# Patient Record
Sex: Female | Born: 1935 | Race: Black or African American | Hispanic: No | Marital: Single | Smoking: Former smoker
Health system: Southern US, Community
[De-identification: ages and names within clinical notes are randomized; demographics above are authoritative.]

## PROBLEM LIST (undated history)

## (undated) DIAGNOSIS — E119 Type 2 diabetes mellitus without complications: Secondary | ICD-10-CM

## (undated) DIAGNOSIS — F0390 Unspecified dementia without behavioral disturbance: Secondary | ICD-10-CM

## (undated) DIAGNOSIS — I10 Essential (primary) hypertension: Secondary | ICD-10-CM

---

## 2013-04-05 DIAGNOSIS — I422 Other hypertrophic cardiomyopathy: Secondary | ICD-10-CM | POA: Insufficient documentation

## 2017-01-17 DIAGNOSIS — E785 Hyperlipidemia, unspecified: Secondary | ICD-10-CM | POA: Diagnosis present

## 2018-02-20 DIAGNOSIS — E11319 Type 2 diabetes mellitus with unspecified diabetic retinopathy without macular edema: Secondary | ICD-10-CM | POA: Diagnosis present

## 2018-02-20 DIAGNOSIS — I10 Essential (primary) hypertension: Secondary | ICD-10-CM | POA: Insufficient documentation

## 2018-05-29 DIAGNOSIS — E0842 Diabetes mellitus due to underlying condition with diabetic polyneuropathy: Secondary | ICD-10-CM | POA: Insufficient documentation

## 2019-01-15 DIAGNOSIS — D631 Anemia in chronic kidney disease: Secondary | ICD-10-CM | POA: Diagnosis present

## 2019-06-14 DIAGNOSIS — M4800 Spinal stenosis, site unspecified: Secondary | ICD-10-CM | POA: Diagnosis present

## 2020-10-10 ENCOUNTER — Inpatient Hospital Stay (HOSPITAL_COMMUNITY)
Admission: EM | Admit: 2020-10-10 | Discharge: 2020-10-15 | DRG: 638 | Disposition: A | Discharge status: Skilled Nursing Facility | Payer: Medicare Other | Attending: Internal Medicine | Admitting: Internal Medicine

## 2020-10-10 ENCOUNTER — Other Ambulatory Visit: Payer: Self-pay

## 2020-10-10 DIAGNOSIS — F0390 Unspecified dementia without behavioral disturbance: Secondary | ICD-10-CM | POA: Diagnosis present

## 2020-10-10 DIAGNOSIS — I129 Hypertensive chronic kidney disease with stage 1 through stage 4 chronic kidney disease, or unspecified chronic kidney disease: Secondary | ICD-10-CM | POA: Diagnosis present

## 2020-10-10 DIAGNOSIS — Z7982 Long term (current) use of aspirin: Secondary | ICD-10-CM

## 2020-10-10 DIAGNOSIS — N390 Urinary tract infection, site not specified: Secondary | ICD-10-CM | POA: Diagnosis present

## 2020-10-10 DIAGNOSIS — Z66 Do not resuscitate: Secondary | ICD-10-CM | POA: Diagnosis present

## 2020-10-10 DIAGNOSIS — R739 Hyperglycemia, unspecified: Secondary | ICD-10-CM

## 2020-10-10 DIAGNOSIS — F03918 Unspecified dementia, unspecified severity, with other behavioral disturbance: Secondary | ICD-10-CM | POA: Diagnosis present

## 2020-10-10 DIAGNOSIS — E785 Hyperlipidemia, unspecified: Secondary | ICD-10-CM | POA: Diagnosis present

## 2020-10-10 DIAGNOSIS — Z823 Family history of stroke: Secondary | ICD-10-CM

## 2020-10-10 DIAGNOSIS — R011 Cardiac murmur, unspecified: Secondary | ICD-10-CM | POA: Diagnosis present

## 2020-10-10 DIAGNOSIS — G8929 Other chronic pain: Secondary | ICD-10-CM | POA: Diagnosis present

## 2020-10-10 DIAGNOSIS — E1142 Type 2 diabetes mellitus with diabetic polyneuropathy: Secondary | ICD-10-CM | POA: Diagnosis present

## 2020-10-10 DIAGNOSIS — E1122 Type 2 diabetes mellitus with diabetic chronic kidney disease: Secondary | ICD-10-CM | POA: Diagnosis present

## 2020-10-10 DIAGNOSIS — N179 Acute kidney failure, unspecified: Secondary | ICD-10-CM | POA: Diagnosis present

## 2020-10-10 DIAGNOSIS — R8281 Pyuria: Secondary | ICD-10-CM

## 2020-10-10 DIAGNOSIS — E1165 Type 2 diabetes mellitus with hyperglycemia: Principal | ICD-10-CM | POA: Diagnosis present

## 2020-10-10 DIAGNOSIS — Z79899 Other long term (current) drug therapy: Secondary | ICD-10-CM

## 2020-10-10 DIAGNOSIS — N1832 Chronic kidney disease, stage 3b: Secondary | ICD-10-CM | POA: Diagnosis present

## 2020-10-10 DIAGNOSIS — I421 Obstructive hypertrophic cardiomyopathy: Secondary | ICD-10-CM | POA: Diagnosis present

## 2020-10-10 DIAGNOSIS — R112 Nausea with vomiting, unspecified: Secondary | ICD-10-CM

## 2020-10-10 DIAGNOSIS — Z20822 Contact with and (suspected) exposure to covid-19: Secondary | ICD-10-CM | POA: Diagnosis present

## 2020-10-10 DIAGNOSIS — E861 Hypovolemia: Secondary | ICD-10-CM | POA: Diagnosis present

## 2020-10-10 DIAGNOSIS — R001 Bradycardia, unspecified: Secondary | ICD-10-CM | POA: Diagnosis present

## 2020-10-10 DIAGNOSIS — R319 Hematuria, unspecified: Secondary | ICD-10-CM | POA: Diagnosis present

## 2020-10-10 DIAGNOSIS — T383X6A Underdosing of insulin and oral hypoglycemic [antidiabetic] drugs, initial encounter: Secondary | ICD-10-CM | POA: Diagnosis present

## 2020-10-10 DIAGNOSIS — N183 Chronic kidney disease, stage 3 unspecified: Secondary | ICD-10-CM | POA: Diagnosis present

## 2020-10-10 DIAGNOSIS — M5416 Radiculopathy, lumbar region: Secondary | ICD-10-CM | POA: Diagnosis present

## 2020-10-10 LAB — COMPREHENSIVE METABOLIC PANEL
ALT: 19 U/L (ref 0–44)
AST: 18 U/L (ref 15–41)
Albumin: 3.6 g/dL (ref 3.5–5.0)
Alkaline Phosphatase: 87 U/L (ref 38–126)
Anion gap: 13 (ref 5–15)
BUN: 21 mg/dL (ref 8–23)
CO2: 22 mmol/L (ref 22–32)
Calcium: 9.7 mg/dL (ref 8.9–10.3)
Chloride: 95 mmol/L — ABNORMAL LOW (ref 98–111)
Creatinine, Ser: 1.63 mg/dL — ABNORMAL HIGH (ref 0.44–1.00)
GFR, Estimated: 31 mL/min — ABNORMAL LOW (ref >60)
Glucose, Bld: 641 mg/dL (ref 70–99)
Potassium: 4.5 mmol/L (ref 3.5–5.1)
Sodium: 130 mmol/L — ABNORMAL LOW (ref 135–145)
Total Bilirubin: 0.6 mg/dL (ref 0.3–1.2)
Total Protein: 7.4 g/dL (ref 6.5–8.1)

## 2020-10-10 LAB — CBC WITH DIFFERENTIAL/PLATELET
Abs Immature Granulocytes: 0.06 10*3/uL (ref 0.00–0.07)
Basophils Absolute: 0 10*3/uL (ref 0.0–0.1)
Basophils Relative: 0 %
Eosinophils Absolute: 0.1 10*3/uL (ref 0.0–0.5)
Eosinophils Relative: 2 %
HCT: 42.6 % (ref 36.0–46.0)
Hemoglobin: 13.6 g/dL (ref 12.0–15.0)
Immature Granulocytes: 1 %
Lymphocytes Relative: 22 %
Lymphs Abs: 1.5 10*3/uL (ref 0.7–4.0)
MCH: 28.3 pg (ref 26.0–34.0)
MCHC: 31.9 g/dL (ref 30.0–36.0)
MCV: 88.6 fL (ref 80.0–100.0)
Monocytes Absolute: 0.7 10*3/uL (ref 0.1–1.0)
Monocytes Relative: 11 %
Neutro Abs: 4.6 10*3/uL (ref 1.7–7.7)
Neutrophils Relative %: 64 %
Platelets: 270 10*3/uL (ref 150–400)
RBC: 4.81 MIL/uL (ref 3.87–5.11)
RDW: 13.5 % (ref 11.5–15.5)
WBC: 7 10*3/uL (ref 4.0–10.5)
nRBC: 0 % (ref 0.0–0.2)

## 2020-10-10 LAB — URINALYSIS, ROUTINE W REFLEX MICROSCOPIC
Bilirubin Urine: NEGATIVE
Glucose, UA: >=500 mg/dL — AB
Hgb urine dipstick: NEGATIVE
Ketones, ur: NEGATIVE mg/dL
Nitrite: NEGATIVE
Protein, ur: NEGATIVE mg/dL
RBC / HPF: >50 RBC/hpf — ABNORMAL HIGH (ref 0–5)
Specific Gravity, Urine: 1.025 (ref 1.005–1.030)
WBC, UA: >50 WBC/hpf — ABNORMAL HIGH (ref 0–5)
pH: 5 (ref 5.0–8.0)

## 2020-10-10 LAB — GLUCOSE, CAPILLARY
Glucose-Capillary: 210 mg/dL — ABNORMAL HIGH (ref 70–99)
Glucose-Capillary: 211 mg/dL — ABNORMAL HIGH (ref 70–99)

## 2020-10-10 LAB — HEMOGLOBIN A1C
Hgb A1c MFr Bld: 10.8 % — ABNORMAL HIGH (ref 4.8–5.6)
Mean Plasma Glucose: 263.26 mg/dL

## 2020-10-10 LAB — CBG MONITORING, ED
Glucose-Capillary: 234 mg/dL — ABNORMAL HIGH (ref 70–99)
Glucose-Capillary: 119 mg/dL — ABNORMAL HIGH (ref 70–99)
Glucose-Capillary: 107 mg/dL — ABNORMAL HIGH (ref 70–99)
Glucose-Capillary: 231 mg/dL — ABNORMAL HIGH (ref 70–99)

## 2020-10-10 LAB — RESP PANEL BY RT-PCR (FLU A&B, COVID) ARPGX2
Influenza A by PCR: NEGATIVE
Influenza B by PCR: NEGATIVE
SARS Coronavirus 2 by RT PCR: NEGATIVE

## 2020-10-10 MED ORDER — SODIUM CHLORIDE 0.9 % IV BOLUS
500.0000 mL | Freq: Once | INTRAVENOUS | Status: AC
  Administered 2020-10-10: 500 mL via INTRAVENOUS

## 2020-10-10 MED ORDER — DULOXETINE HCL 60 MG PO CPEP
60.0000 mg | ORAL_CAPSULE | Freq: Every day | ORAL | Status: DC
  Administered 2020-10-10 – 2020-10-15 (×6): 60 mg via ORAL
  Filled 2020-10-10 (×6): qty 1

## 2020-10-10 MED ORDER — CEPHALEXIN 250 MG PO CAPS
500.0000 mg | ORAL_CAPSULE | Freq: Once | ORAL | Status: AC
  Administered 2020-10-10: 500 mg via ORAL
  Filled 2020-10-10: qty 2

## 2020-10-10 MED ORDER — LACTATED RINGERS IV SOLN: INTRAVENOUS | Status: DC

## 2020-10-10 MED ORDER — SODIUM CHLORIDE 0.9 % IV SOLN
1.0000 g | Freq: Once | INTRAVENOUS | Status: AC
  Administered 2020-10-10: 1 g via INTRAVENOUS
  Filled 2020-10-10: qty 10

## 2020-10-10 MED ORDER — ACETAMINOPHEN 650 MG RE SUPP: 650.0000 mg | Freq: Four times a day (QID) | RECTAL | Status: DC | PRN

## 2020-10-10 MED ORDER — ENOXAPARIN SODIUM 30 MG/0.3ML ~~LOC~~ SOLN
30.0000 mg | SUBCUTANEOUS | Status: DC
  Administered 2020-10-10 – 2020-10-15 (×6): 30 mg via SUBCUTANEOUS
  Filled 2020-10-10 (×6): qty 0.3

## 2020-10-10 MED ORDER — HYDRALAZINE HCL 25 MG PO TABS
25.0000 mg | ORAL_TABLET | Freq: Three times a day (TID) | ORAL | Status: DC
  Administered 2020-10-10 – 2020-10-15 (×17): 25 mg via ORAL
  Filled 2020-10-10 (×17): qty 1

## 2020-10-10 MED ORDER — INSULIN ASPART 100 UNIT/ML ~~LOC~~ SOLN
15.0000 [IU] | Freq: Once | SUBCUTANEOUS | Status: AC
  Administered 2020-10-10: 15 [IU] via SUBCUTANEOUS

## 2020-10-10 MED ORDER — ONDANSETRON HCL 4 MG PO TABS: 4.0000 mg | ORAL_TABLET | Freq: Four times a day (QID) | ORAL | Status: DC | PRN

## 2020-10-10 MED ORDER — ONDANSETRON HCL 4 MG/2ML IJ SOLN: 4.0000 mg | Freq: Four times a day (QID) | INTRAMUSCULAR | Status: DC | PRN

## 2020-10-10 MED ORDER — FENTANYL CITRATE (PF) 100 MCG/2ML IJ SOLN: 50.0000 ug | Freq: Once | INTRAMUSCULAR | Status: DC

## 2020-10-10 MED ORDER — LOSARTAN POTASSIUM 50 MG PO TABS: 100.0000 mg | ORAL_TABLET | Freq: Every day | ORAL | Status: DC

## 2020-10-10 MED ORDER — ACETAMINOPHEN 325 MG PO TABS
650.0000 mg | ORAL_TABLET | Freq: Four times a day (QID) | ORAL | Status: DC | PRN
  Administered 2020-10-10 – 2020-10-15 (×10): 650 mg via ORAL
  Filled 2020-10-10 (×11): qty 2

## 2020-10-10 MED ORDER — INSULIN ASPART 100 UNIT/ML ~~LOC~~ SOLN
0.0000 [IU] | Freq: Three times a day (TID) | SUBCUTANEOUS | Status: DC
  Administered 2020-10-10: 5 [IU] via SUBCUTANEOUS
  Administered 2020-10-11: 3 [IU] via SUBCUTANEOUS
  Administered 2020-10-11: 8 [IU] via SUBCUTANEOUS
  Administered 2020-10-11: 2 [IU] via SUBCUTANEOUS
  Administered 2020-10-12: 8 [IU] via SUBCUTANEOUS
  Administered 2020-10-12: 3 [IU] via SUBCUTANEOUS
  Administered 2020-10-12 – 2020-10-13 (×2): 11 [IU] via SUBCUTANEOUS
  Administered 2020-10-13: 3 [IU] via SUBCUTANEOUS
  Administered 2020-10-14 (×2): 11 [IU] via SUBCUTANEOUS
  Administered 2020-10-14 – 2020-10-15 (×2): 3 [IU] via SUBCUTANEOUS
  Administered 2020-10-15: 11 [IU] via SUBCUTANEOUS
  Administered 2020-10-15: 5 [IU] via SUBCUTANEOUS

## 2020-10-10 MED ORDER — ATORVASTATIN CALCIUM 40 MG PO TABS
40.0000 mg | ORAL_TABLET | Freq: Every evening | ORAL | Status: DC
  Administered 2020-10-10 – 2020-10-15 (×6): 40 mg via ORAL
  Filled 2020-10-10 (×6): qty 1

## 2020-10-10 MED ORDER — ONDANSETRON HCL 4 MG/2ML IJ SOLN
4.0000 mg | Freq: Once | INTRAMUSCULAR | Status: AC
  Administered 2020-10-10: 4 mg via INTRAVENOUS
  Filled 2020-10-10: qty 2

## 2020-10-10 MED ORDER — CARVEDILOL 12.5 MG PO TABS: 6.2500 mg | ORAL_TABLET | Freq: Two times a day (BID) | ORAL | Status: DC

## 2020-10-10 MED ORDER — CIPROFLOXACIN HCL 500 MG PO TABS
500.0000 mg | ORAL_TABLET | Freq: Every day | ORAL | Status: DC
  Administered 2020-10-11: 500 mg via ORAL
  Filled 2020-10-10: qty 1

## 2020-10-10 MED ORDER — SPIRONOLACTONE 25 MG PO TABS: 50.0000 mg | ORAL_TABLET | Freq: Every day | ORAL | Status: DC

## 2020-10-10 MED ORDER — METOCLOPRAMIDE HCL 5 MG/ML IJ SOLN: 10.0000 mg | INTRAMUSCULAR | Status: DC

## 2020-10-10 NOTE — H&P (Signed)
NAME:  BRYLEY CHRISMAN, MRN:  [id number], DOB:  10/18/1936, LOS: 0 ADMISSION DATE:  10/10/2020, Primary: Patient, No Pcp Per  CHIEF COMPLAINT: Urinary frequency and urgency  Medical Service: Internal Medicine Teaching Service         Attending Physician: Dr. Rebeca Alert Raynaldo Opitz, MD    First Contact: Dr. Darrick Meigs Pager: [phone]  Second Contact: Dr. Alfonse Spruce Pager: [phone]       After Hours (After 5p/  First Contact Pager: [phone]  weekends / holidays): Second Contact Pager: [phone]    History of present illness   Mckaylee Dimalanta is a 84 year old female with past medical history of type 2 diabetes, hypertension, hyperlipidemia, lumbar radiculopathy, seizure, CKD 3, peripheral neuropathy who presents to the ED for polyuria and polydipsia for the past week.  Her friend Shelbie Proctor, also her POA, states that patient can drink up to 10 bottles of Gatorade a day.  Sedation also use the bathroom so frequently that she has to wear a pad.  She denies dysuria or burning sensation with urination or suprapubic tenderness.  Also denies chest pain, shortness of breath.  Complains abdominal pain in the past but not right now.  Per Shelbie Proctor, patient has not not been taking her Trulicity at home for a long time.  Patient states that they has not sent the medication to her house after the hospitalization September.  Patient also complains of generalized weakness that has been going on for more than 1 month, worse in her legs, and denies focal neurological deficits.  She also complains of nausea that has been going on for 1 month, and has gotten progressively worse in the last 2 days.  She has vomited 4 times in the ED but is unsure if her emesis is bloody.  Some of the history was obtained from [place].  She states that patient is from [place] and was moved to [place] recently to live with her.  Patient lives in the independent living facility with 12 hours of care every day.  However, patient still requires a lot  of assistance for daily activities.  Patient refuses to ambulate and require assistance from [place].  Patient's memory has also been worse per [place].  Her last PCP visit was just a few days ago at [facility].  They did not have time to discuss with the doctor about her medications because it was late to their appointment.  And states that her new PCP has put in order order for physical therapy.  Per chart review, patient was admitted to [facility] on 9/22/121 for bradycardia and syncopal episode.  CT head and CT cervical spine were unremarkable.  Her Coreg was decreased from 25 to 6.25 mg daily, losartan decreased 100 mg of 50 mg daily, and hydralazine 25 mg which continue.  In the ED, UA shows large leukocytes, with hematuria, pyuria and rare bacteria.  Glucose admission was 641 and improved to 119 after 15 units of NovoLog.  Past Medical History  HTN, HLD, T2DM with peripheral neuropathy  Home Medications     Prior to Admission medications   Medication Sig Start Date End Date Taking? Authorizing Provider  aspirin 81 MG EC tablet Take 1 tablet by mouth daily. 04/20/16  Yes [provider]  atorvastatin (LIPITOR) 20 MG tablet Take 40 mg by mouth every evening. 07/28/16  Yes [provider]  calcium carbonate (OSCAL) 1500 (600 Ca) MG TABS tablet Take 1 tablet by mouth 2 (two) times daily.   Yes [provider]  carvedilol (COREG) 25 MG tablet Take 1 tablet by mouth in the morning and at bedtime.  08/07/20  Yes [provider]  Cholecalciferol 50 MCG (2000 UT) CAPS Take 1 capsule by mouth daily.   Yes [provider]  Docusate Sodium (DSS) 100 MG CAPS Take 1 capsule by mouth daily. 06/03/20  Yes [provider]  donepezil (ARICEPT) 5 MG tablet Take 5 mg by mouth at bedtime.   Yes [provider]  DULoxetine (CYMBALTA) 60 MG capsule Take 1 capsule by mouth daily. 09/15/20  Yes [provider]  furosemide (LASIX) 20 MG tablet  Take 20 mg by mouth.   Yes [provider]  glipiZIDE (GLUCOTROL XL) 2.5 MG 24 hr tablet Take 2.5 mg by mouth daily. 01/01/19  Yes [provider]  hydrALAZINE (APRESOLINE) 25 MG tablet Take 25 mg by mouth 3 (three) times daily. 01/10/19  Yes [provider]  losartan (COZAAR) 100 MG tablet Take 1 tablet by mouth daily. 02/13/18  Yes [provider]  spironolactone (ALDACTONE) 50 MG tablet Take 1 tablet by mouth daily. 09/11/17  Yes [provider]  Dulaglutide 1.5 MG/0.5ML SOPN Inject 1.5 mg into the skin once a week. Every Friday Patient not taking: Reported on 10/10/2020    [provider]  oxyCODONE-acetaminophen (PERCOCET/ROXICET) 5-325 MG tablet Take 1 tablet by mouth 2 (two) times daily as needed. 06/06/19   [provider]    Allergies    Allergies as of 10/10/2020 - Review Complete 10/10/2020  Allergen Reaction Noted  . Penicillins  10/10/2020    Social History     Denies alcohol since December No cigarette smoking No drug use  Family History   Father: Stroke Mother: Tuberculosis  ROS  Negative unless stated in HPI  Objective   Blood pressure 135/66, pulse 66, temperature 97.9 F (36.6 C), temperature source Oral, resp. rate (!) 21, height 5\' 2"  (1.575 m), weight 74.4 kg, SpO2 97 %.    Filed Weights   10/10/20 0225  Weight: 74.4 kg    Examination: GENERAL: in no acute distress, pleasant appearance HEENT: head atraumatic. No conjunctival injection. Nares patent.  CARDIAC: heart RRR. No peripheral edema.  PULMONARY: acyanotic. Lung sounds clear to auscultation. ABDOMEN: soft. Nontender to palpation.  Nondistended.  Negative for CVA tenderness NEURO: PERRLA, normal EOM, CN II-XII  Intact Right UE: 5/5 strength Left UE: 4/5 strength Right and left LE:4-5/5 strength SKIN: She has a round, shallow-based, crusted lesion on the left ankle.  No purulent discharge, no erythematous, not warm to touch.  No other  wounds noted on bilateral feet PSYCH: A/Ox3. Normal affect   Labs    CBC Latest Ref Rng & Units 10/10/2020  WBC 4.0 - 10.5 K/uL 7.0  Hemoglobin 12.0 - 15.0 g/dL 13.6  Hematocrit 36 - 46 % 42.6  Platelets 150 - 400 K/uL 270   BMP Latest Ref Rng & Units 10/10/2020  Glucose 70 - 99 mg/dL 641(HH)  BUN 8 - 23 mg/dL 21  Creatinine 0.44 - 1.00 mg/dL 1.63(H)  Sodium 135 - 145 mmol/L 130(L)  Potassium 3.5 - 5.1 mmol/L 4.5  Chloride 98 - 111 mmol/L 95(L)  CO2 22 - 32 mmol/L 22  Calcium 8.9 - 10.3 mg/dL 9.7     Summary  45 yof presenting with dysuria, nausea and vomiting who is being admitted to IMTS for overnight observation to monitor volume status and manage hyperglycemia.  Assessment & Plan:  Active Problems:   UTI (urinary tract  infection)  Uncomplicated urinary tract infection. Afebrile, no leukocytosis. Received one dose of rocephin in ED Nausea/vomitting--no evidence of DKA.  Suspect gastroparesis in the setting of diabetes Polyuria/polydipsia-this is likely due to hypoglycemia event secondary to not taking her medications. Plan -Received one dose of rocephin in the ED but will transition to oral antibiotics tomorrow. Will have to use cipro (renally dosed) due to contraindication to Macrobid or bactrim given her renal function and documented allergy to penicillins. -follow urine culture -zofran for nausea -Will give her some IVF given poor oral intake due to n/v   Type II Diabetes Mellitus with hyperglycemia complicated by peripheral neuropathy. Glucose on admission is 641. Last A1C 6.1. reportedly non-compliant with trulicity and glipizide at home however given her last A1C, I question if the hyperglycemia is not a result of UTI. Glucose dropped quickly from [phone] with 15U novolog Plan -Will start with a moderate sliding scale for now given her n/v  -hold trulicity until we get a better sense of where her glucoses are. Recommend discontinuing glipizide -duloxetine  for diabetic neuropathy -recommend close outpatient follow up with PCP. A1C goal <8.   Hypertension/ hyperlipidemia.  Hx of bradycardia Plan: Continue lipitor, coreg, hydralazine, spironolactone. Of note, coreg recently decreased to 6.25mg  BID from 25mg  due to bradycardia and orthostasis. She is also on aspirin at home but unclear of the indication. -Will hold Coreg due to bradycardia.  Heart rate in the room was in the 40s-50s   Stage IV CKD. Baseline GFR around 28. Avoid nephrotoxic agents. Repeat BMP in AM to follow renal function. -Holding losartan, spironolactone and Lasix in the setting of AKI   Best practice:  CODE STATUS: DNR Diet: CM DVT for prophylaxis: lovenox Social considerations/Family communication: family updated at bedside Dispo: Admit patient to Observation with expected length of stay less than 2 midnights.   Gaylan Gerold, DO Internal Medicine Residency My pager: [phone]

## 2020-10-10 NOTE — ED Triage Notes (Signed)
Pt said she has been having urination issues and burning. Pt said pain is in her back area. Pt said urination frequency. Pt said no blood in urine. Pt said 7/10 pain

## 2020-10-10 NOTE — ED Notes (Signed)
Pt provided 2 packs graham crackers, 1 container peanut butter, 4 oz orange juice.

## 2020-10-10 NOTE — ED Notes (Signed)
PT in working with patient

## 2020-10-10 NOTE — ED Notes (Signed)
Pt lunch tray still not on unit

## 2020-10-10 NOTE — Evaluation (Signed)
Physical Therapy Evaluation Patient Details Name: Holly Valdez MRN: [id number] DOB: [birth year] Today's Date: 10/10/2020   History of Present Illness  84yo female c/o polyuria/polydipsia, general weakness, nausea/vomiting. Admitted with UTI. PMH HTN, HLD, DM with peripheral neuropathy  Clinical Impression   Patient received in bed, very pleasantly confused; very difficult to convince her to participate in mobility today. Eventually able to get her to roll side to side to change out damp linens beneath her; did attempt to get to EOB but will need +2 assist due to gross weakness and physical resistance. Left positioned to comfort on ED stretcher with all needs met, RN aware of patient status. In definite need of SNF moving forward.     Follow Up Recommendations SNF;Supervision/Assistance - 24 hour    Equipment Recommendations  Rolling walker with 5" wheels;3in1 (PT);Wheelchair (measurements PT);Wheelchair cushion (measurements PT)    Recommendations for Other Services       Precautions / Restrictions Precautions Precautions: Fall;Other (comment) Precaution Comments: chronic back pain Restrictions Weight Bearing Restrictions: No      Mobility  Bed Mobility Overal bed mobility: Needs Assistance Bed Mobility: Rolling Rolling: Min assist         General bed mobility comments: MinA to completely roll onto her side with use of rail, attempted supine to sit but she declined and physically resisted    Transfers                 General transfer comment: unable- will need +2  Ambulation/Gait             General Gait Details: deferred  Stairs            Wheelchair Mobility    Modified Rankin (Stroke Patients Only)       Balance Overall balance assessment: History of Falls                                           Pertinent Vitals/Pain Pain Assessment: Faces Faces Pain Scale: Hurts little more Pain Location: back pain Pain  Descriptors / Indicators: Aching;Discomfort Pain Intervention(s): Limited activity within patient's tolerance;Monitored during session    Home Living Family/patient expects to be discharged to:: Private residence Living Arrangements: Alone Available Help at Discharge: Family;Other (Comment);Neighbor (has 2 brothers- one lives in [place], one lives in [place]; neighbors are wonderful and can help her as needed) Type of Home: House Home Access: Stairs to enter Entrance Stairs-Rails: Left Entrance Stairs-Number of Steps: L ascending rail Home Layout: One level Home Equipment: Grab bars - tub/shower;Walker - 4 wheels;Cane - quad Additional Comments: has had 4 falls in the past 3 months    Prior Function Level of Independence: Independent with assistive device(s)         Comments: tells me this information, however per caregiver in chart she lives in an independent living faciilty and needs 12 hours of care per day/refuses to ambulate with staff     Hand Dominance        Extremity/Trunk Assessment   Upper Extremity Assessment Upper Extremity Assessment: Generalized weakness    Lower Extremity Assessment Lower Extremity Assessment: Generalized weakness    Cervical / Trunk Assessment Cervical / Trunk Assessment: Kyphotic  Communication   Communication: HOH  Cognition Arousal/Alertness: Awake/alert Behavior During Therapy: Flat affect Overall Cognitive Status: No family/caregiver present to determine baseline cognitive functioning Area of Impairment: Orientation;Attention;Memory;Following commands;Awareness;Safety/judgement;Problem solving  Orientation Level: Disoriented to;Place;Time;Situation Current Attention Level: Sustained Memory: Decreased short-term memory Following Commands: Follows one step commands inconsistently Safety/Judgement: Decreased awareness of safety Awareness: Intellectual Problem Solving: Slow processing;Decreased initiation;Difficulty  sequencing;Requires verbal cues;Requires tactile cues General Comments: very pleasantly confused but a very inaccurate historian and very difficult to convince her to participate in mobility      General Comments General comments (skin integrity, edema, etc.): unable to get her to edge of bed for balance asssesment with +1 today    Exercises     Assessment/Plan    PT Assessment Patient needs continued PT services  PT Problem List Decreased strength;Decreased cognition;Decreased knowledge of use of DME;Decreased activity tolerance;Decreased safety awareness;Decreased balance;Decreased mobility;Decreased coordination       PT Treatment Interventions DME instruction;Balance training;Gait training;Cognitive remediation;Functional mobility training;Patient/family education;Therapeutic activities;Therapeutic exercise;Wheelchair mobility training    PT Goals (Current goals can be found in the Care Plan section)  Acute Rehab PT Goals PT Goal Formulation: Patient unable to participate in goal setting Time For Goal Achievement: 10/24/20 Potential to Achieve Goals: Fair    Frequency Min 2X/week   Barriers to discharge        Co-evaluation               AM-PAC PT "6 Clicks" Mobility  Outcome Measure Help needed turning from your back to your side while in a flat bed without using bedrails?: A Little Help needed moving from lying on your back to sitting on the side of a flat bed without using bedrails?: A Lot Help needed moving to and from a bed to a chair (including a wheelchair)?: A Lot Help needed standing up from a chair using your arms (e.g., wheelchair or bedside chair)?: A Lot Help needed to walk in hospital room?: Total Help needed climbing 3-5 steps with a railing? : Total 6 Click Score: 11    End of Session   Activity Tolerance: Patient tolerated treatment well Patient left: in bed;with call bell/phone within reach (ED stretcher) Nurse Communication: Mobility  status PT Visit Diagnosis: Unsteadiness on feet (R26.81);Difficulty in walking, not elsewhere classified (R26.2);Muscle weakness (generalized) (M62.81);History of falling (Z91.81)    Time: 3545-6256 PT Time Calculation (min) (ACUTE ONLY): 29 min   Charges:   PT Evaluation $PT Eval Moderate Complexity: 1 Mod PT Treatments $Therapeutic Activity: 8-22 mins        Windell Norfolk, DPT, PN1   Supplemental Physical Therapist [place]    Pager [phone] Acute Rehab Office [phone]

## 2020-10-10 NOTE — ED Provider Notes (Signed)
[facility] EMERGENCY DEPARTMENT Provider Note   CSN: [id number] Arrival date & time: 10/10/20  0209     History Chief Complaint  Patient presents with  . Urinary Tract Infection    Holly Valdez is a 84 y.o. female.   84 year old female with a history of NIDDM, CKD stage III, HLD, HTN, HOCM, lumbar radiculopathy presents to the emergency department for evaluation of urge incontinence.  States that she has been experiencing urinary frequency with urgency to void.  Is unable to make it to the bathroom in time before she pees on herself.  While triage note references burning with urination, she states the burning that she feels is in her hands and fingers.  Rates her discomfort at 7/10.  No associated fevers, nausea, vomiting, bowel changes.  The history is provided by the patient. No language interpreter was used.       No past medical history on file.  There are no problems to display for this patient.   ** The histories are not reviewed yet. Please review them in the "History" navigator section and refresh this [place].   OB History   No obstetric history on file.     No family history on file.  Social History   Tobacco Use  . Smoking status: Not on file  Substance Use Topics  . Alcohol use: Not on file  . Drug use: Not on file    Home Medications Prior to Admission medications   Medication Sig Start Date End Date Taking? Authorizing Provider  aspirin 81 MG EC tablet Take 1 tablet by mouth daily. 04/20/16  Yes [provider]  atorvastatin (LIPITOR) 20 MG tablet Take 40 mg by mouth every evening. 07/28/16  Yes [provider]  calcium carbonate (OSCAL) 1500 (600 Ca) MG TABS tablet Take 1 tablet by mouth 2 (two) times daily.   Yes [provider]  carvedilol (COREG) 25 MG tablet Take 1 tablet by mouth in the morning and at bedtime.  08/07/20  Yes [provider]  Cholecalciferol 50 MCG (2000 UT) CAPS Take 1  capsule by mouth daily.   Yes [provider]  Docusate Sodium (DSS) 100 MG CAPS Take 1 capsule by mouth daily. 06/03/20  Yes [provider]  donepezil (ARICEPT) 5 MG tablet Take 5 mg by mouth at bedtime.   Yes [provider]  DULoxetine (CYMBALTA) 60 MG capsule Take 1 capsule by mouth daily. 09/15/20  Yes [provider]  furosemide (LASIX) 20 MG tablet Take 20 mg by mouth.   Yes [provider]  glipiZIDE (GLUCOTROL XL) 2.5 MG 24 hr tablet Take 2.5 mg by mouth daily. 01/01/19  Yes [provider]  hydrALAZINE (APRESOLINE) 25 MG tablet Take 25 mg by mouth 3 (three) times daily. 01/10/19  Yes [provider]  losartan (COZAAR) 100 MG tablet Take 1 tablet by mouth daily. 02/13/18  Yes [provider]  spironolactone (ALDACTONE) 50 MG tablet Take 1 tablet by mouth daily. 09/11/17  Yes [provider]  Dulaglutide 1.5 MG/0.5ML SOPN Inject 1.5 mg into the skin once a week. Every Friday Patient not taking: Reported on 10/10/2020    [provider]  oxyCODONE-acetaminophen (PERCOCET/ROXICET) 5-325 MG tablet Take 1 tablet by mouth 2 (two) times daily as needed. 06/06/19   [provider]    Allergies    Penicillins  Review of Systems   Review of Systems  Ten systems reviewed and are negative for acute change,  except as noted in the HPI.    Physical Exam Updated Vital Signs BP 119/65   Pulse 69   Temp 98.2 F (36.8 C) (Oral)   Resp 13   Ht 5\' 2"  (1.575 m)   Wt 74.4 kg   SpO2 95%   BMI 30.00 kg/m   Physical Exam Vitals and nursing note reviewed.  Constitutional:      General: She is not in acute distress.    Appearance: She is well-developed. She is not diaphoretic.     Comments: Nontoxic appearing and in NAD  HENT:     Head: Normocephalic and atraumatic.     Mouth/Throat:     Comments: Mildly dry mm Eyes:     General: No scleral icterus.    Conjunctiva/sclera: Conjunctivae normal.    Cardiovascular:     Rate and Rhythm: Regular rhythm. Bradycardia present.  Pulmonary:     Effort: Pulmonary effort is normal. No respiratory distress.     Comments: Respirations even and unlabored Abdominal:     Palpations: Abdomen is soft.  Musculoskeletal:        General: Normal range of motion.     Cervical back: Normal range of motion.  Skin:    General: Skin is warm and dry.     Coloration: Skin is not pale.     Findings: No erythema or rash.  Neurological:     Mental Status: She is alert and oriented to person, place, and time.     Coordination: Coordination normal.     Comments: GCS 15.  Moving all extremities spontaneously.  Psychiatric:        Behavior: Behavior normal.     ED Results / Procedures / Treatments   Labs (all labs ordered are listed, but only abnormal results are displayed) Labs Reviewed  COMPREHENSIVE METABOLIC PANEL - Abnormal; Notable for the following components:      Result Value   Sodium 130 (*)    Chloride 95 (*)    Glucose, Bld 641 (*)    Creatinine, Ser 1.63 (*)    GFR, Estimated 31 (*)    All other components within normal limits  URINALYSIS, ROUTINE W REFLEX MICROSCOPIC - Abnormal; Notable for the following components:   APPearance HAZY (*)    Glucose, UA >=500 (*)    Leukocytes,Ua LARGE (*)    RBC / HPF >50 (*)    WBC, UA >50 (*)    Bacteria, UA RARE (*)    All other components within normal limits  CBG MONITORING, ED - Abnormal; Notable for the following components:   Glucose-Capillary 234 (*)    All other components within normal limits  CBG MONITORING, ED - Abnormal; Notable for the following components:   Glucose-Capillary 119 (*)    All other components within normal limits  URINE CULTURE  CBC WITH DIFFERENTIAL/PLATELET    EKG None  Radiology No results found.  Procedures Procedures (including critical care time)  Medications Ordered in ED Medications  cefTRIAXone (ROCEPHIN) 1 g in sodium chloride 0.9 % 100 mL  IVPB (1 g Intravenous New Bag/Given 10/10/20 0709)  metoCLOPramide (REGLAN) injection 10 mg (has no administration in time range)  fentaNYL (SUBLIMAZE) injection 50 mcg (has no administration in time range)  sodium chloride 0.9 % bolus 500 mL (0 mLs Intravenous Stopped 10/10/20 0520)  insulin aspart (novoLOG) injection 15 Units (15 Units Subcutaneous Given 10/10/20 0442)  cephALEXin (KEFLEX) capsule 500 mg (500 mg Oral Given 10/10/20 0441)  ondansetron (ZOFRAN) injection 4  mg (4 mg Intravenous Given 10/10/20 1031)    ED Course  I have reviewed the triage vital signs and the nursing notes.  Pertinent labs & imaging results that were available during my care of the patient were reviewed by me and considered in my medical decision making (see chart for details).  Clinical Course as of Oct 10 729  Sat Oct 10, 2020  0430 Patient with hyperglycemia. Seems to be in the setting of medication noncompliance. The patient is unsure of what diabetic medications she should be taking. Last prescriptions from PCP in August note Trulicity weekly and daily glipizide. Friend at bedside cannot recall the last time the patient took her Trulicity. Patient now reports being followed by a new PCP as of 1 month ago, but unable to access outside records for this. Patient unsure if there were any changes to her medications at this visit.   Does appear to have pyuria. Will tx for UTI given c/o urge incontinence, though polyuria and polydipsia can also be from hyperglycemia. No evidence of DKA. IVF and insulin ordered for management of CBG of 641.    [KH]  0617 CBG improving on recheck to 234.   [RX]  4585 Patient continues to experience vomiting despite antiemetics.  Consult placed for unassigned medical admission.  She will be assessed by the internal medicine teaching service in the ED for admission.   [KH]    Clinical Course User Index [KH] Holly Breach, PA-C   MDM Rules/Calculators/A&P                            84 year old female presenting for urinary urgency as well as urge incontinence.  Urgency and frequency may be associated with hyperglycemia as she reports some polydipsia.  No findings concerning for DKA.  Hyperglycemia has improved with IV fluids and insulin.  Does also have findings concerning for urinary tract infection with gross pyuria.  Has been started on IV antibiotics for treatment of UTI.  She did require prolonged hospitalization for UTI in the past.  While patient does not meet criteria for sepsis, she began to experience nausea and vomiting while being monitored in the ED.  This persists despite antiemetics.  Plan for admission for continued management of nausea and vomiting.  Internal medicine teaching service to admit.   Final Clinical Impression(s) / ED Diagnoses Final diagnoses:  Intractable vomiting with nausea, unspecified vomiting type  Hyperglycemia  Pyuria    Rx / DC Orders ED Discharge Orders    None       Holly Breach, PA-C 10/10/20 0732    Orpah Greek, MD 10/11/20 [phone]

## 2020-10-10 NOTE — Progress Notes (Signed)
Patient received to room 5N18.  Arrived via stretcher accompanied by nurse and nurse tech.  Patient awake and oriented x 3 at this time, though has some memory difficulties and it is felt her lack of hearing has a great deal to do with her occasional confusion.  Oriented to staff and to unit routine.  Patient very pleasant with staff.  Advised re:  Safety protocol and bed alarm system (including rationale).  Bed in low position with alarm on, brakes on, and call light within reach.

## 2020-10-10 NOTE — ED Notes (Signed)
Ice water provided to pt; 382mL. Okay per MD Alfonse Spruce

## 2020-10-11 DIAGNOSIS — Z823 Family history of stroke: Secondary | ICD-10-CM | POA: Diagnosis not present

## 2020-10-11 DIAGNOSIS — N1832 Chronic kidney disease, stage 3b: Secondary | ICD-10-CM | POA: Diagnosis present

## 2020-10-11 DIAGNOSIS — E785 Hyperlipidemia, unspecified: Secondary | ICD-10-CM | POA: Diagnosis present

## 2020-10-11 DIAGNOSIS — G8929 Other chronic pain: Secondary | ICD-10-CM | POA: Diagnosis present

## 2020-10-11 DIAGNOSIS — F0390 Unspecified dementia without behavioral disturbance: Secondary | ICD-10-CM | POA: Diagnosis present

## 2020-10-11 DIAGNOSIS — M5416 Radiculopathy, lumbar region: Secondary | ICD-10-CM | POA: Diagnosis present

## 2020-10-11 DIAGNOSIS — R011 Cardiac murmur, unspecified: Secondary | ICD-10-CM | POA: Diagnosis present

## 2020-10-11 DIAGNOSIS — N390 Urinary tract infection, site not specified: Secondary | ICD-10-CM | POA: Diagnosis present

## 2020-10-11 DIAGNOSIS — N179 Acute kidney failure, unspecified: Secondary | ICD-10-CM | POA: Diagnosis present

## 2020-10-11 DIAGNOSIS — I129 Hypertensive chronic kidney disease with stage 1 through stage 4 chronic kidney disease, or unspecified chronic kidney disease: Secondary | ICD-10-CM | POA: Diagnosis present

## 2020-10-11 DIAGNOSIS — E1142 Type 2 diabetes mellitus with diabetic polyneuropathy: Secondary | ICD-10-CM | POA: Diagnosis present

## 2020-10-11 DIAGNOSIS — Z66 Do not resuscitate: Secondary | ICD-10-CM | POA: Diagnosis present

## 2020-10-11 DIAGNOSIS — R112 Nausea with vomiting, unspecified: Secondary | ICD-10-CM | POA: Diagnosis not present

## 2020-10-11 DIAGNOSIS — I10 Essential (primary) hypertension: Secondary | ICD-10-CM | POA: Diagnosis not present

## 2020-10-11 DIAGNOSIS — R001 Bradycardia, unspecified: Secondary | ICD-10-CM | POA: Diagnosis present

## 2020-10-11 DIAGNOSIS — E1122 Type 2 diabetes mellitus with diabetic chronic kidney disease: Secondary | ICD-10-CM | POA: Diagnosis present

## 2020-10-11 DIAGNOSIS — N183 Chronic kidney disease, stage 3 unspecified: Secondary | ICD-10-CM | POA: Diagnosis present

## 2020-10-11 DIAGNOSIS — Z79899 Other long term (current) drug therapy: Secondary | ICD-10-CM | POA: Diagnosis not present

## 2020-10-11 DIAGNOSIS — E861 Hypovolemia: Secondary | ICD-10-CM | POA: Diagnosis present

## 2020-10-11 DIAGNOSIS — Z794 Long term (current) use of insulin: Secondary | ICD-10-CM | POA: Diagnosis not present

## 2020-10-11 DIAGNOSIS — I421 Obstructive hypertrophic cardiomyopathy: Secondary | ICD-10-CM | POA: Diagnosis present

## 2020-10-11 DIAGNOSIS — N1831 Chronic kidney disease, stage 3a: Secondary | ICD-10-CM | POA: Diagnosis not present

## 2020-10-11 DIAGNOSIS — Z20822 Contact with and (suspected) exposure to covid-19: Secondary | ICD-10-CM | POA: Diagnosis present

## 2020-10-11 DIAGNOSIS — R319 Hematuria, unspecified: Secondary | ICD-10-CM | POA: Diagnosis present

## 2020-10-11 DIAGNOSIS — E119 Type 2 diabetes mellitus without complications: Secondary | ICD-10-CM | POA: Diagnosis not present

## 2020-10-11 DIAGNOSIS — F03918 Unspecified dementia, unspecified severity, with other behavioral disturbance: Secondary | ICD-10-CM | POA: Diagnosis present

## 2020-10-11 DIAGNOSIS — E1165 Type 2 diabetes mellitus with hyperglycemia: Secondary | ICD-10-CM | POA: Diagnosis present

## 2020-10-11 DIAGNOSIS — Z7982 Long term (current) use of aspirin: Secondary | ICD-10-CM | POA: Diagnosis not present

## 2020-10-11 DIAGNOSIS — T383X6A Underdosing of insulin and oral hypoglycemic [antidiabetic] drugs, initial encounter: Secondary | ICD-10-CM | POA: Diagnosis present

## 2020-10-11 LAB — CBC
HCT: 39.7 % (ref 36.0–46.0)
Hemoglobin: 12.9 g/dL (ref 12.0–15.0)
MCH: 28.1 pg (ref 26.0–34.0)
MCHC: 32.5 g/dL (ref 30.0–36.0)
MCV: 86.5 fL (ref 80.0–100.0)
Platelets: 247 10*3/uL (ref 150–400)
RBC: 4.59 MIL/uL (ref 3.87–5.11)
RDW: 13.7 % (ref 11.5–15.5)
WBC: 8.7 10*3/uL (ref 4.0–10.5)
nRBC: 0 % (ref 0.0–0.2)

## 2020-10-11 LAB — BASIC METABOLIC PANEL
Anion gap: 11 (ref 5–15)
BUN: 18 mg/dL (ref 8–23)
CO2: 22 mmol/L (ref 22–32)
Calcium: 9.2 mg/dL (ref 8.9–10.3)
Chloride: 104 mmol/L (ref 98–111)
Creatinine, Ser: 1.31 mg/dL — ABNORMAL HIGH (ref 0.44–1.00)
GFR, Estimated: 41 mL/min — ABNORMAL LOW (ref >60)
Glucose, Bld: 166 mg/dL — ABNORMAL HIGH (ref 70–99)
Potassium: 4 mmol/L (ref 3.5–5.1)
Sodium: 137 mmol/L (ref 135–145)

## 2020-10-11 LAB — URINE CULTURE

## 2020-10-11 LAB — GLUCOSE, CAPILLARY
Glucose-Capillary: 166 mg/dL — ABNORMAL HIGH (ref 70–99)
Glucose-Capillary: 256 mg/dL — ABNORMAL HIGH (ref 70–99)
Glucose-Capillary: 127 mg/dL — ABNORMAL HIGH (ref 70–99)
Glucose-Capillary: 249 mg/dL — ABNORMAL HIGH (ref 70–99)

## 2020-10-11 MED ORDER — INSULIN GLARGINE 100 UNIT/ML ~~LOC~~ SOLN
5.0000 [IU] | Freq: Every day | SUBCUTANEOUS | Status: DC
  Administered 2020-10-11: 5 [IU] via SUBCUTANEOUS
  Filled 2020-10-11 (×2): qty 0.05

## 2020-10-11 MED ORDER — LOSARTAN POTASSIUM 50 MG PO TABS: 100.0000 mg | ORAL_TABLET | Freq: Every day | ORAL | Status: DC

## 2020-10-11 MED ORDER — LOSARTAN POTASSIUM 50 MG PO TABS
50.0000 mg | ORAL_TABLET | Freq: Every day | ORAL | Status: DC
  Administered 2020-10-11 – 2020-10-15 (×5): 50 mg via ORAL
  Filled 2020-10-11 (×5): qty 1

## 2020-10-11 MED ORDER — CEPHALEXIN 500 MG PO CAPS
500.0000 mg | ORAL_CAPSULE | Freq: Four times a day (QID) | ORAL | Status: AC
  Administered 2020-10-11 – 2020-10-15 (×14): 500 mg via ORAL
  Filled 2020-10-11 (×14): qty 1

## 2020-10-11 NOTE — Plan of Care (Signed)

## 2020-10-11 NOTE — Evaluation (Signed)
Occupational Therapy Evaluation Patient Details Name: Holly Valdez MRN: [id number] DOB: 1935/12/27 Today's Date: 10/11/2020    History of Present Illness 84yo female c/o polyuria/polydipsia, general weakness, nausea/vomiting. Admitted with UTI. PMH HTN, HLD, DM with peripheral neuropathy   Clinical Impression   Pt is a poor historian, but reports she was living alone with a lot of help, but is not able to describe what kind of help she was receiving or by whom. Pt states she was walking with a cane and has had many falls because of her back. Pt presents with impaired cognition, generalized weakness and impaired standing balance. Recommending SNF upon discharge. Will follow acutely.    Follow Up Recommendations  SNF;Supervision/Assistance - 24 hour    Equipment Recommendations  None recommended by OT    Recommendations for Other Services       Precautions / Restrictions Precautions Precautions: Fall Precaution Comments: chronic back pain Restrictions Weight Bearing Restrictions: No      Mobility Bed Mobility Overal bed mobility: Needs Assistance Bed Mobility: Supine to Sit     Supine to sit: Supervision          Transfers Overall transfer level: Needs assistance Equipment used: 1 person hand held assist Transfers: Sit to/from Stand Sit to Stand: Min assist         General transfer comment: reports she usually uses a cane, hand held assist provided    Balance Overall balance assessment: History of Falls                                         ADL either performed or assessed with clinical judgement   ADL Overall ADL's : Needs assistance/impaired Eating/Feeding: Independent   Grooming: Wash/dry hands;Wash/dry face;Sitting;Set up   Upper Body Bathing: Supervision/ safety;Sitting   Lower Body Bathing: Minimal assistance;Sit to/from stand   Upper Body Dressing : Set up;Sitting   Lower Body Dressing: Minimal assistance;Sit to/from  stand   Toilet Transfer: Minimal assistance;Ambulation   Toileting- Clothing Manipulation and Hygiene: Minimal assistance;Sit to/from stand       Functional mobility during ADLs: Minimal assistance (hand held)       Vision Patient Visual Report: No change from baseline       Perception     Praxis      Pertinent Vitals/Pain Pain Assessment: Faces Faces Pain Scale: Hurts little more Pain Location: back pain Pain Descriptors / Indicators: Aching;Discomfort Pain Intervention(s): Monitored during session;Repositioned     Hand Dominance Right   Extremity/Trunk Assessment Upper Extremity Assessment Upper Extremity Assessment: Overall WFL for tasks assessed   Lower Extremity Assessment Lower Extremity Assessment: Defer to PT evaluation   Cervical / Trunk Assessment Cervical / Trunk Assessment: Kyphotic;Other exceptions Cervical / Trunk Exceptions: chronic back pain   Communication Communication Communication: HOH   Cognition Arousal/Alertness: Awake/alert Behavior During Therapy: WFL for tasks assessed/performed Overall Cognitive Status: Impaired/Different from baseline Area of Impairment: Orientation;Attention;Memory;Awareness;Safety/judgement;Problem solving                 Orientation Level: Disoriented to;Place;Time;Situation Current Attention Level: Selective Memory: Decreased short-term memory   Safety/Judgement: Decreased awareness of deficits Awareness: Intellectual   General Comments: pleasantly confused, states she has been told she has Alzheimers, but thinks her memory is typical of an 84 year old   General Comments       Exercises     Shoulder Instructions  Home Living Family/patient expects to be discharged to:: Private residence Living Arrangements: Alone Available Help at Discharge: Neighbor;Friend(s);Available PRN/intermittently Type of Home: House Home Access: Stairs to enter [employer] of Steps: L ascending  rail Entrance Stairs-Rails: Left Home Layout: One level     Bathroom Shower/Tub: Teacher, early years/pre: Standard     Home Equipment: Grab bars - tub/shower;Walker - 4 wheels;Cane - quad   Additional Comments: has had 4 falls in the past 3 months      Prior Functioning/Environment Level of Independence: Needs assistance  Gait / Transfers Assistance Needed: walks with a cane ADL's / Homemaking Assistance Needed: per friend, pt has become increasingly more dependent on assistance for IADL   Comments: pt thought she was in [place], reports she just moved here 2 weeks ago.        OT Problem List: Decreased cognition;Impaired balance (sitting and/or standing);Decreased knowledge of use of DME or AE;Pain      OT Treatment/Interventions: Self-care/ADL training;DME and/or AE instruction;Cognitive remediation/compensation;Patient/family education;Balance training    OT Goals(Current goals can be found in the care plan section) Acute Rehab OT Goals Patient Stated Goal: to eat her breakfast OT Goal Formulation: With patient Time For Goal Achievement: 10/25/20 Potential to Achieve Goals: Good ADL Goals Pt Will Perform Grooming: (P) with supervision;standing Pt Will Perform Lower Body Bathing: (P) with supervision;sit to/from stand Pt Will Perform Lower Body Dressing: (P) with supervision Pt Will Perform Toileting - Clothing Manipulation and hygiene: (P) with supervision;sit to/from stand  OT Frequency: Min 2X/week   Barriers to D/C: Decreased caregiver support          Co-evaluation              AM-PAC OT "6 Clicks" Daily Activity     Outcome Measure Help from another person eating meals?: None Help from another person taking care of personal grooming?: A Little Help from another person toileting, which includes using toliet, bedpan, or urinal?: A Little Help from another person bathing (including washing, rinsing, drying)?: A Little Help from another  person to put on and taking off regular upper body clothing?: A Little Help from another person to put on and taking off regular lower body clothing?: A Little 6 Click Score: 19   End of Session Equipment Utilized During Treatment: Gait belt Nurse Communication: Mobility status  Activity Tolerance: Patient tolerated treatment well Patient left: in chair;with call bell/phone within reach;with chair alarm set;with nursing/sitter in room  OT Visit Diagnosis: Unsteadiness on feet (R26.81);Other abnormalities of gait and mobility (R26.89);Muscle weakness (generalized) (M62.81);Other symptoms and signs involving cognitive function                Time: 0017-4944 OT Time Calculation (min): 28 min Charges:  OT General Charges $OT Visit: 1 Visit OT Evaluation $OT Eval Moderate Complexity: 1 Mod OT Treatments $Self Care/Home Management : 8-22 mins  Nestor Lewandowsky, OTR/L Acute Rehabilitation Services Pager: [phone] Office: [phone] Malka So 10/11/2020, 9:42 AM

## 2020-10-11 NOTE — Progress Notes (Addendum)
Subjective:   Hospital day: 1  Overnight event: No acute event  Patient is sitting on reclining chair during examination.  She appears comfortable.  She states that she is feeling well and denies nausea/vomiting or dysuria.  States that her peripheral neuropathy is bothering her, especially on her hands.  Patient states that she would like to go home but wants to continue physical therapy.  Objective:  Vital signs in last 24 hours: Vitals:   10/10/20 2338 10/11/20 0436 10/11/20 0755 10/11/20 1453  BP: (!) 136/56 132/76 (!) 155/61 (!) 150/86  Pulse: (!) 59 (!) 55 (!) 58 75  Resp: 16 16 16 17   Temp: 98.6 F (37 C) 98.4 F (36.9 C) 97.9 F (36.6 C) (!) 97.3 F (36.3 C)  TempSrc: Oral Oral Oral Oral  SpO2: 100% 97% 100% 100%  Weight:      Height:       CBC Latest Ref Rng & Units 10/11/2020 10/10/2020  WBC 4.0 - 10.5 K/uL 8.7 7.0  Hemoglobin 12.0 - 15.0 g/dL 12.9 13.6  Hematocrit 36 - 46 % 39.7 42.6  Platelets 150 - 400 K/uL 247 270   CMP Latest Ref Rng & Units 10/11/2020 10/10/2020  Glucose 70 - 99 mg/dL 166(H) 641(HH)  BUN 8 - 23 mg/dL 18 21  Creatinine 0.44 - 1.00 mg/dL 1.31(H) 1.63(H)  Sodium 135 - 145 mmol/L 137 130(L)  Potassium 3.5 - 5.1 mmol/L 4.0 4.5  Chloride 98 - 111 mmol/L 104 95(L)  CO2 22 - 32 mmol/L 22 22  Calcium 8.9 - 10.3 mg/dL 9.2 9.7  Total Protein 6.5 - 8.1 g/dL - 7.4  Total Bilirubin 0.3 - 1.2 mg/dL - 0.6  Alkaline Phos 38 - 126 U/L - 87  AST 15 - 41 U/L - 18  ALT 0 - 44 U/L - 19    Physical Exam  Physical Exam Constitutional:      General: She is not in acute distress. HENT:     Head: Normocephalic.  Eyes:     General:        Right eye: No discharge.        Left eye: No discharge.  Cardiovascular:     Rate and Rhythm: Regular rhythm. Bradycardia present.  Pulmonary:     Effort: No respiratory distress.     Breath sounds: Normal breath sounds.  Abdominal:     General: Bowel sounds are normal.     Palpations: Abdomen is soft.      Tenderness: There is no abdominal tenderness.  Musculoskeletal:     Cervical back: Normal range of motion.     Right lower leg: No edema.     Left lower leg: No edema.  Neurological:     Mental Status: She is alert.  Psychiatric:        Mood and Affect: Mood normal.     Assessment/Plan: 58 yof presenting with polyuria, polydipsia, nausea and vomiting who is being admitted to IMTS for hypovolemia and hyperglycemia.  Active Problems:   UTI (urinary tract infection)   Hypovolemia  Uncomplicated urinary tract infection.  UA shows large leukocyte esterase, hematuria > 50 RBC, pyuria > 50 WBC, rare bacteria.  She has been afebrile, no leukocytosis.  Also denies dysuria.  Given pyuria with polyuria, will treat this uncomplicated UTI with Keflex.  Patient received 1 dose of ceftriaxone in the ED. Polyuria/polydipsia   likely due to hypoglycemia event secondary to not taking her medications. Plan -Transition to Keflex 5 mg every 6  hour -Urine culture shows multiple species, suggest recollection -Her polyuria/polydipsia improved with resolution of hyperglycemia     Type II Diabetes Mellitus with hyperglycemia complicated by peripheral neuropathy.  Hemoglobin 10.8 (was 6.3 in August).  Will add 5 units of Lantus given her elevated CBG. Plan - Lantus 5 units nightly - moderate sliding scale - hold trulicity  - Recommend discontinuing glipizide - duloxetine for diabetic neuropathy - recommend close outpatient follow up with PCP. A1C goal <8.     Nausea/vomitting--no evidence of DKA.  Suspect gastroparesis in the setting of diabetes -zofran for nausea -Outpatient evaluation    Hypertension Hx of bradycardia Patient was hospitalized in September for a syncopal episode and found to be bradycardic.  Her Coreg was decreased from 25 mg daily to 6.25 mg daily, losartan 100 mg to 50 mg daily, and continue hydralazine. It seems like patient is taking the old dose of 25 mg at home. HR  improving with holding Coreg. EKG this PM show normal sinus.  Plan:  -Continue holding Coreg -Restart Losartan 50 mg for HTN     Stage IV CKD.  Baseline Cr 1.2. Creatine improving at 1.3 today.  - D/c fluid - Encourage Po intake -Holding spironolactone and Lasix in the setting of AKI  Diet: HH/CM IVF: NA VTE: Lovenox CODE: DNR  Prior to Admission Living Arrangement: home Anticipated Discharge Location: SNF vs home Barriers to Discharge: medical management   Gaylan Gerold, DO 10/11/2020, 3:58 PM Pager: [phone] After 5pm on weekdays and 1pm on weekends: On Call pager [phone]

## 2020-10-12 LAB — CBC
HCT: 39.4 % (ref 36.0–46.0)
Hemoglobin: 12.7 g/dL (ref 12.0–15.0)
MCH: 27.5 pg (ref 26.0–34.0)
MCHC: 32.2 g/dL (ref 30.0–36.0)
MCV: 85.5 fL (ref 80.0–100.0)
Platelets: 244 10*3/uL (ref 150–400)
RBC: 4.61 MIL/uL (ref 3.87–5.11)
RDW: 13.7 % (ref 11.5–15.5)
WBC: 7.2 10*3/uL (ref 4.0–10.5)
nRBC: 0 % (ref 0.0–0.2)

## 2020-10-12 LAB — GLUCOSE, CAPILLARY
Glucose-Capillary: 188 mg/dL — ABNORMAL HIGH (ref 70–99)
Glucose-Capillary: 314 mg/dL — ABNORMAL HIGH (ref 70–99)
Glucose-Capillary: 262 mg/dL — ABNORMAL HIGH (ref 70–99)
Glucose-Capillary: 112 mg/dL — ABNORMAL HIGH (ref 70–99)

## 2020-10-12 LAB — BASIC METABOLIC PANEL
Anion gap: 9 (ref 5–15)
BUN: 21 mg/dL (ref 8–23)
CO2: 24 mmol/L (ref 22–32)
Calcium: 9.2 mg/dL (ref 8.9–10.3)
Chloride: 102 mmol/L (ref 98–111)
Creatinine, Ser: 1.36 mg/dL — ABNORMAL HIGH (ref 0.44–1.00)
GFR, Estimated: 39 mL/min — ABNORMAL LOW (ref >60)
Glucose, Bld: 161 mg/dL — ABNORMAL HIGH (ref 70–99)
Potassium: 3.9 mmol/L (ref 3.5–5.1)
Sodium: 135 mmol/L (ref 135–145)

## 2020-10-12 MED ORDER — INSULIN GLARGINE 100 UNIT/ML ~~LOC~~ SOLN
10.0000 [IU] | Freq: Every day | SUBCUTANEOUS | Status: DC
  Administered 2020-10-12 – 2020-10-14 (×3): 10 [IU] via SUBCUTANEOUS
  Filled 2020-10-12 (×4): qty 0.1

## 2020-10-12 NOTE — Plan of Care (Signed)

## 2020-10-12 NOTE — Progress Notes (Addendum)
   Subjective:   Hospital day: 2  Overnight event: No acute event  Feeling well this morning. Slept well. No complaints. Denies dysuria.  Patient agrees with going to a rehab facility to get better before going home.  Objective:  Vital signs in last 24 hours: Vitals:   10/11/20 1453 10/11/20 1957 10/12/20 0516 10/12/20 0822  BP: (!) 150/86 129/74 123/80 132/60  Pulse: 75 93 (!) 57 60  Resp: 17 15 16 17   Temp: (!) 97.3 F (36.3 C) 98.1 F (36.7 C) 98 F (36.7 C) 98 F (36.7 C)  TempSrc: Oral Oral Oral Oral  SpO2: 100% 99% 100% 100%  Weight:      Height:        Physical Exam Physical Exam Constitutional:      General: She is not in acute distress. HENT:     Head: Normocephalic.  Eyes:     General:        Right eye: No discharge.        Left eye: No discharge.  Cardiovascular:     Rate and Rhythm: Normal rate and regular rhythm.  Abdominal:     General: Bowel sounds are normal.  Musculoskeletal:     Right lower leg: Edema (trace) present.     Left lower leg: Edema (trace) present.  Skin:    General: Skin is warm.  Neurological:     Mental Status: She is alert.     Assessment/Plan: Holly Valdez is a 7 yof presenting with polyuria, polydipsia, nausea and vomiting who is being admitted to IMTS for hypovolemia and hyperglycemia.  She is medically stable for discharge, pending SNF placement.  Principal Problem:   Acute kidney injury ([place]) Active Problems:   UTI (urinary tract infection)   Hypovolemia   Uncontrolled type 2 diabetes mellitus with hyperglycemia (HCC)   CKD (chronic kidney disease) stage 3, GFR 30-59 ml/min (HCC)   Dementia (HCC)   Uncomplicated urinary tract infection.  Polyuria/polydipsia -resolved likely due to hypoglycemia event secondary to not taking her medications. Plan -continue keflex (day 3/3)   Type II Diabetes Mellitus with hyperglycemia complicated by peripheral neuropathy. Hemoglobin 10.8 (was 6.3 in August).   Patient received 10 units of NovoLog yesterday.  Fasting CBG this morning 188.  Will increase Lantus to 10 units. Plan - Increase Lantus 10 units nightly - moderate sliding scale - hold trulicity  - Recommend discontinuing glipizide - duloxetine for diabetic neuropathy - recommend close outpatient follow up with PCP. A1C goal <8.   Nausea/vomitting resolved Can consider outpatient study for gastroparesis if continue to have nausea vomiting   Hypertension with bradycardia Recommend discontinuing coreg given bradycardia Plan:  -Continue holding Coreg -continue Losartan 50 mg for HTN   Stage IV CKD.  Baseline Cr 1.2. Creatine improving at 1.3 today.  - D/c fluid - Encourage Po intake -Holding spironolactone and Lasix in the setting of AKI   Diet: HH/CM IVF: NA VTE: Lovenox CODE: DNR  Prior to Admission Living Arrangement: home Anticipated Discharge Location: SNF  Barriers to Discharge:  SNF placement  Gaylan Gerold, DO 10/12/2020, 10:29 AM Pager: [phone] After 5pm on weekdays and 1pm on weekends: On Call pager [phone]

## 2020-10-12 NOTE — Progress Notes (Signed)
Inpatient Diabetes Program Recommendations  AACE/ADA: New Consensus Statement on Inpatient Glycemic Control (2015)  Target Ranges:  Prepandial:   less than 140 mg/dL      Peak postprandial:   less than 180 mg/dL (1-2 hours)      Critically ill patients:  140 - 180 mg/dL   Lab Results  Component Value Date   GLUCAP 314 (H) 10/12/2020   HGBA1C 10.8 (H) 10/10/2020    Review of Glycemic Control Results for Holly Valdez, Holly Valdez (MRN [id number]) as of 10/12/2020 14:56  Ref. Range 10/11/2020 11:11 10/11/2020 16:25 10/11/2020 21:10 10/12/2020 06:14 10/12/2020 11:44  Glucose-Capillary Latest Ref Range: 70 - 99 mg/dL 256 (H) 127 (H) 249 (H) 188 (H) 314 (H)   Diabetes history: DM 2 Outpatient Diabetes medications: Trulicity 1.5 mg weekly (patient has not been taking- states she ran out) Current orders for Inpatient glycemic control:  Lantus 10 units daily, Novolog moderate tid with meals  Inpatient Diabetes Program Recommendations:    Spoke with patient regarding elevated A1C.  She states she's been drinking lots of "Fanta" lately but states she is not going to do that anymore.  She also has not been taking her Trulicity stating that she ran out.  Explained that her blood sugars have been elevated and she likely needs this.  Agree with the addition of basal insulin, although if appropriate, may do better to restart Trulicity due to it being once a week and having low risk for hypoglycemia.  Note patient to d/c to SNF initially for rehab.  Likely needs assistance at home with medications and needs f/u with Endocrinology.  We reviewed her A1C and discussed goal blood sugars at home as well.   Thanks  Adah Perl, RN, BC-ADM Inpatient Diabetes Coordinator Pager [phone] (8a-5p)

## 2020-10-13 DIAGNOSIS — N184 Chronic kidney disease, stage 4 (severe): Secondary | ICD-10-CM

## 2020-10-13 LAB — CBC
HCT: 42 % (ref 36.0–46.0)
Hemoglobin: 13.8 g/dL (ref 12.0–15.0)
MCH: 28.1 pg (ref 26.0–34.0)
MCHC: 32.9 g/dL (ref 30.0–36.0)
MCV: 85.5 fL (ref 80.0–100.0)
Platelets: 265 10*3/uL (ref 150–400)
RBC: 4.91 MIL/uL (ref 3.87–5.11)
RDW: 13.8 % (ref 11.5–15.5)
WBC: 7.4 10*3/uL (ref 4.0–10.5)
nRBC: 0 % (ref 0.0–0.2)

## 2020-10-13 LAB — GLUCOSE, CAPILLARY
Glucose-Capillary: 117 mg/dL — ABNORMAL HIGH (ref 70–99)
Glucose-Capillary: 169 mg/dL — ABNORMAL HIGH (ref 70–99)
Glucose-Capillary: 338 mg/dL — ABNORMAL HIGH (ref 70–99)
Glucose-Capillary: 192 mg/dL — ABNORMAL HIGH (ref 70–99)

## 2020-10-13 LAB — BASIC METABOLIC PANEL
Anion gap: 13 (ref 5–15)
BUN: 16 mg/dL (ref 8–23)
CO2: 22 mmol/L (ref 22–32)
Calcium: 9.5 mg/dL (ref 8.9–10.3)
Chloride: 101 mmol/L (ref 98–111)
Creatinine, Ser: 1.33 mg/dL — ABNORMAL HIGH (ref 0.44–1.00)
GFR, Estimated: 40 mL/min — ABNORMAL LOW (ref >60)
Glucose, Bld: 116 mg/dL — ABNORMAL HIGH (ref 70–99)
Potassium: 3.9 mmol/L (ref 3.5–5.1)
Sodium: 136 mmol/L (ref 135–145)

## 2020-10-13 MED ORDER — DULAGLUTIDE 1.5 MG/0.5ML ~~LOC~~ SOAJ: 1.5000 mg | SUBCUTANEOUS | Status: DC

## 2020-10-13 NOTE — NC FL2 (Signed)
[place] LEVEL OF CARE SCREENING TOOL     IDENTIFICATION  Patient Name: Holly Valdez Birthdate: [birth year] Sex: female Admission Date (Current Location): 10/10/2020  [facility] and [place] Number:  Herbalist and Address:  [facility], [place] [address], [place], [place] [address]      Provider Number: [id number]  Attending Physician Name and Address:  Oda Kilts, MD  Relative Name and Phone Number:  Ginger Organ - daughter - [phone]    Current Level of Care: Hospital Recommended Level of Care: [place] Prior Approval Number:    Date Approved/Denied:   PASRR Number: [id number]  Discharge Plan: SNF    Current Diagnoses: Patient Active Problem List   Diagnosis Date Noted   Hypovolemia 10/11/2020   Uncontrolled type 2 diabetes mellitus with hyperglycemia ([place]) 10/11/2020   Acute kidney injury ([place]) 10/11/2020   CKD (chronic kidney disease) stage 3, GFR 30-59 ml/min ([place]) 10/11/2020   Dementia ([place]) 10/11/2020   UTI (urinary tract infection) 10/10/2020    Orientation RESPIRATION BLADDER Height & Weight     Self  Normal External catheter Weight: 75 kg Height:  5\' 2"  (157.5 cm)  BEHAVIORAL SYMPTOMS/MOOD NEUROLOGICAL BOWEL NUTRITION STATUS      Continent Diet (See discharge summary)  AMBULATORY STATUS COMMUNICATION OF NEEDS Skin   Extensive Assist Verbally                         Personal Care Assistance Level of Assistance  Bathing, Feeding, Dressing Bathing Assistance: Limited assistance Feeding assistance: Independent Dressing Assistance: Limited assistance     Functional Limitations Info  Sight, Hearing, Speech Sight Info: Adequate Hearing Info: Impaired Speech Info: Adequate    SPECIAL CARE FACTORS FREQUENCY  PT (By licensed PT), OT (By licensed OT)     PT Frequency: 5 x per week OT Frequency: 5 x per week            Contractures Contractures Info: Not  present    Additional Factors Info  Allergies, Code Status, Psychotropic, Insulin Sliding Scale Code Status Info: DNR Allergies Info: Penicillin Psychotropic Info: Cymbalta Insulin Sliding Scale Info: See discharge summary       Current Medications (10/13/2020):  This is the current hospital active medication list Current Facility-Administered Medications  Medication Dose Route Frequency Provider Last Rate Last Admin   acetaminophen (TYLENOL) tablet 650 mg  650 mg Oral Q6H PRN Darrick Meigs, Rylee, MD   650 mg at 10/13/20 4628   Or   acetaminophen (TYLENOL) suppository 650 mg  650 mg Rectal Q6H PRN Christian, Rylee, MD       atorvastatin (LIPITOR) tablet 40 mg  40 mg Oral QPM Christian, Rylee, MD   40 mg at 10/12/20 1808   cephALEXin (KEFLEX) capsule 500 mg  500 mg Oral Q6H Gaylan Gerold, DO   500 mg at 10/13/20 1208   DULoxetine (CYMBALTA) DR capsule 60 mg  60 mg Oral Daily Christian, Rylee, MD   60 mg at 10/13/20 0942   enoxaparin (LOVENOX) injection 30 mg  30 mg Subcutaneous Q24H Christian, Rylee, MD   30 mg at 10/12/20 1807   hydrALAZINE (APRESOLINE) tablet 25 mg  25 mg Oral TID Mitzi Hansen, MD   25 mg at 10/13/20 0942   insulin aspart (novoLOG) injection 0-15 Units  0-15 Units Subcutaneous TID WC Christian, Rylee, MD   3 Units at 10/13/20 1208   insulin glargine (LANTUS) injection 10  Units  10 Units Subcutaneous QHS Gaylan Gerold, DO   10 Units at 10/12/20 2115   losartan (COZAAR) tablet 50 mg  50 mg Oral Daily Gaylan Gerold, DO   50 mg at 10/13/20 0942   ondansetron (ZOFRAN) tablet 4 mg  4 mg Oral Q6H PRN Mitzi Hansen, MD       Or   ondansetron (ZOFRAN) injection 4 mg  4 mg Intravenous Q6H PRN Mitzi Hansen, MD         Discharge Medications: Please see discharge summary for a list of discharge medications.  Relevant Imaging Results:  Relevant Lab Results:   Additional Information SS# [id number]  Curlene Labrum, RN

## 2020-10-13 NOTE — Progress Notes (Signed)
Subjective:   Hospital day: 3  Overnight event: No acute event  Patient is sitting in recliner chair during examination.  She states that her back pain and leg pain are bothering her.  States that she has history of bad sciatica.  She also complains of abdominal pain that started 2 days ago.  Still endorses dysuria.  Objective:  Vital signs in last 24 hours: Vitals:   10/12/20 0822 10/12/20 1509 10/12/20 2058 10/13/20 0443  BP: 132/60 139/79 130/64 (!) 142/75  Pulse: 60 85 76 95  Resp: 17 20 15 15   Temp: 98 F (36.7 C) 98.2 F (36.8 C) 98.4 F (36.9 C) 98.2 F (36.8 C)  TempSrc: Oral Oral Oral Oral  SpO2: 100% 99% 100% 100%  Weight:      Height:       CBC Latest Ref Rng & Units 10/13/2020 10/12/2020 10/11/2020  WBC 4.0 - 10.5 K/uL 7.4 7.2 8.7  Hemoglobin 12.0 - 15.0 g/dL 13.8 12.7 12.9  Hematocrit 36 - 46 % 42.0 39.4 39.7  Platelets 150 - 400 K/uL 265 244 247   CMP Latest Ref Rng & Units 10/13/2020 10/12/2020 10/11/2020  Glucose 70 - 99 mg/dL 116(H) 161(H) 166(H)  BUN 8 - 23 mg/dL 16 21 18   Creatinine 0.44 - 1.00 mg/dL 1.33(H) 1.36(H) 1.31(H)  Sodium 135 - 145 mmol/L 136 135 137  Potassium 3.5 - 5.1 mmol/L 3.9 3.9 4.0  Chloride 98 - 111 mmol/L 101 102 104  CO2 22 - 32 mmol/L 22 24 22   Calcium 8.9 - 10.3 mg/dL 9.5 9.2 9.2  Total Protein 6.5 - 8.1 g/dL - - -  Total Bilirubin 0.3 - 1.2 mg/dL - - -  Alkaline Phos 38 - 126 U/L - - -  AST 15 - 41 U/L - - -  ALT 0 - 44 U/L - - -     Physical Exam  Physical Exam Constitutional:      General: She is not in acute distress. HENT:     Head: Normocephalic.  Eyes:     General:        Right eye: No discharge.        Left eye: No discharge.  Cardiovascular:     Rate and Rhythm: Normal rate and regular rhythm.     Heart sounds: Murmur (3/6 systolic murmur heard best at the right upper sternal border) heard.   Pulmonary:     Effort: No respiratory distress.  Abdominal:     Comments: Suprapubic tenderness    Musculoskeletal:     Right lower leg: No edema.     Left lower leg: No edema.  Skin:    General: Skin is warm.  Neurological:     Mental Status: She is alert.     Assessment/Plan: Holly Valdez is a 60 yof presenting withpolyuria, polydipsia, nausea and vomiting who is being admitted to IMTS forhypovolemia andhyperglycemia.  Pending SNF placement.  Principal Problem:   Acute kidney injury ([place]) Active Problems:   UTI (urinary tract infection)   Hypovolemia   Uncontrolled type 2 diabetes mellitus with hyperglycemia (HCC)   CKD (chronic kidney disease) stage 3, GFR 30-59 ml/min (HCC)   Dementia (HCC)  Uncomplicated urinary tract infection. She complains of dysuria.  With suprapubic tenderness on exam, we will continue antibiotics for 2 more days. Polyuria/polydipsia-resolved likely due to hypoglycemia event secondary to not taking her medications. Plan -continue keflex (day 4/5)   Type II Diabetes Mellitus with hyperglycemia complicated by peripheral neuropathy.  Hemoglobin 10.8(was 6.3 in August).  Fasting CBG 117 this morning Plan -Continue Lantus 10 units nightly -moderate sliding scale - Will restart her GLP-1 while inpatient.  Appreciate pharmacy assistance -Recommend discontinuing glipizide - duloxetine for diabetic neuropathy - recommend close outpatient follow up with PCP. A1C goal <8.   Nausea/vomitting resolved Can consider outpatient study for gastroparesis if continue to have nausea vomiting   Hypertension  Bradycardia-resolved Plan:  -Continue holding Coreg -continue Losartan 50 mg for HTN   Stage IV CKD.  BaselineCr 1.2. Creatine stable at 1.3.  This may be her new baseline creatinine - Encourage Po intake - Holding spironolactone and Lasix in the setting of AKI   Diet:HH/CM IVF:NA FVO:HKGOVPC CODE:DNR  Prior to Admission Living Arrangement:home Anticipated Discharge Location:SNF  Barriers to Discharge: SNF  placement  Gaylan Gerold, DO 10/13/2020, 6:58 AM Pager: [phone] After 5pm on weekdays and 1pm on weekends: On Call pager [phone]

## 2020-10-13 NOTE — Progress Notes (Signed)
Occupational Therapy Treatment Patient Details Name: Holly Valdez MRN: [id number] DOB: 19-Jul-1936 Today's Date: 10/13/2020    History of present illness 84yo female c/o polyuria/polydipsia, general weakness, nausea/vomiting. Admitted with UTI. PMH HTN, HLD, DM with peripheral neuropathy   OT comments  Pt. Was pleasantly confused during the session. Pt. Has chronic back pain and was 7/10. Pt and caregiver were educated on use of adaptive equipment to assist the LE ADLs and for pain management. Pt. And caregiver were ed on use of reacher, sock donner, long handled sponge brush and long handled shoe horn. Pt. Is progressing with goals and acute OT to continue to follow.   Follow Up Recommendations  SNF;Supervision/Assistance - 24 hour    Equipment Recommendations  None recommended by OT    Recommendations for Other Services      Precautions / Restrictions Precautions Precautions: Fall Precaution Comments: chronic back pain Restrictions Weight Bearing Restrictions: No       Mobility Bed Mobility                  Transfers Overall transfer level: Needs assistance Equipment used: 1 person hand held assist Transfers: Sit to/from Stand Sit to Stand: Min assist              Balance Overall balance assessment: History of Falls                                         ADL either performed or assessed with clinical judgement   ADL Overall ADL's : Needs assistance/impaired Eating/Feeding: Independent   Grooming: Wash/dry hands;Wash/dry face;Sitting;Set up               Lower Body Dressing: Minimal assistance;Sit to/from stand;With adaptive equipment   Toilet Transfer: Minimal assistance;Ambulation   Toileting- Clothing Manipulation and Hygiene: Minimal assistance;Sit to/from stand       Functional mobility during ADLs: Minimal assistance General ADL Comments: Pt educated on use of adaptive equipment secondary to back pain.       Vision   Vision Assessment?: No apparent visual deficits   Perception     Praxis      Cognition Arousal/Alertness: Awake/alert Behavior During Therapy: WFL for tasks assessed/performed Overall Cognitive Status: Impaired/Different from baseline Area of Impairment: Attention;Memory;Safety/judgement                 Orientation Level: Time;Place Current Attention Level: Selective Memory: Decreased short-term memory Following Commands: Follows one step commands consistently Safety/Judgement: Decreased awareness of deficits   Problem Solving: Slow processing;Decreased initiation;Difficulty sequencing;Requires verbal cues;Requires tactile cues          Exercises     Shoulder Instructions       General Comments      Pertinent Vitals/ Pain       Pain Assessment: 0-10 Pain Score: 7  Pain Location: back pain Pain Descriptors / Indicators: Aching;Discomfort Pain Intervention(s): Monitored during session;Premedicated before session  Home Living                                          Prior Functioning/Environment              Frequency  Min 2X/week        Progress Toward Goals  OT Goals(current goals can now be found in  the care plan section)  Progress towards OT goals: Progressing toward goals  Acute Rehab OT Goals Patient Stated Goal: to go home OT Goal Formulation: With patient Time For Goal Achievement: 10/25/20 Potential to Achieve Goals: Good ADL Goals Pt Will Perform Grooming: with supervision;standing Pt Will Perform Lower Body Bathing: with supervision;sit to/from stand Pt Will Perform Lower Body Dressing: with supervision Pt Will Perform Toileting - Clothing Manipulation and hygiene: with supervision;sit to/from stand  Plan      Co-evaluation                 AM-PAC OT "6 Clicks" Daily Activity     Outcome Measure   Help from another person eating meals?: None Help from another person taking care of  personal grooming?: A Little Help from another person toileting, which includes using toliet, bedpan, or urinal?: A Little Help from another person bathing (including washing, rinsing, drying)?: A Little Help from another person to put on and taking off regular upper body clothing?: A Little Help from another person to put on and taking off regular lower body clothing?: A Little 6 Click Score: 19    End of Session    OT Visit Diagnosis: Unsteadiness on feet (R26.81);Other abnormalities of gait and mobility (R26.89);Muscle weakness (generalized) (M62.81);Other symptoms and signs involving cognitive function   Activity Tolerance Patient tolerated treatment well   Patient Left in chair;with call bell/phone within reach;with chair alarm set;with family/visitor present   Nurse Communication  (ok therapy)        Time: 6333-5456 OT Time Calculation (min): 29 min  Charges: OT General Charges $OT Visit: 1 Visit OT Treatments $Self Care/Home Management : 23-37 mins  Reece Packer OT/L   Araceli Coufal 10/13/2020, 12:45 PM

## 2020-10-13 NOTE — Plan of Care (Signed)

## 2020-10-13 NOTE — Progress Notes (Signed)
Physical Therapy Treatment Patient Details Name: Holly Valdez MRN: [id number] DOB: November 16, 1935 Today's Date: 10/13/2020    History of Present Illness Pt is a 84 y.o. female admitted 10/10/20 with polyuria/polydipsia, general weakness, nausea/vomiting; workup for UTI. PMH includes HTN, HLD, DM with peripheral neuropathy, CKD IV, back sx.   PT Comments    Pt progressing with mobility. Today's session focused on transfer and gait training, pt requiring use of RW and intermittent assist to prevent fall due to LOB. Pt pleasantly confused, following majority of simple commands and motivated to participate despite c/o back pain. Remains limited by pain, generalized weakness and impaired balance strategies/postural reactions; at high risk for falls. Continue to recommend SNF-level therapies to maximize functional mobility and independence.    Follow Up Recommendations  SNF;Supervision/Assistance - 24 hour     Equipment Recommendations  Rolling walker with 5" wheels;3in1 (PT);Wheelchair (measurements PT);Wheelchair cushion (measurements PT)    Recommendations for Other Services       Precautions / Restrictions Precautions Precautions: Fall;Other (comment) Precaution Comments: chronic back pain; urine incontinence/urgency Restrictions Weight Bearing Restrictions: No    Mobility  Bed Mobility Overal bed mobility: Needs Assistance Bed Mobility: Rolling;Supine to Sit;Sit to Supine Rolling: Supervision   Supine to sit: Supervision Sit to supine: Supervision      Transfers Overall transfer level: Needs assistance Equipment used: Rolling walker (2 wheeled) Transfers: Sit to/from Stand Sit to Stand: Min guard         General transfer comment: Multiple sit<>stands from bed and couch to RW with close min guard for balance; cues for sequencing; significantly forward flexed posture with difficulty correcting  Ambulation/Gait Ambulation/Gait assistance: Min guard;Min assist Gait  Distance (Feet): 40 Feet Assistive device: Rolling walker (2 wheeled) Gait Pattern/deviations: Step-through pattern;Decreased stride length;Trunk flexed Gait velocity: Decreased   General Gait Details: Slow, unsteady gait with RW and close min guard for balance; difficulty achieving fully upright posture and maintaining closer proximity to RW despite cues; 1x LOB requiring assist to correct; 1x seated rest on couch secondary to back pain   Stairs             Wheelchair Mobility    Modified Rankin (Stroke Patients Only)       Balance Overall balance assessment: Needs assistance;History of Falls Sitting-balance support: No upper extremity supported;Feet supported Sitting balance-Leahy Scale: Fair       Standing balance-Leahy Scale: Fair Standing balance comment: Can static stand without UE support but unable to accept challenge; dynamic stability improved with UE support                            Cognition Arousal/Alertness: Awake/alert Behavior During Therapy: WFL for tasks assessed/performed Overall Cognitive Status: History of cognitive impairments - at baseline Area of Impairment: Orientation;Attention;Memory;Following commands;Safety/judgement;Awareness;Problem solving                 Orientation Level: Disoriented to;Place;Time Current Attention Level: Selective Memory: Decreased short-term memory Following Commands: Follows one step commands consistently;Follows multi-step commands inconsistently Safety/Judgement: Decreased awareness of deficits Awareness: Intellectual Problem Solving: Slow processing;Decreased initiation;Difficulty sequencing;Requires verbal cues;Requires tactile cues        Exercises      General Comments        Pertinent Vitals/Pain Pain Assessment: Faces Faces Pain Scale: Hurts little more Pain Location: Lower back Pain Descriptors / Indicators: Aching;Discomfort;Constant Pain Intervention(s): Monitored during  session;Limited activity within patient's tolerance    Home Living  Prior Function            PT Goals (current goals can now be found in the care plan section) Progress towards PT goals: Progressing toward goals    Frequency    Min 2X/week      PT Plan Current plan remains appropriate    Co-evaluation              AM-PAC PT "6 Clicks" Mobility   Outcome Measure  Help needed turning from your back to your side while in a flat bed without using bedrails?: A Little Help needed moving from lying on your back to sitting on the side of a flat bed without using bedrails?: A Little Help needed moving to and from a bed to a chair (including a wheelchair)?: A Little Help needed standing up from a chair using your arms (e.g., wheelchair or bedside chair)?: A Little Help needed to walk in hospital room?: A Little Help needed climbing 3-5 steps with a railing? : A Lot 6 Click Score: 17    End of Session Equipment Utilized During Treatment: Gait belt Activity Tolerance: Patient tolerated treatment well Patient left: in bed;with call bell/phone within reach;with bed alarm set Nurse Communication: Mobility status PT Visit Diagnosis: Unsteadiness on feet (R26.81);Difficulty in walking, not elsewhere classified (R26.2);Muscle weakness (generalized) (M62.81);History of falling (Z91.81)     Time: 9458-5929 PT Time Calculation (min) (ACUTE ONLY): 25 min  Charges:  $Therapeutic Activity: 23-37 mins                    Mabeline Caras, PT, DPT Acute Rehabilitation Services  Pager [phone] Office [place] 10/13/2020, 5:37 PM

## 2020-10-14 ENCOUNTER — Encounter (HOSPITAL_COMMUNITY): Payer: Self-pay | Admitting: Internal Medicine

## 2020-10-14 DIAGNOSIS — N179 Acute kidney failure, unspecified: Secondary | ICD-10-CM

## 2020-10-14 DIAGNOSIS — Z794 Long term (current) use of insulin: Secondary | ICD-10-CM

## 2020-10-14 DIAGNOSIS — E119 Type 2 diabetes mellitus without complications: Secondary | ICD-10-CM

## 2020-10-14 DIAGNOSIS — N1831 Chronic kidney disease, stage 3a: Secondary | ICD-10-CM

## 2020-10-14 LAB — BASIC METABOLIC PANEL
Anion gap: 13 (ref 5–15)
Anion gap: 12 (ref 5–15)
BUN: 22 mg/dL (ref 8–23)
BUN: 18 mg/dL (ref 8–23)
CO2: 18 mmol/L — ABNORMAL LOW (ref 22–32)
CO2: 21 mmol/L — ABNORMAL LOW (ref 22–32)
Calcium: 9.2 mg/dL (ref 8.9–10.3)
Calcium: 9.5 mg/dL (ref 8.9–10.3)
Chloride: 104 mmol/L (ref 98–111)
Chloride: 101 mmol/L (ref 98–111)
Creatinine, Ser: 1.34 mg/dL — ABNORMAL HIGH (ref 0.44–1.00)
Creatinine, Ser: 1.34 mg/dL — ABNORMAL HIGH (ref 0.44–1.00)
GFR, Estimated: 40 mL/min — ABNORMAL LOW (ref >60)
GFR, Estimated: 40 mL/min — ABNORMAL LOW (ref >60)
Glucose, Bld: 144 mg/dL — ABNORMAL HIGH (ref 70–99)
Glucose, Bld: 170 mg/dL — ABNORMAL HIGH (ref 70–99)
Potassium: 5.8 mmol/L — ABNORMAL HIGH (ref 3.5–5.1)
Potassium: 4.1 mmol/L (ref 3.5–5.1)
Sodium: 135 mmol/L (ref 135–145)
Sodium: 134 mmol/L — ABNORMAL LOW (ref 135–145)

## 2020-10-14 LAB — GLUCOSE, CAPILLARY
Glucose-Capillary: 159 mg/dL — ABNORMAL HIGH (ref 70–99)
Glucose-Capillary: 319 mg/dL — ABNORMAL HIGH (ref 70–99)
Glucose-Capillary: 312 mg/dL — ABNORMAL HIGH (ref 70–99)
Glucose-Capillary: 198 mg/dL — ABNORMAL HIGH (ref 70–99)

## 2020-10-14 LAB — SARS CORONAVIRUS 2 BY RT PCR (HOSPITAL ORDER, PERFORMED IN ~~LOC~~ HOSPITAL LAB): SARS Coronavirus 2: NEGATIVE

## 2020-10-14 MED ORDER — SODIUM ZIRCONIUM CYCLOSILICATE 10 G PO PACK: 10.0000 g | PACK | Freq: Three times a day (TID) | ORAL | Status: DC

## 2020-10-14 NOTE — Progress Notes (Addendum)
HD#3 Subjective:  Overnight Events: No acute events  Patient states she feels pretty good today. Denies any fever, abd pain, nausea and vomiting. Had a good night sleep. States someone is coming by to talk to her about where she will be going for rehab.  Objective:  Vital signs in last 24 hours: Vitals:   10/13/20 0443 10/13/20 0850 10/13/20 1434 10/13/20 2019  BP: (!) 142/75 135/72 (!) 157/77 (!) 144/72  Pulse: 95 86 (!) 103 98  Resp: 15 17 17 16   Temp: 98.2 F (36.8 C) 99.1 F (37.3 C) 98.2 F (36.8 C) 98.3 F (36.8 C)  TempSrc: Oral  Oral Oral  SpO2: 100% 96% 98% 98%  Weight:      Height:       Supplemental O2: Room Air SpO2: 98 %   Physical Exam:  Physical Exam Constitutional:      Appearance: Normal appearance.     Comments: Resting comfortably in bed  Cardiovascular:     Rate and Rhythm: Normal rate and regular rhythm.  Pulmonary:     Effort: Pulmonary effort is normal.     Breath sounds: Normal breath sounds.  Abdominal:     General: Abdomen is flat. Bowel sounds are normal. There is no distension.     Palpations: Abdomen is soft.     Tenderness: There is no abdominal tenderness.  Musculoskeletal:     Right lower leg: No edema.     Left lower leg: No edema.  Skin:    General: Skin is warm and dry.     Capillary Refill: Capillary refill takes less than 2 seconds.  Neurological:     General: No focal deficit present.     Mental Status: She is alert. Mental status is at baseline.  Psychiatric:        Mood and Affect: Mood normal.        Behavior: Behavior normal.     Filed Weights   10/10/20 0225 10/10/20 1504  Weight: 74.4 kg 75 kg    No intake or output data in the 24 hours ending 10/14/20 0644 Net IO Since Admission: 195 mL [10/14/20 0644]  Pertinent Labs: CBC Latest Ref Rng & Units 10/13/2020 10/12/2020 10/11/2020  WBC 4.0 - 10.5 K/uL 7.4 7.2 8.7  Hemoglobin 12.0 - 15.0 g/dL 13.8 12.7 12.9  Hematocrit 36 - 46 % 42.0 39.4 39.7    Platelets 150 - 400 K/uL 265 244 247    CMP Latest Ref Rng & Units 10/14/2020 10/13/2020 10/12/2020  Glucose 70 - 99 mg/dL 144(H) 116(H) 161(H)  BUN 8 - 23 mg/dL 22 16 21   Creatinine 0.44 - 1.00 mg/dL 1.34(H) 1.33(H) 1.36(H)  Sodium 135 - 145 mmol/L 135 136 135  Potassium 3.5 - 5.1 mmol/L 5.8(H) 3.9 3.9  Chloride 98 - 111 mmol/L 104 101 102  CO2 22 - 32 mmol/L 18(L) 22 24  Calcium 8.9 - 10.3 mg/dL 9.2 9.5 9.2  Total Protein 6.5 - 8.1 g/dL - - -  Total Bilirubin 0.3 - 1.2 mg/dL - - -  Alkaline Phos 38 - 126 U/L - - -  AST 15 - 41 U/L - - -  ALT 0 - 44 U/L - - -    Imaging: No results found.  Assessment/Plan:   Principal Problem:   Acute kidney injury ([place]) Active Problems:   UTI (urinary tract infection)   Hypovolemia   Uncontrolled type 2 diabetes mellitus with hyperglycemia (HCC)   CKD (chronic kidney disease) stage 3,  GFR 30-59 ml/min (HCC)   Dementia ([place])   Patient Summary: Holly Valdez is a 48 yof presenting with polyuria, polydipsia, nausea and vomiting who is being admitted to IMTS for hypovolemia and hyperglycemia.  Pending SNF placement.   Principal Problem:   Acute kidney injury ([place]) Active Problems:   UTI (urinary tract infection)   Hypovolemia   Uncontrolled type 2 diabetes mellitus with hyperglycemia (HCC)   CKD (chronic kidney disease) stage 3, GFR 30-59 ml/min (HCC)   Dementia (HCC)   Uncomplicated urinary tract infection.  Dysuria and abdominal pain resolved today. No tenderness on exam.  Polyuria/polydipsia -resolved likely due to hypoglycemia event secondary to not taking her medications. Plan -Last day of keflex today (day 5/5) - PT/OT recommending SNF, has placement at [facility], ordered COVID test   Type II Diabetes Mellitus with hyperglycemia complicated by peripheral neuropathy.  Hgb A1c 10.8 (was 6.3 in August).  Fasting CBG 170 this morning Plan - Continue Lantus 10 units nightly - moderate sliding scale - Unable to restart GLP-1  while inpatient will restart on discharge - Recommend discontinuing glipizide - duloxetine for diabetic neuropathy - recommend close outpatient follow up with PCP. A1C goal <8.   Nausea/vomitting resolved Can consider outpatient study for gastroparesis if continue to have nausea vomiting   Hypertension  Bradycardia-resolved Plan:  -Continue holding Coreg -continue Losartan and hydralazine   Stage 3b CKD.  Baseline Cr 1.2. Creatine stable at 1.3. Potassium of 5.8 on repeat 4.1. This may be her new baseline creatinine - Encourage Po intake - Holding spironolactone  in the setting of AKI  Diet: HH/CM IVF: NA VTE: Lovenox CODE: DNR   Prior to Admission Living Arrangement: home Anticipated Discharge Location: SNF  Barriers to Discharge:  SNF placement   Iona Beard, MD 10/14/2020, 6:44 AM Pager: [phone]  Please contact the on call pager after 5 pm and on weekends at [phone].

## 2020-10-14 NOTE — TOC Initial Note (Signed)
Transition of Care [facility]) - Initial/Assessment Note    Patient Details  Name: Holly Valdez MRN: [id number] Date of Birth: 09/13/1936  [facility]) CM/SW Contact:    Curlene Labrum, RN Phone Number: 10/14/2020, 10:17 AM  Clinical Narrative:                 Case management met with the patient last evening after speaking with patient briefly to start SNF work up for rehabilitation.  I met with the patient and daughter at the bedside to given Medicare choice regarding SNf placement - waiting on bed availability to be able to offer choice.  The patient is medically ready for discharge to SNF by tomorrow per MD note.  The patient currently lives at [place] after moving there from [place], [place].   She has recently moved here and has been at [facility] about a week according to the daughter.  The patient is fully vaccinated for COVID through [place] and recently received the COVID booster vaccine.  The patient is new to the area and does not have a PCP per the daughter.  I set the patient up with [facility] until the daughter is able to call [facility] for new PCP.  Will continue to follow for [facility] placement.  Expected Discharge Plan: Skilled Nursing Facility Barriers to Discharge: No Barriers Identified   Patient Goals and CMS Choice Patient states their goals for this hospitalization and ongoing recovery are:: Patient plans to admit to [facility] rehab. CMS Medicare.gov Compare Post Acute Care list provided to:: Patient Represenative (must comment) (daughter) Choice offered to / list presented to : Patient, [facility] POA / Guardian  Expected Discharge Plan and Services Expected Discharge Plan: [place]   Discharge Planning Services: CM Consult   Living arrangements for the past 2 months: [place] ([facility])                                      Prior Living  Arrangements/Services Living arrangements for the past 2 months: [facility]) Lives with:: Facility Resident Patient language and need for interpreter reviewed:: Yes Do you feel safe going back to the place where you live?: Yes      Need for Family Participation in Patient Care: Yes (Comment) Care giver support system in place?: Yes (comment) Current home services: Housekeeping Criminal Activity/Legal Involvement Pertinent to Current Situation/Hospitalization: No - Comment as needed  Activities of Daily Living      Permission Sought/Granted Permission sought to share information with : Case Manager Permission granted to share information with : Yes, Verbal Permission Granted     Permission granted to share info w AGENCY: SNF facility  Permission granted to share info w Relationship: daughter, Shelbie Proctor     Emotional Assessment Appearance:: Appears stated age Attitude/Demeanor/Rapport: Engaged Affect (typically observed): Accepting Orientation: : Oriented to Self, Oriented to Place, Oriented to  Time, Oriented to Situation Alcohol / Substance Use: Not Applicable Psych Involvement: No (comment)  Admission diagnosis:  UTI (urinary tract infection) [N39.0] Hyperglycemia [R73.9] Pyuria [R82.81] Intractable vomiting with nausea, unspecified vomiting type [R11.2] Hypovolemia [E86.1] Patient Active Problem List   Diagnosis Date Noted   Hypovolemia 10/11/2020   Uncontrolled type 2 diabetes mellitus with hyperglycemia ([place]) 10/11/2020   Acute kidney injury ([place]) 10/11/2020   CKD (chronic kidney disease) stage 3, GFR 30-59  ml/min ([place]) 10/11/2020   Dementia ([place]) 10/11/2020   UTI (urinary tract infection) 10/10/2020   PCP:  Patient, No Pcp Per Pharmacy:  No Pharmacies Listed    Social Determinants of Health (SDOH) Interventions    Readmission Risk Interventions Readmission Risk Prevention Plan 10/14/2020  Post Dischage Appt Complete   Medication Screening Complete  Transportation Screening Complete

## 2020-10-14 NOTE — TOC Progression Note (Signed)
Transition of Care [facility]) - Progression Note    Patient Details  Name: Holly Valdez MRN: [id number] Date of Birth: July 11, 1936  Transition of Care [facility]) CM/SW Contact  Curlene Labrum, RN Phone Number: 10/14/2020, 1:21 PM  Clinical Narrative:    Case management spoke with the patient's daughter and offered Medicare choice for [facility] placement and the daughter chose [facility].  The patient will have an available bed in the morning and will have a COVID screen done today and will transfer to the facility tomorrow via ambulance - [facility].   Expected Discharge Plan: [place] Barriers to Discharge: No Barriers Identified  Expected Discharge Plan and Services Expected Discharge Plan: [place]   Discharge Planning Services: CM Consult   Living arrangements for the past 2 months: [place] ([facility])                                       Social Determinants of Health (SDOH) Interventions    Readmission Risk Interventions Readmission Risk Prevention Plan 10/14/2020  Post Dischage Appt Complete  Medication Screening Complete  Transportation Screening Complete

## 2020-10-14 NOTE — Plan of Care (Signed)
Pt has been doing very well today. CBG still high before each meal throughout the day. She is eating well and does not complain of pain. Vital signs have been stable. Will continue to monitor.   Problem: Education: Goal: Knowledge of General Education information will improve Description: Including pain rating scale, medication(s)/side effects and non-pharmacologic comfort measures Outcome: Progressing   Problem: Activity: Goal: Risk for activity intolerance will decrease Outcome: Progressing   Problem: Nutrition: Goal: Adequate nutrition will be maintained Outcome: Progressing   Problem: Elimination: Goal: Will not experience complications related to bowel motility Outcome: Progressing Goal: Will not experience complications related to urinary retention Outcome: Progressing   Problem: Pain Managment: Goal: General experience of comfort will improve Outcome: Progressing   Problem: Safety: Goal: Ability to remain free from injury will improve Outcome: Progressing

## 2020-10-14 NOTE — Progress Notes (Signed)
Inpatient Diabetes Program Recommendations  AACE/ADA: New Consensus Statement on Inpatient Glycemic Control (2015)  Target Ranges:  Prepandial:   less than 140 mg/dL      Peak postprandial:   less than 180 mg/dL (1-2 hours)      Critically ill patients:  140 - 180 mg/dL   Lab Results  Component Value Date   GLUCAP 319 (H) 10/14/2020   HGBA1C 10.8 (H) 10/10/2020    Review of Glycemic Control Results for Holly Valdez, Holly Valdez (MRN [id number]) as of 10/14/2020 12:04  Ref. Range 10/14/2020 06:49 10/14/2020 11:35  Glucose-Capillary Latest Ref Range: 70 - 99 mg/dL 159 (H) 319 (H)     Inpatient Diabetes Program Recommendations:     Might consider Novolog 3 units tid with meals if eats at least 50% of meal.  Will continue to follow while inpatient.  Thank you, Reche Dixon, RN, BSN Diabetes Coordinator Inpatient Diabetes Program [phone] (team pager from 8a-5p)

## 2020-10-15 ENCOUNTER — Telehealth: Payer: Self-pay

## 2020-10-15 LAB — BASIC METABOLIC PANEL
Anion gap: 13 (ref 5–15)
BUN: 20 mg/dL (ref 8–23)
CO2: 20 mmol/L — ABNORMAL LOW (ref 22–32)
Calcium: 9.5 mg/dL (ref 8.9–10.3)
Chloride: 104 mmol/L (ref 98–111)
Creatinine, Ser: 1.33 mg/dL — ABNORMAL HIGH (ref 0.44–1.00)
GFR, Estimated: 40 mL/min — ABNORMAL LOW (ref >60)
Glucose, Bld: 156 mg/dL — ABNORMAL HIGH (ref 70–99)
Potassium: 4.2 mmol/L (ref 3.5–5.1)
Sodium: 137 mmol/L (ref 135–145)

## 2020-10-15 LAB — GLUCOSE, CAPILLARY
Glucose-Capillary: 160 mg/dL — ABNORMAL HIGH (ref 70–99)
Glucose-Capillary: 179 mg/dL — ABNORMAL HIGH (ref 70–99)
Glucose-Capillary: 239 mg/dL — ABNORMAL HIGH (ref 70–99)
Glucose-Capillary: 330 mg/dL — ABNORMAL HIGH (ref 70–99)

## 2020-10-15 MED ORDER — INSULIN GLARGINE 100 UNIT/ML SOLOSTAR PEN: 15.0000 [IU] | PEN_INJECTOR | Freq: Every day | SUBCUTANEOUS | 0 refills | Status: DC

## 2020-10-15 MED ORDER — INSUPEN PEN NEEDLES 32G X 4 MM MISC: 1.0000 [IU] | Freq: Every day | 0 refills | Status: AC

## 2020-10-15 NOTE — Telephone Encounter (Signed)
New HFU per Dr Lisabeth Devoid to est care; pt appt 10/29/20 1:45pm/NW

## 2020-10-15 NOTE — Discharge Summary (Addendum)
Name: Holly Valdez MRN: [id number] DOB: 1936/05/07 84 y.o. PCP: Patient, No Pcp Per  Date of Admission: 10/10/2020  2:19 AM Date of Discharge:  10/15/20 1:34 PM Attending Physician: Lucious Groves, DO  Discharge Diagnosis: 1. Urinary tract infection 2. Uncontrolled type 2 diabetes mellitus 3. Acute on chronic kidney disease stage 3 4. hypovolemia  Discharge Medications: Allergies as of 10/15/2020       Reactions   Penicillins    Tolerated keflex and ceftriaxone while hospitalized 09/2020        Medication List     STOP taking these medications    carvedilol 25 MG tablet Commonly known as: COREG   glipiZIDE 2.5 MG 24 hr tablet Commonly known as: GLUCOTROL XL   oxyCODONE-acetaminophen 5-325 MG tablet Commonly known as: PERCOCET/ROXICET   spironolactone 50 MG tablet Commonly known as: ALDACTONE       TAKE these medications    aspirin 81 MG EC tablet Take 1 tablet by mouth daily.   atorvastatin 20 MG tablet Commonly known as: LIPITOR Take 40 mg by mouth every evening.   calcium carbonate 1500 (600 Ca) MG Tabs tablet Commonly known as: OSCAL Take 1 tablet by mouth 2 (two) times daily.   Cholecalciferol 50 MCG (2000 UT) Caps Take 1 capsule by mouth daily.   DSS 100 MG Caps Take 1 capsule by mouth daily.   Dulaglutide 1.5 MG/0.5ML Sopn Inject 1.5 mg into the skin once a week. Every Friday   DULoxetine 60 MG capsule Commonly known as: CYMBALTA Take 1 capsule by mouth daily.   hydrALAZINE 25 MG tablet Commonly known as: APRESOLINE Take 25 mg by mouth 3 (three) times daily.   insulin glargine 100 UNIT/ML Solostar Pen Commonly known as: LANTUS Inject 15 Units into the skin daily.   Insupen Pen Needles 32G X 4 MM Misc Generic drug: Insulin Pen Needle 1 Units by Does not apply route daily.   losartan 100 MG tablet Commonly known as: COZAAR Take 1 tablet by mouth daily.        Disposition and follow-up:   Ms.Kanna S Liszewski was  discharged from [facility] in Stable condition.  At the hospital follow up visit please address:  1.  Diabetes: ensure medication compliance and ability to use   Acute on chronic kidney disease: Assess kidney function, stopped on home oxycodone due to poor kidney function  Chronic back pain: Pain medication stopped due to kidney function. Not needed addition pain meds during admission. Will need to assess if she still needs pain medication. If still having significant pain could consider PO dilaudid.  Hypertension: Monitor BP assess need to restart spironolactone and Coreg  2.  Labs / imaging needed at time of follow-up: BMP   3.  Pending labs/ test needing follow-up: none  Follow-up Appointments:  Follow-up Information     [place]. Go on 10/29/2020.   Why: You have an appointment scheduled for 1:45 pm Contact information: [address] [place] [facility] Course by problem list: 1. Uncomplicated urinary tract infection.  Presented with polydipsia polyuria with dysuria and abdominal pain. this is likely due to hyperglycemia event secondary to not taking her medications. Treated with 5 days of antibiotics with improvement of symptoms.   Type II Diabetes Mellitus with hyperglycemia complicated by peripheral neuropathy. Glucose on admission was 641. Last A1C 6.1,  A1c is 10.8 during admission.. Reportedly non-compliant with trulicity and glipizide at home. Will start with a moderate sliding scale and lantus with improvement. Will discharge on insulin given high glucose levels during admission and elevated A1c.    Hypertension/ hyperlipidemia.  Hx of bradycardia Initially resumed on home lipitor, losartan, coreg, hydralazine, spironolactone. However she developed bradycardia with Hr in 40s held coreg and spironolactone for renal function with improvement of HR to 80s . She has remained  normotensive will discharge on lipitor, losartan, and hydralazine.   Stage 3b CKD. Stable at 1.3 at discharge.  Discharge Vitals:   BP 122/75 (BP Location: Left Arm)   Pulse 88   Temp 98.2 F (36.8 C) (Oral)   Resp 17   Ht 5\' 2"  (1.575 m)   Wt 75 kg   SpO2 100%   BMI 30.24 kg/m   Pertinent Labs, Studies, and Procedures:  BMP Latest Ref Rng & Units 10/15/2020 10/14/2020 10/14/2020  Glucose 70 - 99 mg/dL 156(H) 170(H) 144(H)  BUN 8 - 23 mg/dL 20 18 22   Creatinine 0.44 - 1.00 mg/dL 1.33(H) 1.34(H) 1.34(H)  Sodium 135 - 145 mmol/L 137 134(L) 135  Potassium 3.5 - 5.1 mmol/L 4.2 4.1 5.8(H)  Chloride 98 - 111 mmol/L 104 101 104  CO2 22 - 32 mmol/L 20(L) 21(L) 18(L)  Calcium 8.9 - 10.3 mg/dL 9.5 9.5 9.2   CBC Latest Ref Rng & Units 10/13/2020 10/12/2020 10/11/2020  WBC 4.0 - 10.5 K/uL 7.4 7.2 8.7  Hemoglobin 12.0 - 15.0 g/dL 13.8 12.7 12.9  Hematocrit 36 - 46 % 42.0 39.4 39.7  Platelets 150 - 400 K/uL 265 244 247    Discharge Instructions: Discharge Instructions     Diet Carb Modified   Complete by: As directed    Discharge instructions   Complete by: As directed    You were hospitalized for a urinary tract infection caused by increased blood sugar. Thank you for allowing [place] to be part of your care.    Please follow up with the following providers: [place] on 10/29/2020 at 1:45pm  Please note these changes made to your medications:  - Medications to start: Lantus 15 units every night  Continue to take your aspirin, atorvastatin, trulicity, duloxetine, hydralizine, and losartan  - Medications to discontinue:  Stop taking Glipizide, spironolactone, and carvedilol  Please make sure to   Please call our clinic if you have any questions or concerns, we may be able to help and keep you from a long and expensive emergency room wait. Our clinic and after hours phone number is [phone], the best time to call is Monday through Friday 9 am to 4 pm  but there is always someone available 24/7 if you have an emergency. If you need medication refills please notify your pharmacy one week in advance and they will send [place] a request.   Increase activity slowly   Complete by: As directed        Signed: Iona Beard, MD 10/15/2020, 1:34 PM   Pager: [phone]

## 2020-10-15 NOTE — TOC Transition Note (Addendum)
Transition of Care [facility]) - CM/SW Discharge Note   Patient Details  Name: Holly Valdez MRN: [id number] Date of Birth: [birth year]  Transition of Care [facility]) CM/SW Contact:  Curlene Labrum, RN Phone Number: 10/15/2020, 12:54 PM   Clinical Narrative:     Case management spoke with the patient's physician team this morning and they are preparing the patient's discharge summary so that she can transfer to [facility] today for rehab.  Will continue to follow the patient for discharge.  Clinicals were placed in the HUB for [facility].  I called the patient's daughter and notified her that the patient would be transferred after 3 pm today.  Langley Gauss, RN will call report to the facility at [phone].  PTAR was scheduled for 3 :30 pm today to transfer.  Will continue to follow for patient's discharge to the facility.    Final next level of care: [place] Barriers to Discharge: No Barriers Identified   Patient Goals and CMS Choice Patient states their goals for this hospitalization and ongoing recovery are:: Patient plans to admit to [facility] rehab. CMS Medicare.gov Compare Post Acute Care list provided to:: Patient Represenative (must comment) (daughter) Choice offered to / list presented to : Patient, [facility] POA / [place]  Discharge Placement                       Discharge Plan and Services   Discharge Planning Services: CM Consult                                 Social Determinants of Health (SDOH) Interventions     Readmission Risk Interventions Readmission Risk Prevention Plan 10/14/2020  Post Dischage Appt Complete  Medication Screening Complete  Transportation Screening Complete

## 2020-10-15 NOTE — Plan of Care (Signed)
  Problem: Education: Goal: Knowledge of General Education information will improve Description: Including pain rating scale, medication(s)/side effects and non-pharmacologic comfort measures Outcome: Progressing   Problem: Activity: Goal: Risk for activity intolerance will decrease Outcome: Progressing   

## 2020-10-15 NOTE — Progress Notes (Signed)
Pt left unit with PTAR to  [facility] facility.  Report called and made aware pt is en route to the facility.

## 2020-10-15 NOTE — Progress Notes (Signed)
HD#4 Subjective:  Overnight Events: No acute events   States she continues to have back pain but denies any N/V, abdominal pain States her diabetes medications include "blue pills" and Trulicity. States she is not on insulin at home. Informed patient that she will be going home with insulin. Patient was advised to schedule an appt to discuss with her PCP about how she can continue taking insulin at home.   Called daughter over the phone, Patient recently moved to [place] from [place] and need to establish with a new PCP in the area. Would be happy to have patient establish [facility].  Objective:  Vital signs in last 24 hours: Vitals:   10/14/20 0940 10/14/20 1148 10/14/20 2042 10/15/20 0437  BP: 134/79 (!) 128/50 107/65 121/72  Pulse: 81 94 83 87  Resp: 17 17 18 17   Temp: 97.8 F (36.6 C) 97.8 F (36.6 C) 98.4 F (36.9 C) 98.3 F (36.8 C)  TempSrc: Oral Oral Oral Oral  SpO2: 98% 98% 99% 99%  Weight:      Height:       Supplemental O2: Room Air SpO2: 99 %   Physical Exam:  Physical Exam Constitutional:      Appearance: Normal appearance.     Comments: Resting comfortably in bed  Cardiovascular:     Rate and Rhythm: Normal rate and regular rhythm.  Pulmonary:     Effort: Pulmonary effort is normal.     Breath sounds: Normal breath sounds.  Abdominal:     General: Abdomen is flat. Bowel sounds are normal. There is no distension.     Palpations: Abdomen is soft.     Tenderness: There is no abdominal tenderness.  Musculoskeletal:     Right lower leg: No edema.     Left lower leg: No edema.  Skin:    General: Skin is warm and dry.     Capillary Refill: Capillary refill takes less than 2 seconds.  Neurological:     General: No focal deficit present.     Mental Status: She is alert. Mental status is at baseline.  Psychiatric:        Mood and Affect: Mood normal.        Behavior: Behavior normal.     Filed Weights   10/10/20  0225 10/10/20 1504  Weight: 74.4 kg 75 kg     Intake/Output Summary (Last 24 hours) at 10/15/2020 0508 Last data filed at 10/15/2020 0300 Gross per 24 hour  Intake --  Output 400 ml  Net -400 ml   Net IO Since Admission: -205 mL [10/15/20 0508]  Pertinent Labs: CBC Latest Ref Rng & Units 10/13/2020 10/12/2020 10/11/2020  WBC 4.0 - 10.5 K/uL 7.4 7.2 8.7  Hemoglobin 12.0 - 15.0 g/dL 13.8 12.7 12.9  Hematocrit 36 - 46 % 42.0 39.4 39.7  Platelets 150 - 400 K/uL 265 244 247    CMP Latest Ref Rng & Units 10/15/2020 10/14/2020 10/14/2020  Glucose 70 - 99 mg/dL 156(H) 170(H) 144(H)  BUN 8 - 23 mg/dL 20 18 22   Creatinine 0.44 - 1.00 mg/dL 1.33(H) 1.34(H) 1.34(H)  Sodium 135 - 145 mmol/L 137 134(L) 135  Potassium 3.5 - 5.1 mmol/L 4.2 4.1 5.8(H)  Chloride 98 - 111 mmol/L 104 101 104  CO2 22 - 32 mmol/L 20(L) 21(L) 18(L)  Calcium 8.9 - 10.3 mg/dL 9.5 9.5 9.2  Total Protein 6.5 - 8.1 g/dL - - -  Total Bilirubin 0.3 - 1.2 mg/dL - - -  Alkaline Phos 38 - 126 U/L - - -  AST 15 - 41 U/L - - -  ALT 0 - 44 U/L - - -    Imaging: No results found.  Assessment/Plan:   Principal Problem:   Acute kidney injury ([place]) Active Problems:   UTI (urinary tract infection)   Hypovolemia   Uncontrolled type 2 diabetes mellitus with hyperglycemia (HCC)   CKD (chronic kidney disease) stage 3, GFR 30-59 ml/min ([place])   Dementia ([place])   Patient Summary: Holly Valdez is a 23 yof presenting with polyuria, polydipsia, nausea and vomiting who is being admitted to IMT[facility] for hypovolemia and hyperglycemia.  Pending SNF placement.   Principal Problem:   Acute kidney injury ([place]) Active Problems:   UTI (urinary tract infection)   Hypovolemia   Uncontrolled type 2 diabetes mellitus with hyperglycemia (HCC)   CKD (chronic kidney disease) stage 3, GFR 30-59 ml/min (HCC)   Dementia (HCC)   Uncomplicated urinary tract infection.  Polyuria/polydipsia -resolved Finished 5 day course of antibiotics, no  longer endorsing symptoms - PT/OT recommending SNF, has placement at [facility], COVID negative   Type II Diabetes Mellitus with hyperglycemia complicated by peripheral neuropathy.  Hgb A1c 10.8 (was 6.3 in August). Fasting CBG 170 this morning Plan - Discharge on Lantus 15 units and home trulicity she may need be tritiated up on trulicity - Discontinue glipizide -  Will need to establish with new PCP as she recently moved. A1C goal <8.   Hypertension  Bradycardia-resolved Plan:  -Continue holding Coreg and spiro on discharge -continue Losartan and hydralazine   Stage 3b CKD.  Creatine stable at 1.3.  - Holding spironolactone  in the setting of AKI and normotension  Diet: HH/CM IVF: NA VTE: Lovenox CODE: DNR   Prior to Admission Living Arrangement: home Anticipated Discharge Location: SNF  Barriers to Discharge: None, discharge to SNF today   Iona Beard, MD 10/15/2020, 5:08 AM Pager: [phone]  Please contact the on call pager after 5 pm and on weekends at [phone].

## 2020-10-21 NOTE — Telephone Encounter (Signed)
Transition Care Management Follow-up Telephone Call  Date of discharge and from where: 10/15/2020 from [facility]  How have you been since you were released from the hospital? "I feel well but my legs still hurt." Has not taken oxycodone in a while and was instructed to discontinue at d/c 2/2 AKI  Any questions or concerns? No  Items Reviewed:  Did the pt receive and understand the discharge instructions provided? No , being handled by SNF  Medications obtained and verified? Unable to verify as meds are being administered by staff at [facility]. No nurse was available to do med rec during phone call.  Other? No   Any new allergies since your discharge? No   Dietary orders reviewed? Yes, being prepared by SNF  Do you have support at home? Yes , Patient's Goddaughter was also on phone call.  Home Care and Equipment/Supplies: Were home health services ordered? Yes, [facility] If so, what is the name of the agency? Done at [facility]  Has the agency set up a time to come to the patient's home? Yes Were any new equipment or medical supplies ordered?  No What is the name of the medical supply agency? NA Were you able to get the supplies/equipment? not applicable Do you have any questions related to the use of the equipment or supplies? NA  Functional Questionnaire: (I = Independent and D = Dependent) ADLs: Needs assist  Bathing/Dressing- Needs assist  Meal Prep- Done by [facility]  Eating- I  Maintaining continence- Needs assist to BR  Transferring/Ambulation- Needs assist  Managing Meds- Administered by staff at [facility]  Follow up appointments reviewed:   PCP Hospital f/u appt confirmed? Spoke with Benjamine Mola, CSW at [facility]. states they will schedule a HFU 7-10 days after d/c fro SNF . She will relay our info to their discharge planner to sched [facility] f/u appt confirmed? NA   Are transportation arrangements needed? No   If their condition worsens, is  the pt aware to call PCP or go to the Emergency Dept.? Patient residing at [facility] and is under care of their Medical Director at this time  Was the patient provided with contact information for the PCP's office or ED? No  Was to pt encouraged to call back with questions or concerns? Yes  L. Aidyn Kellis, BSN, RN-BC

## 2020-10-29 ENCOUNTER — Encounter: Payer: Medicare Other | Admitting: Internal Medicine

## 2020-11-04 NOTE — Progress Notes (Deleted)
Patient ID: Holly Valdez, female   DOB: 1936-05-31, 84 y.o.   MRN: [id number]   After hospitalization  11/27-12/12/2019  From discharge summary: Disposition and follow-up:   Holly Valdez was discharged from [facility] in Stable condition.  At the hospital follow up visit please address:  1.  Diabetes: ensure medication compliance and ability to use   Acute on chronic kidney disease: Assess kidney function, stopped on home oxycodone due to poor kidney function  Chronic back pain: Pain medication stopped due to kidney function. Not needed addition pain meds during admission. Will need to assess if she still needs pain medication. If still having significant pain could consider PO dilaudid.  Hypertension: Monitor BP assess need to restart spironolactone and Coreg  2.  Labs / imaging needed at time of follow-up: BMP   3.  Pending labs/ test needing follow-up: none

## 2020-11-10 ENCOUNTER — Inpatient Hospital Stay: Payer: Medicare Other | Admitting: Physician Assistant

## 2020-12-07 ENCOUNTER — Emergency Department (HOSPITAL_COMMUNITY)
Admission: EM | Admit: 2020-12-07 | Discharge: 2020-12-10 | Disposition: A | Discharge status: Home / Self Care | Payer: Medicare Other | Attending: Emergency Medicine | Admitting: Emergency Medicine

## 2020-12-07 ENCOUNTER — Encounter (HOSPITAL_COMMUNITY): Payer: Self-pay | Admitting: *Deleted

## 2020-12-07 DIAGNOSIS — N183 Chronic kidney disease, stage 3 unspecified: Secondary | ICD-10-CM | POA: Insufficient documentation

## 2020-12-07 DIAGNOSIS — E1122 Type 2 diabetes mellitus with diabetic chronic kidney disease: Secondary | ICD-10-CM | POA: Insufficient documentation

## 2020-12-07 DIAGNOSIS — Z794 Long term (current) use of insulin: Secondary | ICD-10-CM | POA: Diagnosis not present

## 2020-12-07 DIAGNOSIS — Z79899 Other long term (current) drug therapy: Secondary | ICD-10-CM | POA: Insufficient documentation

## 2020-12-07 DIAGNOSIS — Z87891 Personal history of nicotine dependence: Secondary | ICD-10-CM | POA: Diagnosis not present

## 2020-12-07 DIAGNOSIS — F0390 Unspecified dementia without behavioral disturbance: Secondary | ICD-10-CM | POA: Insufficient documentation

## 2020-12-07 DIAGNOSIS — Z20822 Contact with and (suspected) exposure to covid-19: Secondary | ICD-10-CM | POA: Insufficient documentation

## 2020-12-07 DIAGNOSIS — R531 Weakness: Secondary | ICD-10-CM | POA: Diagnosis not present

## 2020-12-07 DIAGNOSIS — I129 Hypertensive chronic kidney disease with stage 1 through stage 4 chronic kidney disease, or unspecified chronic kidney disease: Secondary | ICD-10-CM | POA: Diagnosis not present

## 2020-12-07 HISTORY — DX: Type 2 diabetes mellitus without complications: E11.9

## 2020-12-07 HISTORY — DX: Unspecified dementia, unspecified severity, without behavioral disturbance, psychotic disturbance, mood disturbance, and anxiety: F03.90

## 2020-12-07 HISTORY — DX: Essential (primary) hypertension: I10

## 2020-12-07 LAB — CBC
HCT: 42.8 % (ref 36.0–46.0)
Hemoglobin: 13.4 g/dL (ref 12.0–15.0)
MCH: 27.5 pg (ref 26.0–34.0)
MCHC: 31.3 g/dL (ref 30.0–36.0)
MCV: 87.9 fL (ref 80.0–100.0)
Platelets: 266 10*3/uL (ref 150–400)
RBC: 4.87 MIL/uL (ref 3.87–5.11)
RDW: 14.3 % (ref 11.5–15.5)
WBC: 7.2 10*3/uL (ref 4.0–10.5)
nRBC: 0 % (ref 0.0–0.2)

## 2020-12-07 LAB — COMPREHENSIVE METABOLIC PANEL
ALT: 18 U/L (ref 0–44)
AST: 21 U/L (ref 15–41)
Albumin: 3.7 g/dL (ref 3.5–5.0)
Alkaline Phosphatase: 58 U/L (ref 38–126)
Anion gap: 10 (ref 5–15)
BUN: 22 mg/dL (ref 8–23)
CO2: 24 mmol/L (ref 22–32)
Calcium: 10 mg/dL (ref 8.9–10.3)
Chloride: 103 mmol/L (ref 98–111)
Creatinine, Ser: 1.4 mg/dL — ABNORMAL HIGH (ref 0.44–1.00)
GFR, Estimated: 38 mL/min — ABNORMAL LOW (ref >60)
Glucose, Bld: 271 mg/dL — ABNORMAL HIGH (ref 70–99)
Potassium: 4 mmol/L (ref 3.5–5.1)
Sodium: 137 mmol/L (ref 135–145)
Total Bilirubin: 0.3 mg/dL (ref 0.3–1.2)
Total Protein: 7 g/dL (ref 6.5–8.1)

## 2020-12-07 NOTE — ED Triage Notes (Signed)
Per family member, pt has dementia and has been living at home alone. Was dc from hospital in December following UTIs. Pt having increase in weakness, unable to ambulate at home and increase in confusion. POA is Art therapist and she is requesting NH placement.

## 2020-12-08 ENCOUNTER — Emergency Department (HOSPITAL_COMMUNITY): Payer: Medicare Other

## 2020-12-08 DIAGNOSIS — F0390 Unspecified dementia without behavioral disturbance: Secondary | ICD-10-CM | POA: Diagnosis not present

## 2020-12-08 LAB — CBG MONITORING, ED
Glucose-Capillary: 225 mg/dL — ABNORMAL HIGH (ref 70–99)
Glucose-Capillary: 141 mg/dL — ABNORMAL HIGH (ref 70–99)

## 2020-12-08 LAB — SARS CORONAVIRUS 2 BY RT PCR (HOSPITAL ORDER, PERFORMED IN ~~LOC~~ HOSPITAL LAB): SARS Coronavirus 2: NEGATIVE

## 2020-12-08 IMAGING — CT CT HEAD W/O CM
4 series · 15 of 47 positions shown, 17 images · non-contrast
Comparison: None.

CLINICAL DATA: Altered mental status

EXAM:
CT HEAD WITHOUT CONTRAST
TECHNIQUE: Contiguous axial images were obtained from the base of the skull
through the vertex without intravenous contrast.

[Series 3: head without · axial · non-contrast · 0.39mm/px · z∈[+73,+188]mm · 7 of 31 slices shown, 9 images]
[im 4/31  brain]
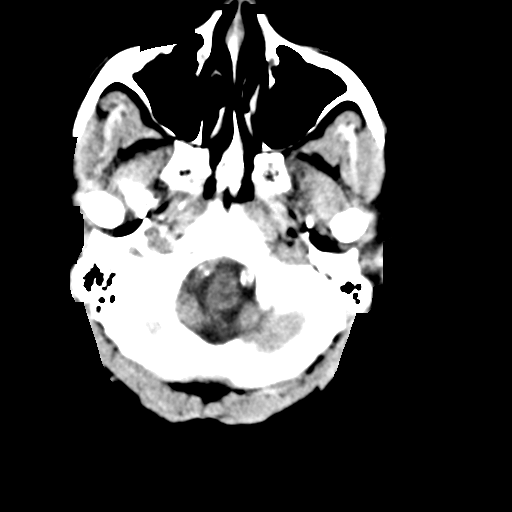
[im 4/31  bone]
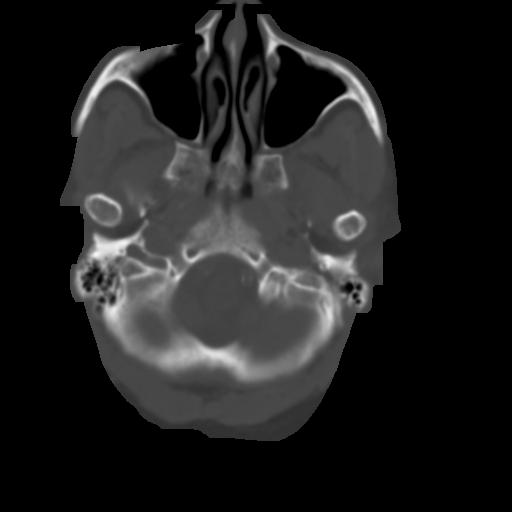
[im 8/31  brain]
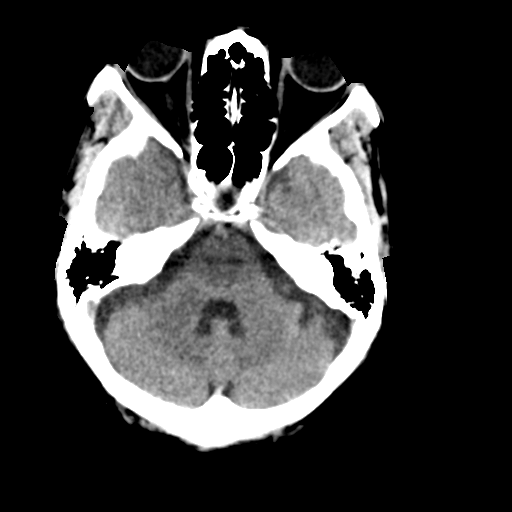
[im 12/31  brain]
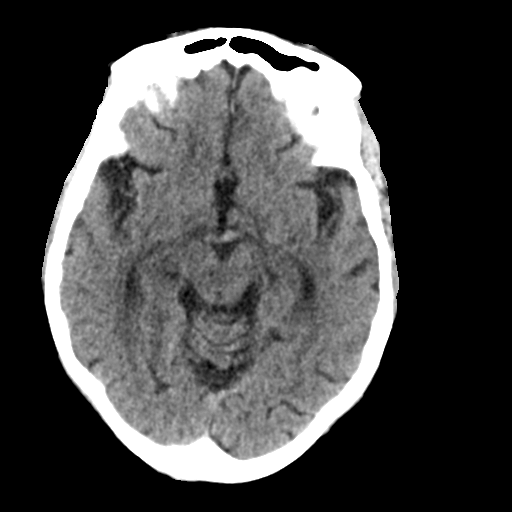
[im 16/31  brain]
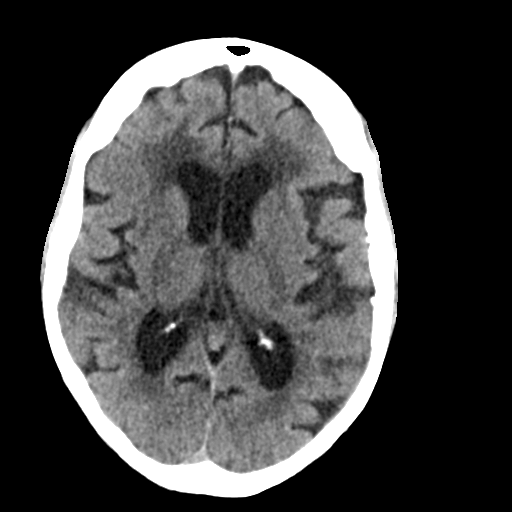
[im 19/31  brain]
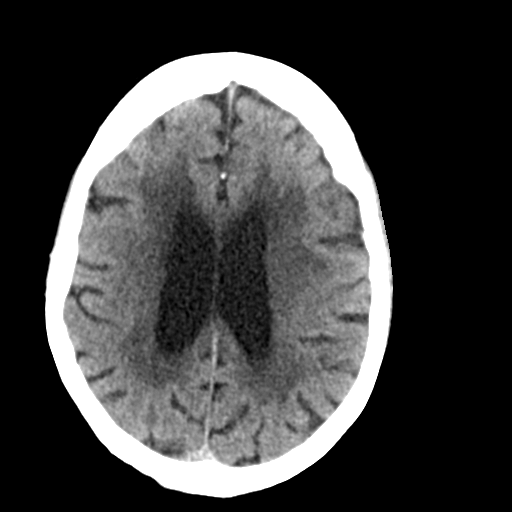
[im 19/31  bone]
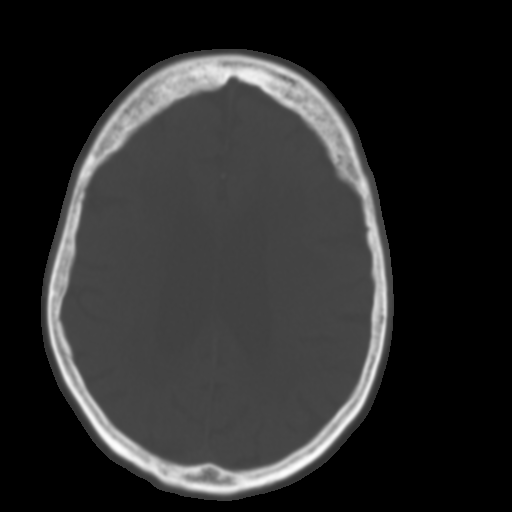
[im 23/31  brain]
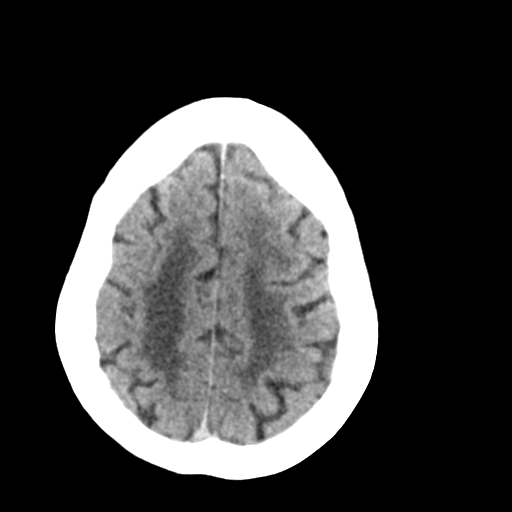
[im 27/31  brain]
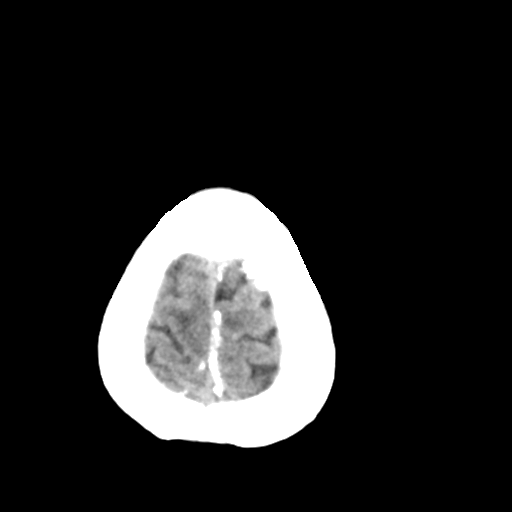

[Series 4: head bone · axial · 0.39mm/px · z∈[+72,+88]mm · 2 of 76 slices shown]
[im 8/76  bone]
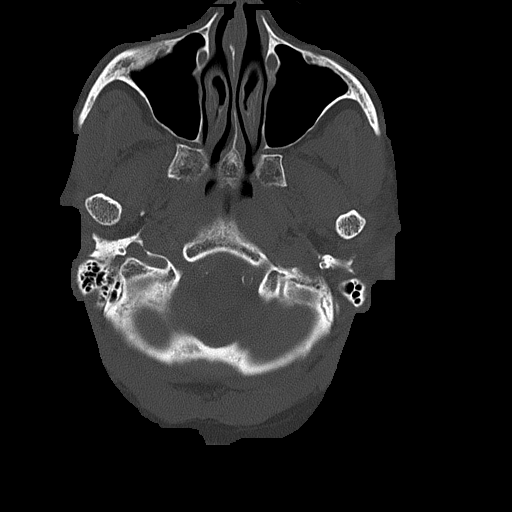
[im 16/76  bone]
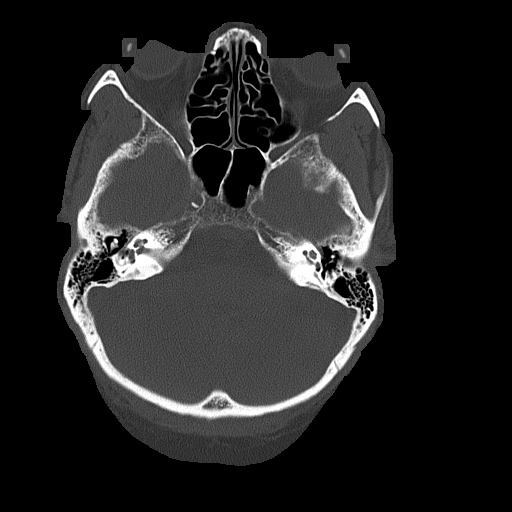

[Series 5: head without cor · coronal · non-contrast · 0.30mm/px · 3 of 64 slices shown]
[im 22/64  brain]
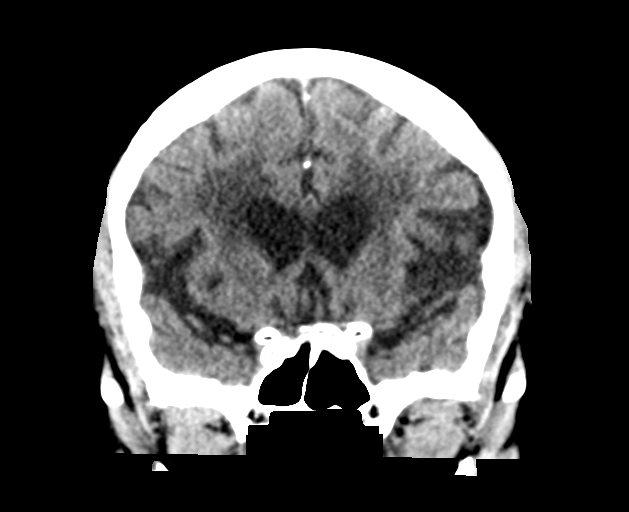
[im 29/64  brain]
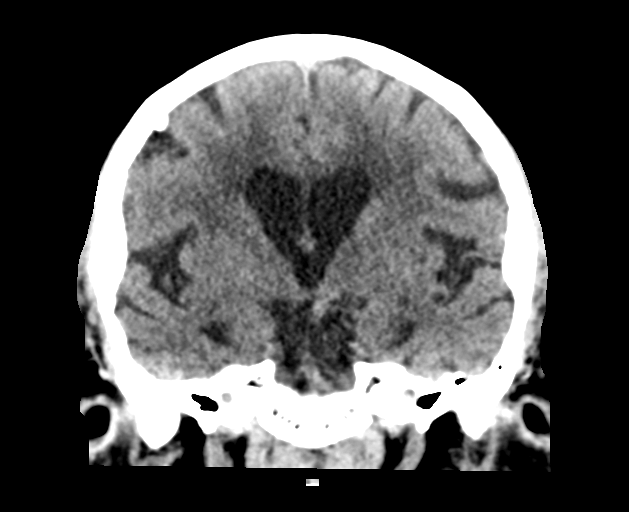
[im 36/64  brain]
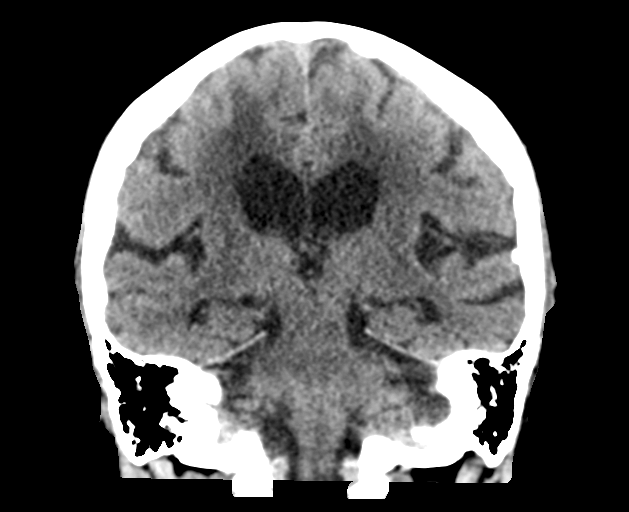

[Series 6: head without sag · sagittal · non-contrast · 0.31mm/px · 3 of 50 slices shown]
[im 17/50  brain]
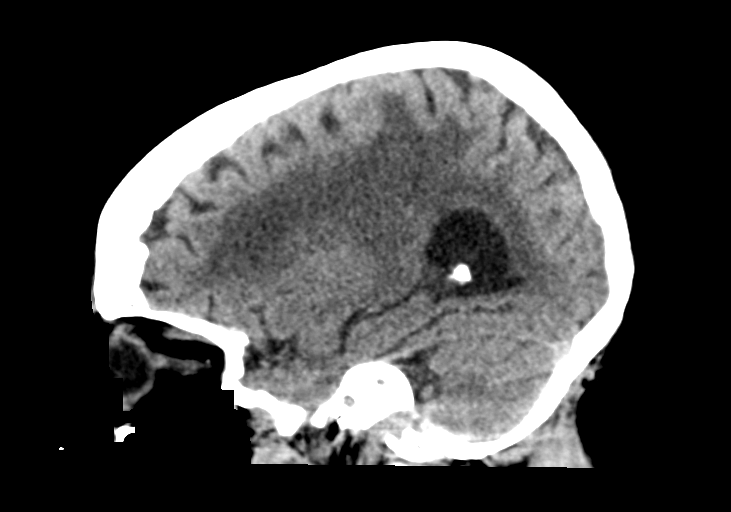
[im 25/50  brain]
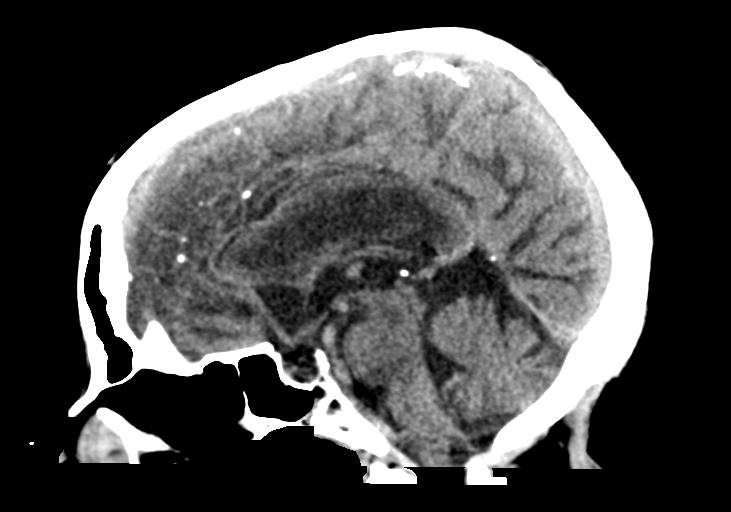
[im 33/50  brain]
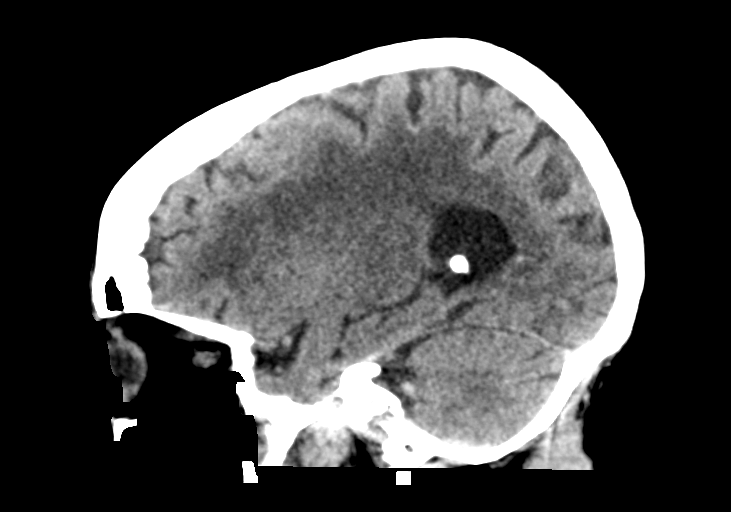

[15 of 47 positions shown; findings below may reference images not displayed]

FINDINGS: Brain: There is mild to moderate diffuse atrophy. There is no
intracranial mass, hemorrhage, extra-axial fluid collection, or
midline shift. There is decreased attenuation throughout portions of
the centra semiovale bilaterally. No acute appearing infarct is
appreciable on this study.

Vascular: No hyperdense vessels. There is calcification in each
carotid siphon region and in each distal vertebral artery.

Skull: Bony calvarium appears intact. There is a small benign right
frontal enostosis.

Sinuses/Orbits: Visualized paranasal sinuses are clear. Orbits
appear symmetric bilaterally.

Other: Mastoid air cells are clear.
IMPRESSION: Stable atrophy with periventricular small vessel disease. No acute
infarct. No mass or hemorrhage.

There are foci of arterial vascular calcification.

## 2020-12-08 MED ORDER — HYDRALAZINE HCL 25 MG PO TABS
25.0000 mg | ORAL_TABLET | Freq: Three times a day (TID) | ORAL | Status: DC
Start: 2020-12-08 — End: 2020-12-10
  Administered 2020-12-08 – 2020-12-10 (×3): 25 mg via ORAL
  Filled 2020-12-08 (×3): qty 1

## 2020-12-08 MED ORDER — DOCUSATE SODIUM 100 MG PO CAPS
100.0000 mg | ORAL_CAPSULE | Freq: Every day | ORAL | Status: DC | PRN
  Filled 2020-12-08: qty 2

## 2020-12-08 MED ORDER — DULOXETINE HCL 60 MG PO CPEP
60.0000 mg | ORAL_CAPSULE | Freq: Every day | ORAL | Status: DC
  Administered 2020-12-08 – 2020-12-10 (×3): 60 mg via ORAL
  Filled 2020-12-08 (×3): qty 1

## 2020-12-08 MED ORDER — LOSARTAN POTASSIUM 50 MG PO TABS
50.0000 mg | ORAL_TABLET | Freq: Every day | ORAL | Status: DC
  Administered 2020-12-08 – 2020-12-10 (×3): 50 mg via ORAL
  Filled 2020-12-08 (×3): qty 1

## 2020-12-08 MED ORDER — VITAMIN D 25 MCG (1000 UNIT) PO TABS
2000.0000 [IU] | ORAL_TABLET | Freq: Every day | ORAL | Status: DC
  Administered 2020-12-08 – 2020-12-10 (×3): 2000 [IU] via ORAL
  Filled 2020-12-08 (×3): qty 2

## 2020-12-08 MED ORDER — ATORVASTATIN CALCIUM 10 MG PO TABS: 40.0000 mg | ORAL_TABLET | Freq: Every evening | ORAL | Status: DC

## 2020-12-08 MED ORDER — INSULIN GLARGINE 100 UNIT/ML ~~LOC~~ SOLN
15.0000 [IU] | Freq: Every day | SUBCUTANEOUS | Status: DC
  Administered 2020-12-09 (×2): 15 [IU] via SUBCUTANEOUS
  Filled 2020-12-08 (×3): qty 0.15

## 2020-12-08 MED ORDER — CALCIUM CARBONATE 1250 (500 CA) MG PO TABS
1.0000 | ORAL_TABLET | Freq: Two times a day (BID) | ORAL | Status: DC
  Administered 2020-12-09 – 2020-12-10 (×2): 500 mg via ORAL
  Filled 2020-12-08 (×4): qty 1

## 2020-12-08 NOTE — Discharge Planning (Signed)
RNCM consulted regarding safe discharge planning (Home with Home Health vs Skilled Nursing Facility Placement).  Physical Therapy evaluation placed; will follow up after recommendations from PT.     

## 2020-12-08 NOTE — ED Notes (Signed)
Checked patient cbg it was 225 notified RN of blood sugar

## 2020-12-08 NOTE — Progress Notes (Signed)
CSW faxed out referrals to many SNFs due to low bed availability.

## 2020-12-08 NOTE — ED Notes (Signed)
NS contacted PT for eval and treat.

## 2020-12-08 NOTE — ED Provider Notes (Signed)
[facility] EMERGENCY DEPARTMENT Provider Note   CSN: [id number] Arrival date & time: 12/07/20  1800     History Chief Complaint  Patient presents with  . Weakness    Holly Valdez is a 85 y.o. female past medical history of dementia, type 2 insulin-dependent diabetes, CKD, hypertension, brought into the ED by goddaughter for assistance with skilled nursing facility placement.  Patient's goddaughter states that she recently relocated the patient to the area in November 2021 from [place].  She has known dementia and has been gradually worsening since that time.  She states over the last 3 weeks she feels as though she has more progressively worsened with her confusion and inability to perform ADLs.  She was admitted in the end of November for intractable vomiting in the setting of hyperglycemia and asymptomatic bacteriuria.  Has been going to outpatient physical therapy since that time is having difficulty ambulating due to generalized weakness.  No sudden change in her mental status, this has been gradual though more progressive.  No fevers, cough, abdominal pain.  No head trauma.  The history is provided by a relative.       Past Medical History:  Diagnosis Date  . Dementia ([place])   . Diabetes mellitus without complication ([place])   . Hypertension     Patient Active Problem List   Diagnosis Date Noted  . Hypovolemia 10/11/2020  . Uncontrolled type 2 diabetes mellitus with hyperglycemia ([place]) 10/11/2020  . Acute kidney injury ([place]) 10/11/2020  . CKD (chronic kidney disease) stage 3, GFR 30-59 ml/min (HCC) 10/11/2020  . Dementia ([place]) 10/11/2020  . UTI (urinary tract infection) 10/10/2020    History reviewed. No pertinent surgical history.   OB History   No obstetric history on file.     History reviewed. No pertinent family history.  Social History   Tobacco Use  . Smoking status: Former Research scientist (life sciences)  . Smokeless tobacco: Never Used  Substance Use  Topics  . Alcohol use: Never  . Drug use: Never    Home Medications Prior to Admission medications   Medication Sig Start Date End Date Taking? Authorizing Provider  atorvastatin (LIPITOR) 40 MG tablet Take 40 mg by mouth every evening. 07/28/16  Yes [provider]  calcium carbonate (OSCAL) 1500 (600 Ca) MG TABS tablet Take 1,500 mg by mouth 2 (two) times daily.   Yes [provider]  Cholecalciferol 50 MCG (2000 UT) CAPS Take 1 capsule by mouth daily.   Yes [provider]  Continuous Blood Gluc Receiver (FREESTYLE LIBRE 2 READER) DEVI 1 each by Does not apply route.   Yes [provider]  Continuous Blood Gluc Sensor (FREESTYLE LIBRE 2 SENSOR) MISC 1 each by Does not apply route.   Yes [provider]  Docusate Sodium (DSS) 100 MG CAPS Take 100 mg by mouth daily as needed (constipation). 06/03/20  Yes [provider]  DULoxetine (CYMBALTA) 60 MG capsule Take 1 capsule by mouth daily. 09/15/20  Yes [provider]  hydrALAZINE (APRESOLINE) 25 MG tablet Take 25 mg by mouth 3 (three) times daily. 01/10/19  Yes [provider]  insulin glargine (LANTUS) 100 UNIT/ML Solostar Pen Inject 15 Units into the skin daily. 10/15/20 11/14/20 Yes Iona Beard, MD  losartan (COZAAR) 100 MG tablet Take 100 mg by mouth daily. 02/13/18  Yes [provider]  Insulin Pen Needle (INSUPEN PEN NEEDLES) 32G X 4 MM MISC 1 Units by Does not apply route daily. 10/15/20 10/15/21  Iona Beard, MD    Allergies    Penicillins  Review of Systems   Review of Systems  Neurological: Positive for weakness (generalized).  Psychiatric/Behavioral: Positive for confusion.  All other systems reviewed and are negative.   Physical Exam Updated Vital Signs BP (!) 142/76 (BP Location: Right Arm)   Pulse (!) 58   Temp 97.7 F (36.5 C) (Oral)   Resp 12   SpO2 100%   Physical Exam Vitals and nursing note reviewed.  Constitutional:      General:  She is not in acute distress.    Appearance: She is well-developed and well-nourished.  HENT:     Head: Normocephalic and atraumatic.  Eyes:     Conjunctiva/sclera: Conjunctivae normal.  Cardiovascular:     Rate and Rhythm: Regular rhythm. Bradycardia present.     Comments: Palpable DP pulses BLE, palpable radial pulses BUE Pulmonary:     Effort: Pulmonary effort is normal. No respiratory distress.     Breath sounds: Normal breath sounds.  Abdominal:     General: Bowel sounds are normal.     Palpations: Abdomen is soft.     Tenderness: There is no abdominal tenderness. There is no guarding or rebound.  Musculoskeletal:     Right lower leg: Edema (trace) present.     Left lower leg: Edema (trace) present.  Skin:    General: Skin is warm.  Neurological:     Mental Status: She is alert. Mental status is at baseline.     Comments: Patient is oriented to place and name.  Detailed neuro exam difficult to obtain as patient is able to follow simple commands due to baseline confusion.  She is spontaneously moving all extremities.  Seemingly equal grip strength to bilateral upper extremities, with plantar flexion of bilateral lower extremities.  No obvious cranial nerve deficits: No facial asymmetry.  EOMs grossly normal.  Psychiatric:        Mood and Affect: Mood and affect normal.        Behavior: Behavior normal.     ED Results / Procedures / Treatments   Labs (all labs ordered are listed, but only abnormal results are displayed) Labs Reviewed  COMPREHENSIVE METABOLIC PANEL - Abnormal; Notable for the following components:      Result Value   Glucose, Bld 271 (*)    Creatinine, Ser 1.40 (*)    GFR, Estimated 38 (*)    All other components within normal limits  CBG MONITORING, ED - Abnormal; Notable for the following components:   Glucose-Capillary 225 (*)    All other components within normal limits  SARS CORONAVIRUS 2 BY RT PCR (HOSPITAL ORDER, [place]  LAB)  CBC  URINALYSIS, ROUTINE W REFLEX MICROSCOPIC    EKG EKG Interpretation  Date/Time:  Monday December 07 2020 18:30:14 EST Ventricular Rate:  68 PR Interval:  152 QRS Duration: 74 QT Interval:  388 QTC Calculation: 412 R Axis:   16 Text Interpretation: Normal sinus rhythm Cannot rule out Anterior infarct , age undetermined Abnormal ECG When compared with ECG of 10/11/2020, No significant change was found Confirmed by Delora Fuel ([id number]) on 12/08/2020 12:08:57 AM   Radiology CT Head Wo Contrast  Result Date: 12/08/2020 CLINICAL DATA:  Altered mental status EXAM: CT HEAD WITHOUT CONTRAST TECHNIQUE: Contiguous axial images were obtained from the base of the skull through the vertex without intravenous contrast. COMPARISON:  None. FINDINGS: Brain: There is mild to moderate diffuse atrophy. There is no intracranial  mass, hemorrhage, extra-axial fluid collection, or midline shift. There is decreased attenuation throughout portions of the centra semiovale bilaterally. No acute appearing infarct is appreciable on this study. Vascular: No hyperdense vessels. There is calcification in each carotid siphon region and in each distal vertebral artery. Skull: Bony calvarium appears intact. There is a small benign right frontal enostosis. Sinuses/Orbits: Visualized paranasal sinuses are clear. Orbits appear symmetric bilaterally. Other: Mastoid air cells are clear. IMPRESSION: Stable atrophy with periventricular small vessel disease. No acute infarct. No mass or hemorrhage. There are foci of arterial vascular calcification. Electronically Signed   By: Lowella Grip III M.D.   On: 12/08/2020 11:07    Procedures Procedures   Medications Ordered in ED Medications - No data to display  ED Course  I have reviewed the triage vital signs and the nursing notes.  Pertinent labs & imaging results that were available during my care of the patient were reviewed by me and considered in my medical  decision making (see chart for details).    MDM Rules/Calculators/A&P                          Patient with known history of dementia presenting for progressively worsening dementia affecting ADLs.  Family is requesting assistance with SNF placement. Family is open to discharge to home pending placement.  No acute changes in the last couple of days.  Blood work obtained in triage is unremarkable for acute significant change.  CT head obtained as patient has not had any documented head CTs recently and is negative.  Pending case management/social work consultation and assistance.  PT evaluated patient and recommends supervision/assistance 24 hours.  Case management and social work to talk to family to develop plan for disposition.  Final Clinical Impression(s) / ED Diagnoses Final diagnoses:  Dementia without behavioral disturbance, unspecified dementia type [facility])    Rx / DC Orders ED Discharge Orders    None       Oasis Goehring, [place] N, PA-C 12/08/20 1805    Charlesetta Shanks, MD 12/08/20 1919

## 2020-12-08 NOTE — NC FL2 (Signed)
[place] LEVEL OF CARE SCREENING TOOL     IDENTIFICATION  Patient Name: Holly Valdez Birthdate: [birth year] Sex: female Admission Date (Current Location): 12/07/2020  [facility] and [place] Number:  Herbalist and Address:  [facility], [place] [address], [place], [place] [address]      Provider Number: [id number]  Attending Physician Name and Address:  Charlesetta Shanks, MD  Relative Name and Phone Number:  Ginger Organ Daughter   [phone]    Current Level of Care: Hospital Recommended Level of Care: [place] Prior Approval Number:    Date Approved/Denied:   PASRR Number: [id number]  Discharge Plan: SNF    Current Diagnoses: Patient Active Problem List   Diagnosis Date Noted  . Hypovolemia 10/11/2020  . Uncontrolled type 2 diabetes mellitus with hyperglycemia ([place]) 10/11/2020  . Acute kidney injury ([place]) 10/11/2020  . CKD (chronic kidney disease) stage 3, GFR 30-59 ml/min (HCC) 10/11/2020  . Dementia ([place]) 10/11/2020  . UTI (urinary tract infection) 10/10/2020    Orientation RESPIRATION BLADDER Height & Weight     Self,Time,Place  Normal Incontinent Weight:   Height:     BEHAVIORAL SYMPTOMS/MOOD NEUROLOGICAL BOWEL NUTRITION STATUS      Incontinent Diet (Diabetic friendly)  AMBULATORY STATUS COMMUNICATION OF NEEDS Skin   Extensive Assist Verbally Normal                       Personal Care Assistance Level of Assistance  Bathing,Feeding,Dressing Bathing Assistance: Maximum assistance Feeding assistance: Independent Dressing Assistance: Maximum assistance     Functional Limitations Info  Sight,Hearing,Speech Sight Info: Adequate Hearing Info: Adequate Speech Info: Adequate    SPECIAL CARE FACTORS FREQUENCY  PT (By licensed PT),OT (By licensed OT)     PT Frequency: 5x weekly OT Frequency: 5 x weekly            Contractures Contractures Info: Not present    Additional  Factors Info  Code Status,Allergies Code Status Info: DNR Allergies Info: Penicillins           Current Medications (12/08/2020):  This is the current hospital active medication list Current Facility-Administered Medications  Medication Dose Route Frequency Provider Last Rate Last Admin  . calcium carbonate (OSCAL) tablet 1,500 mg  1,500 mg Oral BID Charlesetta Shanks, MD      . cholecalciferol (VITAMIN D3) tablet 2,000 Units  2,000 Units Oral Daily Pfeiffer, Marcy, MD      . docusate sodium (COLACE) capsule 100 mg  100 mg Oral Daily PRN Charlesetta Shanks, MD      . DULoxetine (CYMBALTA) DR capsule 60 mg  60 mg Oral Daily Pfeiffer, Marcy, MD      . hydrALAZINE (APRESOLINE) tablet 25 mg  25 mg Oral TID Charlesetta Shanks, MD      . insulin glargine (LANTUS) injection 15 Units  15 Units Subcutaneous QHS Pfeiffer, Marcy, MD      . losartan (COZAAR) tablet 50 mg  50 mg Oral Daily Charlesetta Shanks, MD       Current Outpatient Medications  Medication Sig Dispense Refill  . atorvastatin (LIPITOR) 40 MG tablet Take 40 mg by mouth every evening.    . calcium carbonate (OSCAL) 1500 (600 Ca) MG TABS tablet Take 1,500 mg by mouth 2 (two) times daily.    . Cholecalciferol 50 MCG (2000 UT) CAPS Take 1 capsule by mouth daily.    . Continuous Blood Gluc Receiver (FREESTYLE LIBRE 2 READER)  DEVI 1 each by Does not apply route.    . Continuous Blood Gluc Sensor (FREESTYLE LIBRE 2 SENSOR) MISC 1 each by Does not apply route.    Mariane Baumgarten Sodium (DSS) 100 MG CAPS Take 100 mg by mouth daily as needed (constipation).    . DULoxetine (CYMBALTA) 60 MG capsule Take 1 capsule by mouth daily.    . hydrALAZINE (APRESOLINE) 25 MG tablet Take 25 mg by mouth 3 (three) times daily.    . insulin glargine (LANTUS) 100 UNIT/ML Solostar Pen Inject 15 Units into the skin daily. 15 mL 0  . losartan (COZAAR) 100 MG tablet Take 100 mg by mouth daily.    . Insulin Pen Needle (INSUPEN PEN NEEDLES) 32G X 4 MM MISC 1 Units by Does not  apply route daily. 30 each 0     Discharge Medications: Please see discharge summary for a list of discharge medications.  Relevant Imaging Results:  Relevant Lab Results:   Additional Information SS# [id number] Pt reports having Covid vaccines  Debbrah Sampedro, LCSW

## 2020-12-08 NOTE — ED Notes (Signed)
Pt to CT scan.

## 2020-12-08 NOTE — ED Notes (Signed)
PT arrived to pt room.

## 2020-12-08 NOTE — Progress Notes (Signed)
Patient here with God-daughter who shares patient has progressively become less independent over last several weeks, unable to perform ADLs and God-daughter reports increased difficulty in caring for her independently.  Per God-daughter "social services" told her to bring her to the ED for placement.

## 2020-12-08 NOTE — ED Notes (Signed)
Back from CT

## 2020-12-08 NOTE — ED Notes (Signed)
Got patient undress on the monitor patient is resting with call bell in reach and family at bedside 

## 2020-12-08 NOTE — ED Notes (Signed)
Dinner tray ordered.

## 2020-12-08 NOTE — Evaluation (Signed)
Physical Therapy Evaluation Patient Details Name: Holly Valdez MRN: [id number] DOB: 01-29-1936 Today's Date: 12/08/2020   History of Present Illness  Patient admitted from home, Pt having increase in weakness, unable to ambulate at home and increase in confusion. POA is Art therapist and she is requesting NH placement.  Clinical Impression  Patient received resting on stretcher. She is HOH and confused at this time. Each time asked to perform mobility task she replies "that's kind of hard right now." Attempted sitting up on side of bed with total assist. Patient unable to attain sitting balance. She will continue to benefit from skilled PT while here to improve strength and functional independence to reduce caregiver burden.     Follow Up Recommendations SNF;Supervision/Assistance - 24 hour    Equipment Recommendations  Other (comment) (TBD)    Recommendations for Other Services       Precautions / Restrictions Precautions Precautions: Fall Restrictions Weight Bearing Restrictions: No      Mobility  Bed Mobility Overal bed mobility: Needs Assistance Bed Mobility: Supine to Sit;Sit to Supine     Supine to sit: Max assist;Total assist Sit to supine: Max assist;Total assist   General bed mobility comments: Patient assisted to edge of bed with max assist. Unable to sit unsupported.    Transfers                 General transfer comment: Not assessed  Ambulation/Gait             General Gait Details: Not assessed  Stairs            Wheelchair Mobility    Modified Rankin (Stroke Patients Only)       Balance Overall balance assessment: Needs assistance   Sitting balance-Leahy Scale: Zero Sitting balance - Comments: requires total assist to maintain partial sitting at edge of bed. Postural control: Posterior lean                                   Pertinent Vitals/Pain Pain Assessment: Faces Faces Pain Scale: Hurts little  more Pain Location: reports pain with moving legs off bed Pain Descriptors / Indicators: Guarding;Grimacing;Discomfort Pain Intervention(s): Monitored during session;Repositioned    Home Living Family/patient expects to be discharged to:: Skilled nursing facility Living Arrangements: Other relatives   Type of Home: House           Additional Comments: unsure of home envirmonment at this time. Chart states she lives alone, with god-daughter assisting. Patient states she has caregivers who help with bathing, dressing, getting groceries.    Prior Function Level of Independence: Needs assistance               Hand Dominance        Extremity/Trunk Assessment   Upper Extremity Assessment Upper Extremity Assessment: Generalized weakness;RUE deficits/detail;LUE deficits/detail;Difficult to assess due to impaired cognition RUE Deficits / Details: patient appears to have limited rom of B shoulders. L>R. She does not move either UE much on her own. RUE Coordination: decreased gross motor LUE Coordination: decreased gross motor    Lower Extremity Assessment Lower Extremity Assessment: Generalized weakness;RLE deficits/detail;LLE deficits/detail;Difficult to assess due to impaired cognition RLE Deficits / Details: patient has normal ROM of B LEs does not move much at all when asked to do so. states "that's kind of hard" RLE Coordination: decreased gross motor LLE Coordination: decreased gross motor  Communication   Communication: HOH  Cognition Arousal/Alertness: Awake/alert Behavior During Therapy: WFL for tasks assessed/performed Overall Cognitive Status: No family/caregiver present to determine baseline cognitive functioning                                 General Comments: patient is confused      General Comments      Exercises Total Joint Exercises Ankle Circles/Pumps: PROM;Both;5 reps Heel Slides: PROM;Both;5 reps Hip ABduction/ADduction:  PROM;Both;5 reps Straight Leg Raises: PROM;Both;5 reps   Assessment/Plan    PT Assessment Patient needs continued PT services  PT Problem List Decreased strength;Decreased mobility;Decreased activity tolerance;Decreased balance;Decreased cognition;Pain       PT Treatment Interventions DME instruction;Therapeutic activities;Gait training;Therapeutic exercise;Functional mobility training;Balance training;Patient/family education    PT Goals (Current goals can be found in the Care Plan section)  Acute Rehab PT Goals Patient Stated Goal: to get warm Time For Goal Achievement: 12/21/20 Potential to Achieve Goals: Good    Frequency Min 2X/week   Barriers to discharge Decreased caregiver support      Co-evaluation               AM-PAC PT "6 Clicks" Mobility  Outcome Measure Help needed turning from your back to your side while in a flat bed without using bedrails?: Total Help needed moving from lying on your back to sitting on the side of a flat bed without using bedrails?: Total Help needed moving to and from a bed to a chair (including a wheelchair)?: Total Help needed standing up from a chair using your arms (e.g., wheelchair or bedside chair)?: Total Help needed to walk in hospital room?: Total Help needed climbing 3-5 steps with a railing? : Total 6 Click Score: 6    End of Session   Activity Tolerance: Patient limited by fatigue;Other (comment) (confusion) Patient left: in bed;with call bell/phone within reach Nurse Communication: Mobility status PT Visit Diagnosis: Muscle weakness (generalized) (M62.81);Other abnormalities of gait and mobility (R26.89);Pain Pain - part of body: Leg    Time: 1310-1327 PT Time Calculation (min) (ACUTE ONLY): 17 min   Charges:   PT Evaluation $PT Eval Moderate Complexity: 1 Mod          Jess Toney, PT, GCS 12/08/20,3:09 PM

## 2020-12-09 ENCOUNTER — Other Ambulatory Visit: Payer: Self-pay

## 2020-12-09 DIAGNOSIS — F0390 Unspecified dementia without behavioral disturbance: Secondary | ICD-10-CM | POA: Diagnosis not present

## 2020-12-09 LAB — URINALYSIS, ROUTINE W REFLEX MICROSCOPIC
Bilirubin Urine: NEGATIVE
Glucose, UA: NEGATIVE mg/dL
Ketones, ur: 5 mg/dL — AB
Nitrite: NEGATIVE
Protein, ur: 30 mg/dL — AB
Specific Gravity, Urine: 1.023 (ref 1.005–1.030)
pH: 6 (ref 5.0–8.0)

## 2020-12-09 LAB — CBG MONITORING, ED
Glucose-Capillary: 144 mg/dL — ABNORMAL HIGH (ref 70–99)
Glucose-Capillary: 141 mg/dL — ABNORMAL HIGH (ref 70–99)

## 2020-12-09 MED ORDER — ACETAMINOPHEN 500 MG PO TABS
1000.0000 mg | ORAL_TABLET | Freq: Once | ORAL | Status: AC
  Administered 2020-12-09: 1000 mg via ORAL
  Filled 2020-12-09: qty 2

## 2020-12-09 NOTE — Progress Notes (Signed)
CSW spoke with Goddaughter Ginger Organ to discuss bed offers.  Pt has stated that Ms. Nicki Reaper will make decisions in regards to placement.   Choice has been made to accept placement at [facility]  CSW spoke with [place] at [facility] to begin process of transition.  Loma Boston states she will call back to arrange details.

## 2020-12-09 NOTE — ED Notes (Signed)
Lunch Tray Ordered @ 1032. 

## 2020-12-10 DIAGNOSIS — F0390 Unspecified dementia without behavioral disturbance: Secondary | ICD-10-CM | POA: Diagnosis not present

## 2020-12-10 LAB — CBG MONITORING, ED: Glucose-Capillary: 140 mg/dL — ABNORMAL HIGH (ref 70–99)

## 2020-12-10 NOTE — Progress Notes (Addendum)
4:02 CSW received message from [place] that room has been changed to [address] Floor RN updated.       CSW received confirmation from [place] that Pt will be ready for transfer to facility.   # for report [phone] Pt to go to Rm # 225 B ask for Nurse Claiborne Rigg   CSW informed Actuary for  [employer].

## 2020-12-10 NOTE — Discharge Instructions (Addendum)
Follow up as instructed by behavior health or social worker

## 2020-12-10 NOTE — Progress Notes (Signed)
TOC CM/CSW spoke with [facility] [place] [phone].  Carolynn Serve stated she is waiting on approval from management.  Carolynn Serve will call CSW with admit information later today.  Marlicia Sroka Tarpley-Carter, MSW, LCSW-A Pronouns:  She, Her, Hers                  [facility] ED Transitions of CareClinical Social Worker [email or web address] [phone]

## 2020-12-11 NOTE — Progress Notes (Signed)
12/11/2020 @ 9:15am  TOC CM/CSW received a call from [facility] in need of paperwork.  CSW faxed dr's note, PT note, and MAR [phone].  CSW will continue to follow for dc needs.  Emerson Schreifels Tarpley-Carter, MSW, LCSW-A Pronouns:  She, Her, [place] ED Transitions of CareClinical Social Worker [email or web address] [phone]

## 2021-03-10 ENCOUNTER — Emergency Department (HOSPITAL_COMMUNITY)
Admission: EM | Admit: 2021-03-10 | Discharge: 2021-03-10 | Disposition: A | Discharge status: Home / Self Care | Payer: Medicare Other | Attending: Emergency Medicine | Admitting: Emergency Medicine

## 2021-03-10 ENCOUNTER — Emergency Department (HOSPITAL_COMMUNITY): Payer: Medicare Other

## 2021-03-10 ENCOUNTER — Encounter (HOSPITAL_COMMUNITY): Payer: Self-pay | Admitting: Emergency Medicine

## 2021-03-10 DIAGNOSIS — Z794 Long term (current) use of insulin: Secondary | ICD-10-CM | POA: Insufficient documentation

## 2021-03-10 DIAGNOSIS — I129 Hypertensive chronic kidney disease with stage 1 through stage 4 chronic kidney disease, or unspecified chronic kidney disease: Secondary | ICD-10-CM | POA: Insufficient documentation

## 2021-03-10 DIAGNOSIS — Z79899 Other long term (current) drug therapy: Secondary | ICD-10-CM | POA: Diagnosis not present

## 2021-03-10 DIAGNOSIS — F0390 Unspecified dementia without behavioral disturbance: Secondary | ICD-10-CM | POA: Insufficient documentation

## 2021-03-10 DIAGNOSIS — M79622 Pain in left upper arm: Secondary | ICD-10-CM | POA: Diagnosis not present

## 2021-03-10 DIAGNOSIS — M25512 Pain in left shoulder: Secondary | ICD-10-CM | POA: Insufficient documentation

## 2021-03-10 DIAGNOSIS — N183 Chronic kidney disease, stage 3 unspecified: Secondary | ICD-10-CM | POA: Diagnosis not present

## 2021-03-10 DIAGNOSIS — E1122 Type 2 diabetes mellitus with diabetic chronic kidney disease: Secondary | ICD-10-CM | POA: Insufficient documentation

## 2021-03-10 DIAGNOSIS — Z87891 Personal history of nicotine dependence: Secondary | ICD-10-CM | POA: Insufficient documentation

## 2021-03-10 DIAGNOSIS — M79602 Pain in left arm: Secondary | ICD-10-CM

## 2021-03-10 DIAGNOSIS — W050XXA Fall from non-moving wheelchair, initial encounter: Secondary | ICD-10-CM | POA: Insufficient documentation

## 2021-03-10 IMAGING — CR DG ELBOW COMPLETE 3+V*L*
4 series · 4 of 4 positions shown · non-contrast
Comparison: None.

CLINICAL DATA: Left elbow and forearm pain after a fall. Initial
encounter.

EXAM:
LEFT ELBOW - COMPLETE 3+ VIEW

[x elbow ap left]
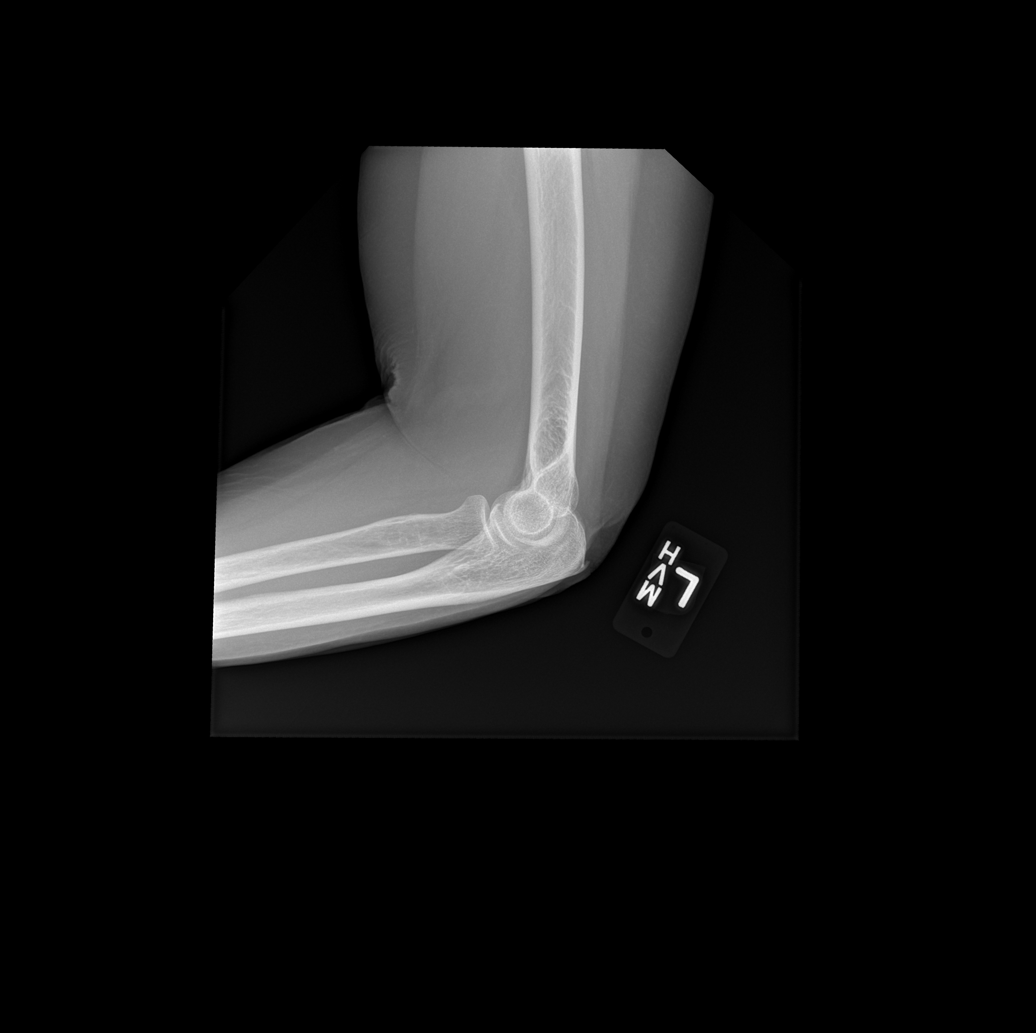

[x forearm ap left]
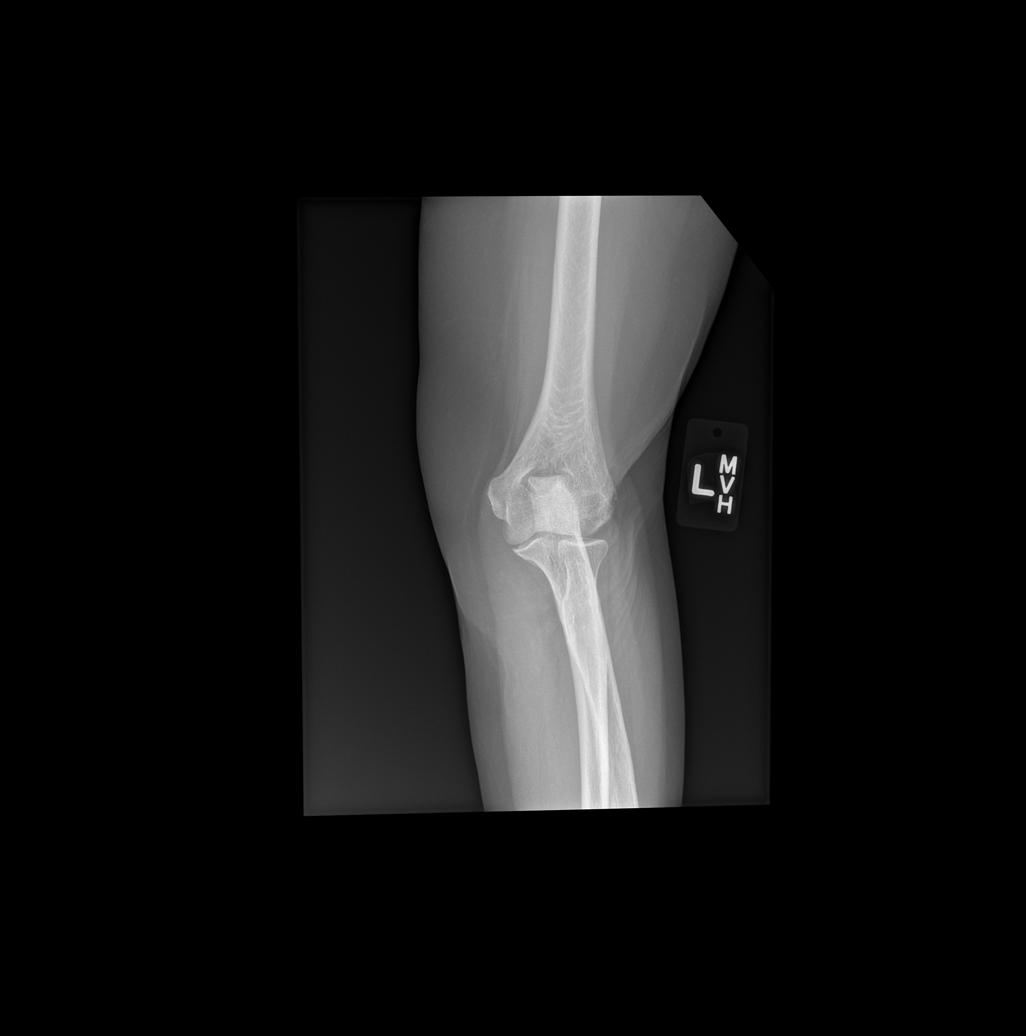

[x elbow obl left (1 of 2)]
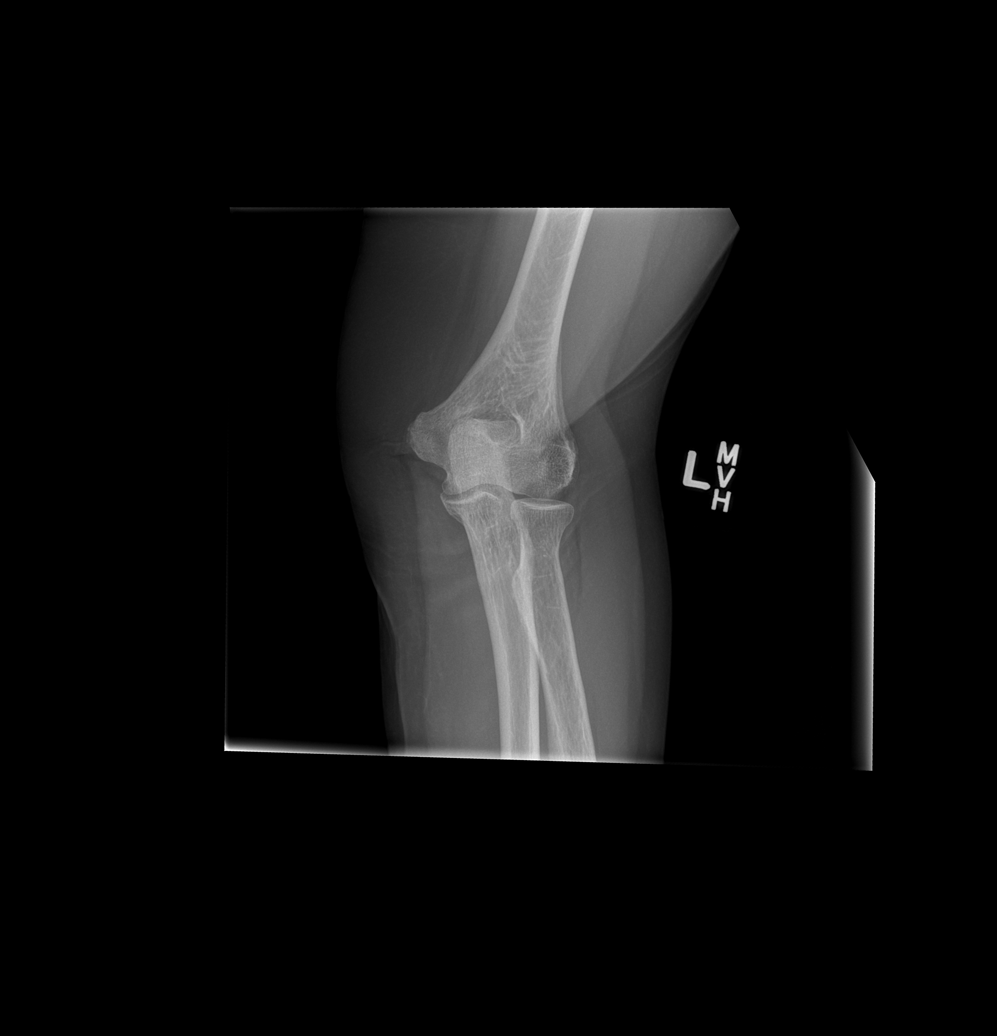

[x elbow obl left (2 of 2)]
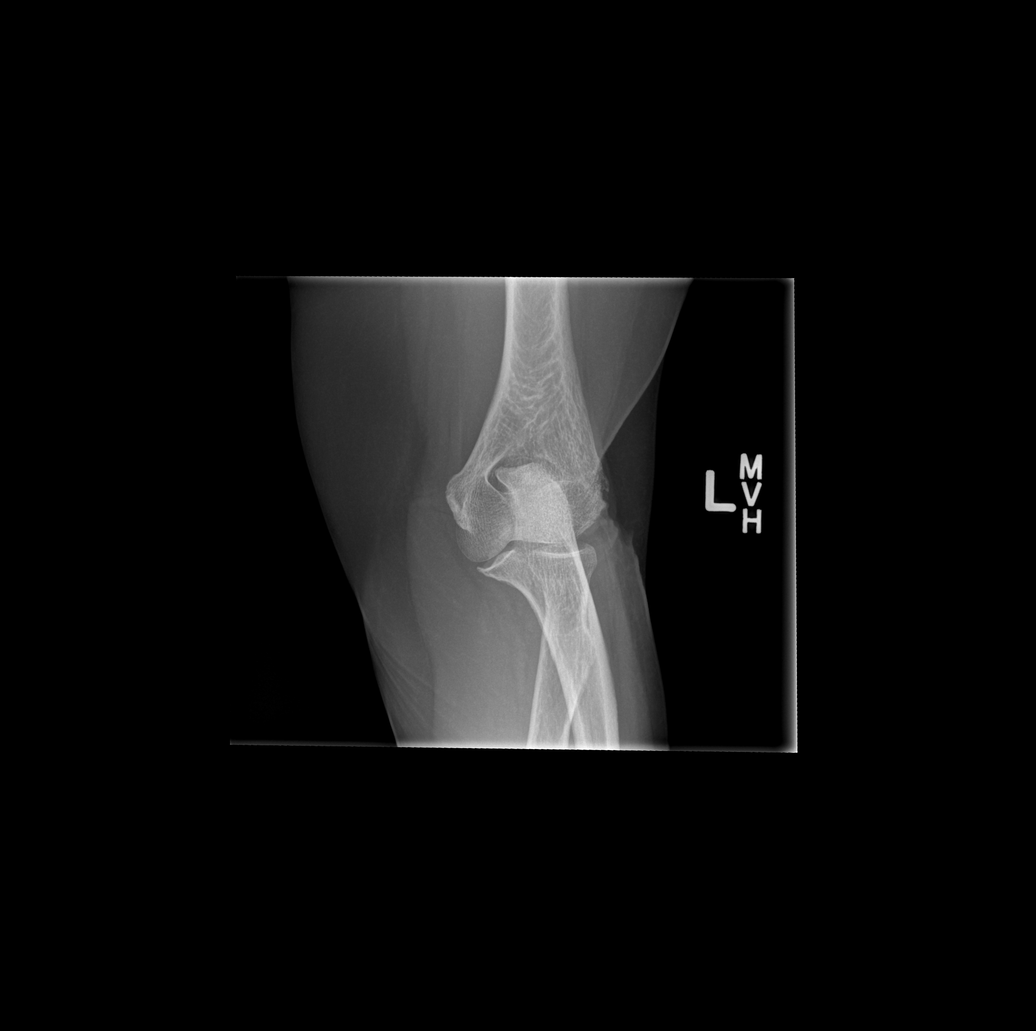

[4 of 4 positions shown; findings below may reference images not displayed]

FINDINGS: There is no evidence of fracture, dislocation, or joint effusion.
There is no evidence of arthropathy or other focal bone abnormality.
Soft tissues are unremarkable.
IMPRESSION: Negative exam.

## 2021-03-10 IMAGING — CR DG TIBIA/FIBULA 2V*R*
4 series · 4 of 4 positions shown · non-contrast
Comparison: None.

CLINICAL DATA: Pain following fall

EXAM:
RIGHT TIBIA AND FIBULA - 2 VIEW

[x tib-fib ap right]
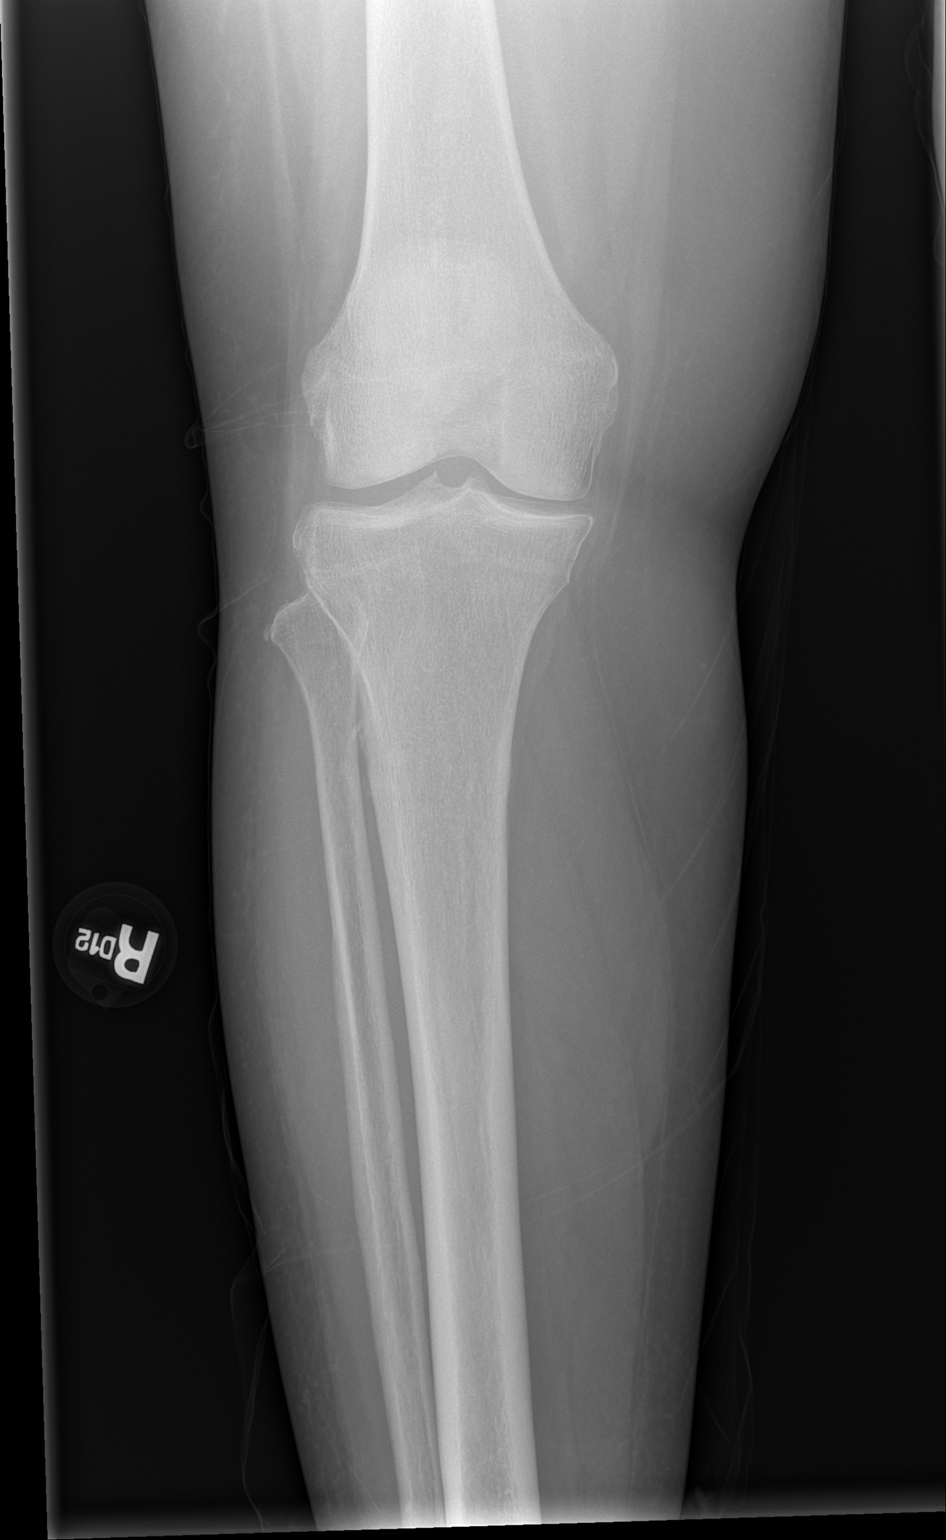

[x tib-fib lat right (1 of 3)]
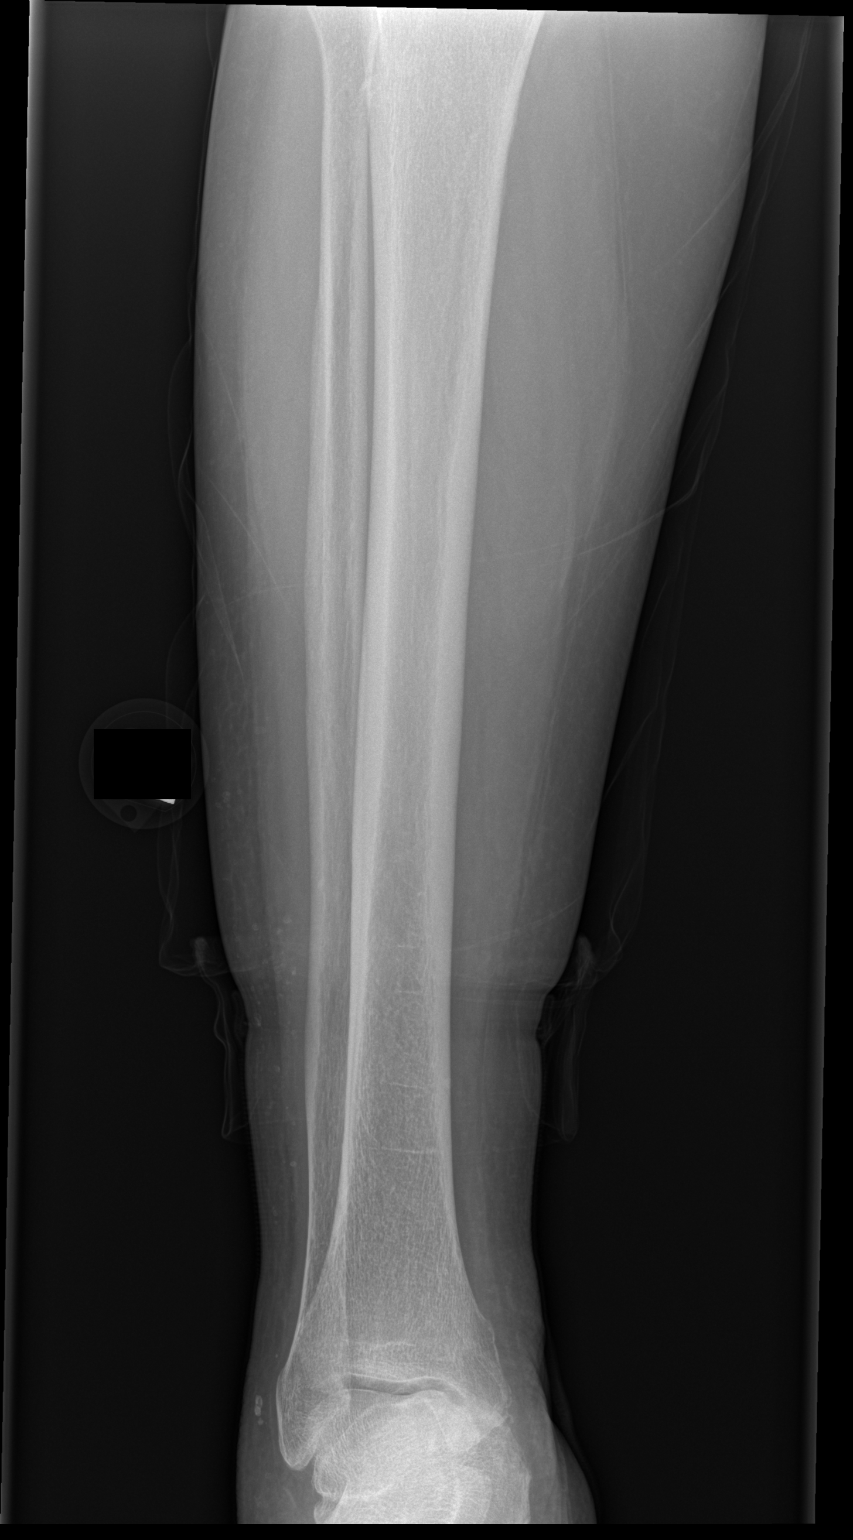

[x tib-fib lat right (2 of 3)]
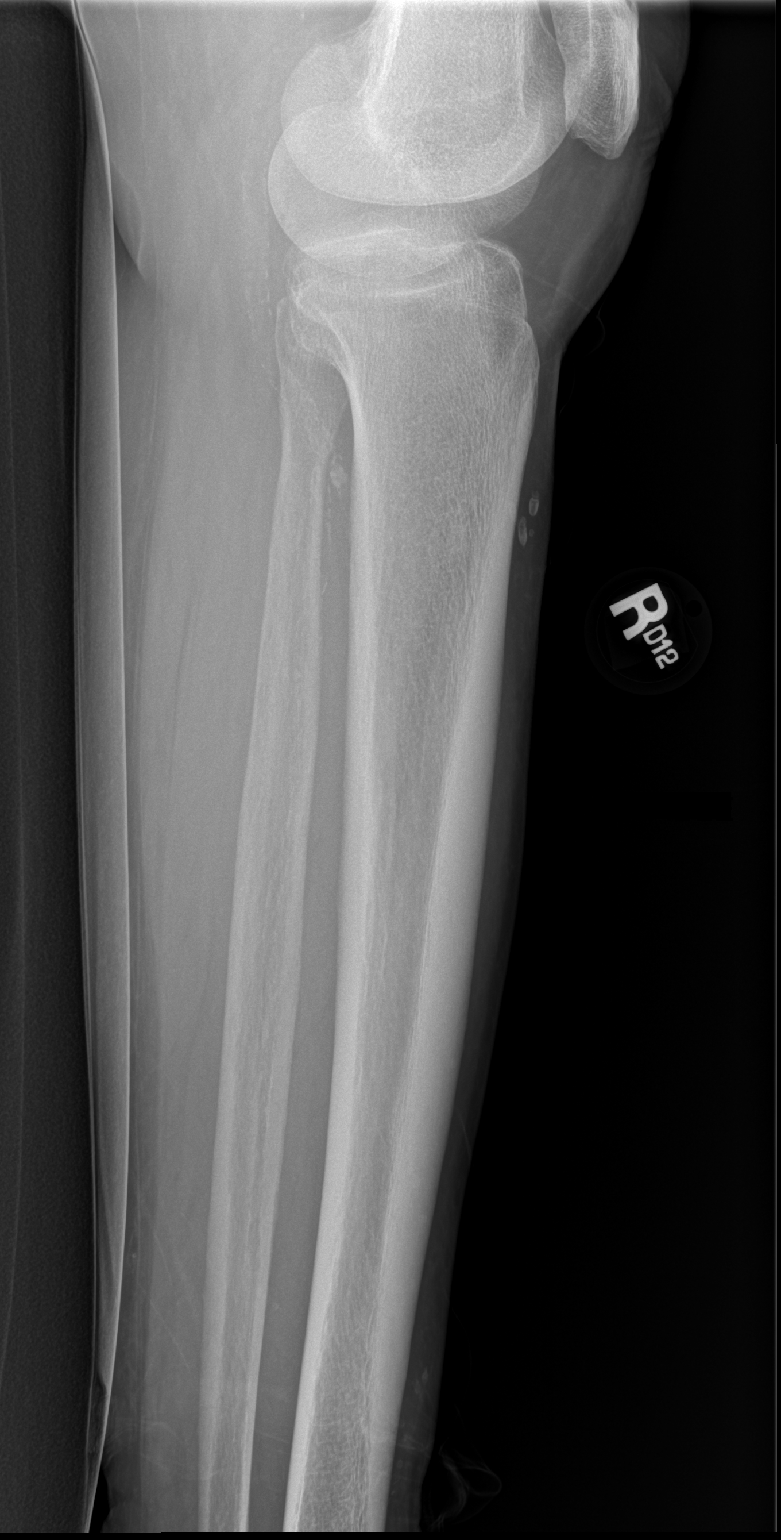

[x tib-fib lat right (3 of 3)]
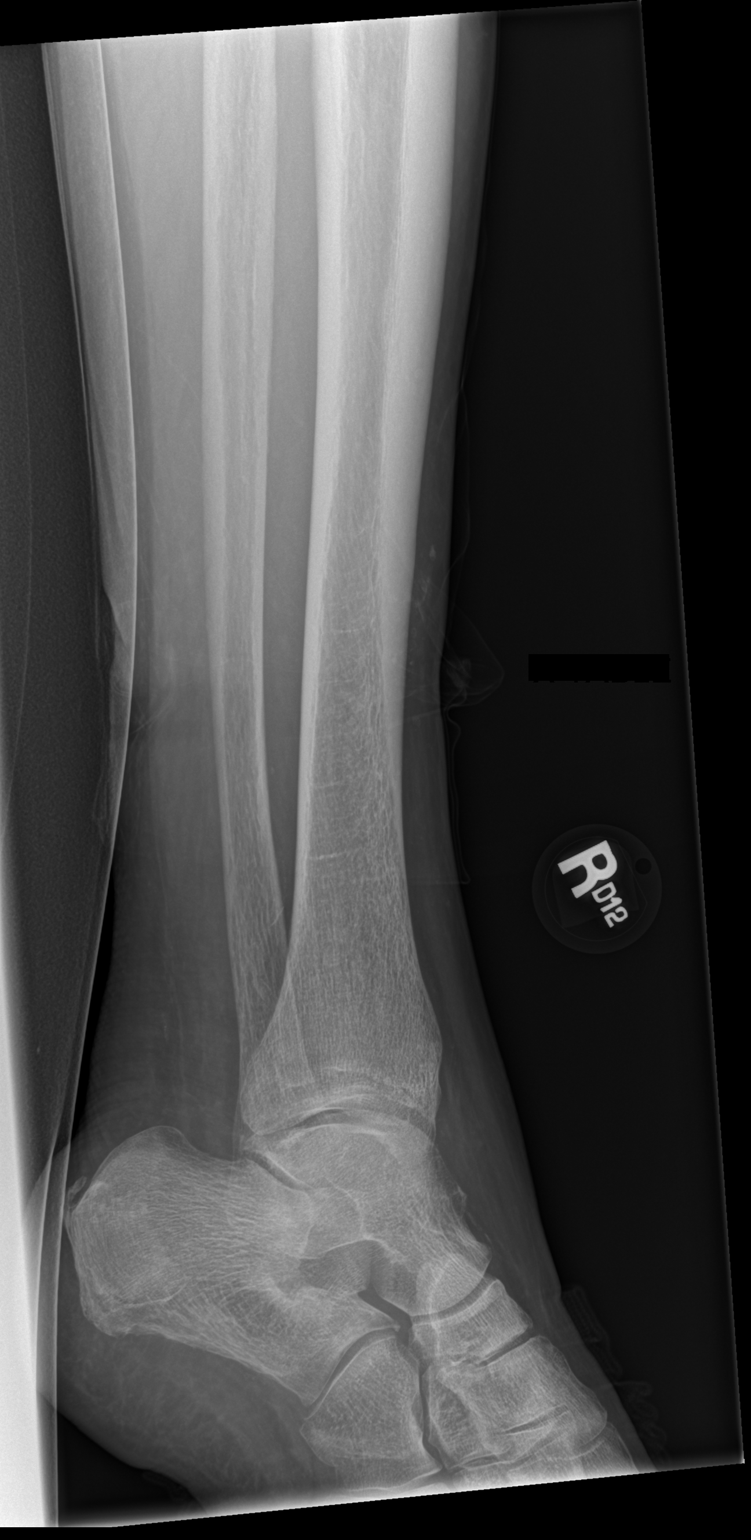

[4 of 4 positions shown; findings below may reference images not displayed]

FINDINGS: Frontal and lateral views were obtained. No fracture or dislocation.
Joint spaces appear unremarkable. There is a small posterior
calcaneal spur. There are foci of arterial vascular calcification.
IMPRESSION: No fracture or dislocation. No appreciable joint space narrowing.
Small posterior calcaneal spur. Foci of arterial vascular
atherosclerotic calcification.

## 2021-03-10 IMAGING — CR DG HUMERUS 2V *L*
2 series · 2 of 2 positions shown · non-contrast
Comparison: None.

CLINICAL DATA: Pain following fall

EXAM:
LEFT HUMERUS - 2+ VIEW

[x humerus ap left]
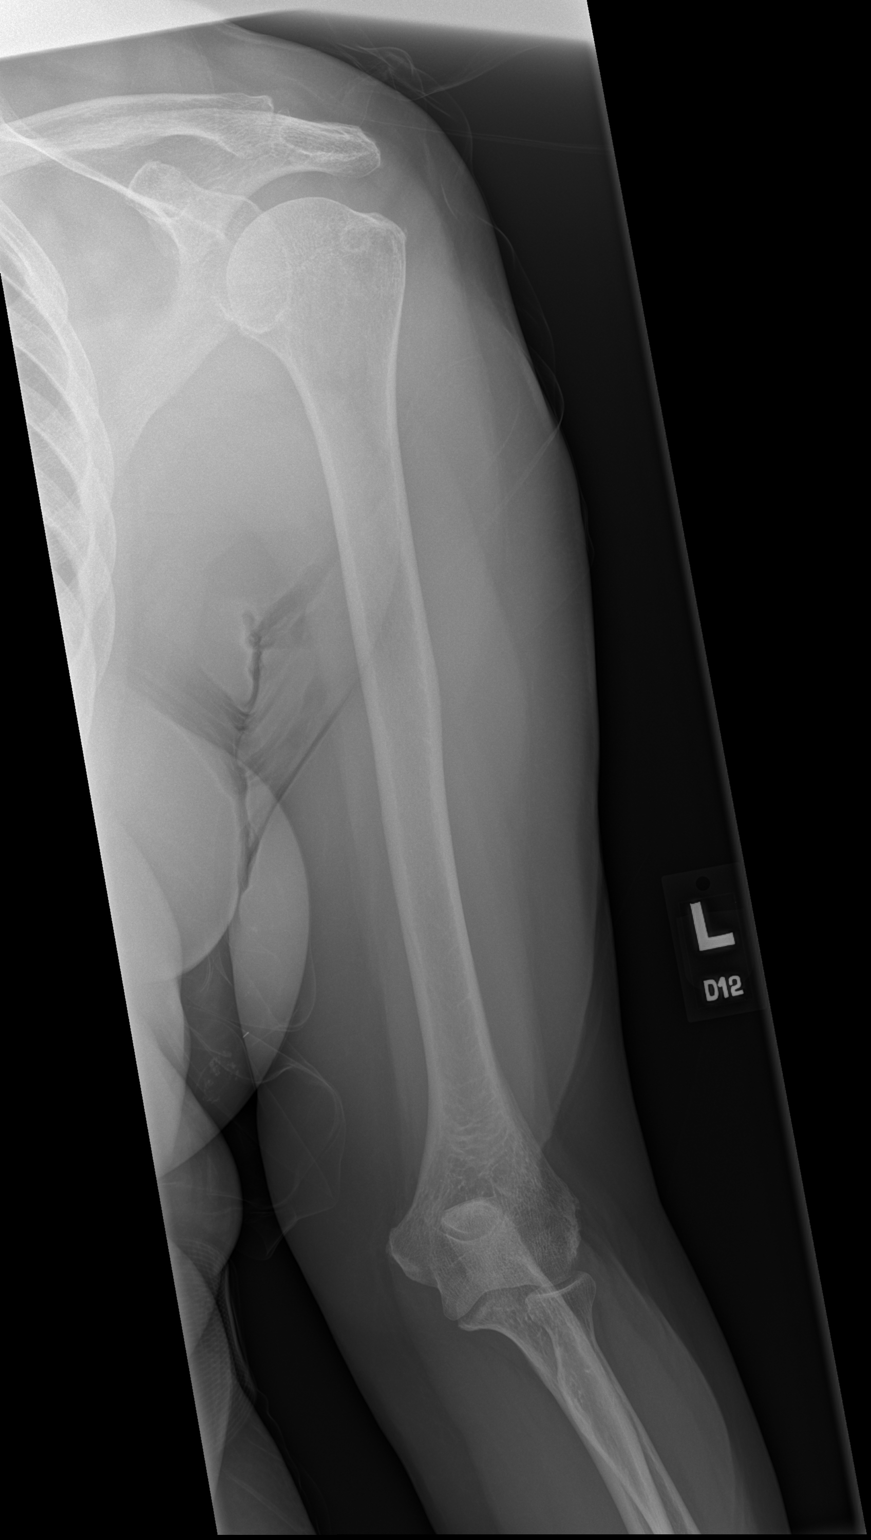

[x humerus lat left]
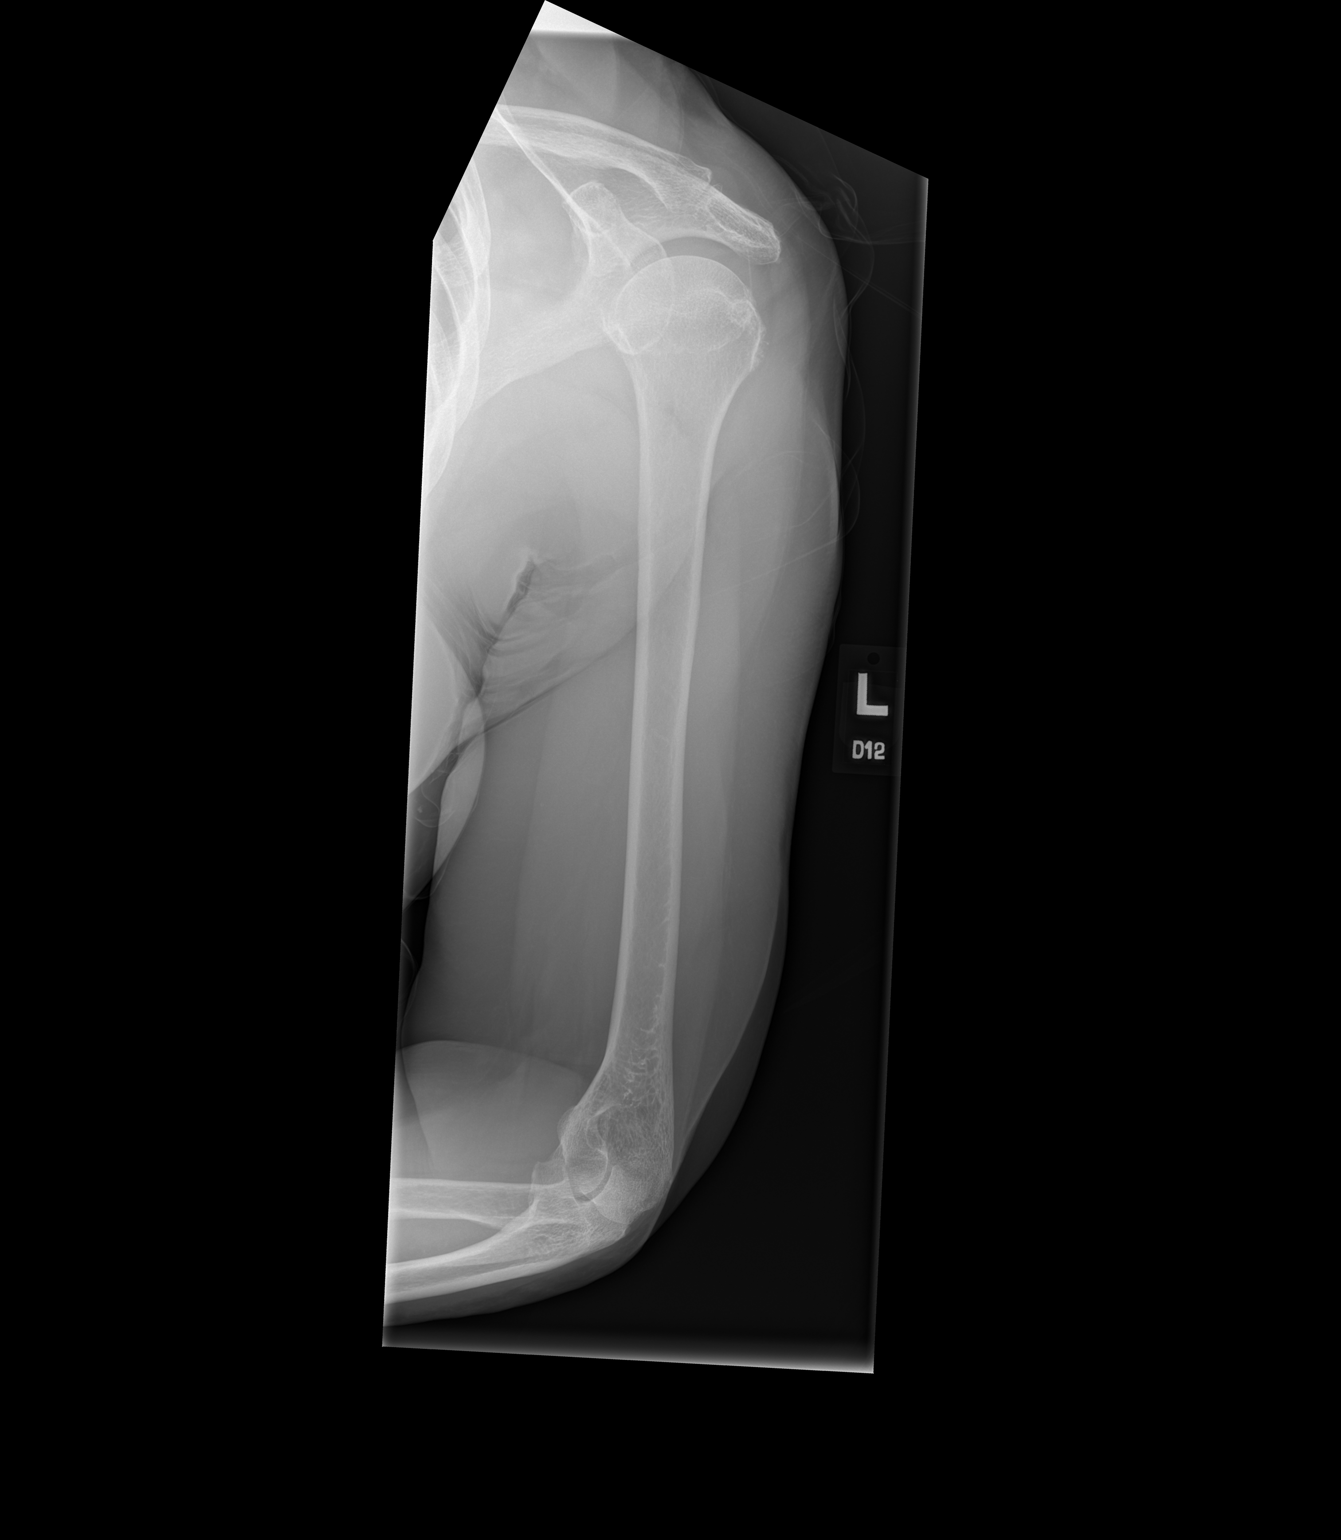

[2 of 2 positions shown; findings below may reference images not displayed]

FINDINGS: Frontal and lateral views were obtained. No fracture or dislocation.
Joint spaces appear normal. No erosive change.
IMPRESSION: No fracture or dislocation.  No evident arthropathy.

## 2021-03-10 MED ORDER — DICLOFENAC SODIUM 1 % EX GEL: 4.0000 g | Freq: Four times a day (QID) | CUTANEOUS | 0 refills | Status: AC

## 2021-03-10 MED ORDER — ACETAMINOPHEN 500 MG PO TABS: 1000.0000 mg | ORAL_TABLET | Freq: Once | ORAL | Status: DC

## 2021-03-10 NOTE — ED Notes (Signed)
PTAR called for transfer back to facility

## 2021-03-10 NOTE — ED Triage Notes (Signed)
Emergency Medicine Provider Triage Evaluation Note  Holly Valdez , a 85 y.o. female  was evaluated in triage.  Pt complains of fall, hit her left elbow and left flank. Did not hit her head. Pt states she was reaching for a wheelchair then tripped.Mainly complaining of forearm pain on L side.   Review of Systems  Positive: Forearm and elbow pain on L side  Negative: Head pain  Physical Exam  There were no vitals taken for this visit. Gen:   Awake, no distress   HEENT:  Atraumatic  Resp:  Normal effort  Cardiac:  Normal rate  Abd:   Nondistended, nontender MSK:   Tenderness to L forearm  Neuro:  Speech clear   Medical Decision Making  Medically screening exam initiated at 1:25 PM.  Appropriate orders placed.  Holly Valdez was informed that the remainder of the evaluation will be completed by another provider, this initial triage assessment does not replace that evaluation, and the importance of remaining in the ED until their evaluation is complete.  Clinical Impression  Stable  MSE was initiated and I personally evaluated the patient and placed orders (if any) at  1:30 PM on March 10, 2021.  The patient appears stable so that the remainder of the MSE may be completed by another provider.    Holly Client, PA-C 03/10/21 1330

## 2021-03-10 NOTE — Progress Notes (Signed)
Orthopedic Tech Progress Note Patient Details:  Holly Valdez December 19, 1935 [id number]  Ortho Devices Type of Ortho Device: Arm sling Ortho Device/Splint Location: left Ortho Device/Splint Interventions: Application   Post Interventions Patient Tolerated: Well Instructions Provided: Care of device   [name] 03/10/2021, 3:53 PM

## 2021-03-10 NOTE — Discharge Instructions (Addendum)
There is no obvious break in the bones of your left arm.  There is a possibility that you have a ligamentous injury or a bruise.  Please take Tylenol around-the-clock.  Follow-up with your family doctor.  If this continues to bother you they may need to send you to the orthopedic doctor to evaluate the ligaments.  Use the gel as prescribed Also take tylenol '1000mg'$ (2 extra strength) four times a day.

## 2021-03-10 NOTE — ED Provider Notes (Signed)
[facility] DEPT Provider Note   CSN: [id number] Arrival date & time: 03/10/21  1317     History Chief Complaint  Patient presents with  . Fall    Holly Valdez is a 85 y.o. female.  85 yo F with chief complaints of a fall.  Patient unfortunately has been nonambulatory for months.  She tried to stand up from her wheelchair on her own and she fell and landed on her left arm.  She is complaining of pain mostly to the left shoulder.  She denies head injury denies loss of consciousness denies neck pain back pain chest pain or abdominal pain.  She denies lower extremity symptoms.  Otherwise feels like she is at her baseline.  Level 5 caveat dementia.   Fall       Past Medical History:  Diagnosis Date  . Dementia ([place])   . Diabetes mellitus without complication ([place])   . Hypertension     Patient Active Problem List   Diagnosis Date Noted  . Hypovolemia 10/11/2020  . Uncontrolled type 2 diabetes mellitus with hyperglycemia ([place]) 10/11/2020  . Acute kidney injury ([place]) 10/11/2020  . CKD (chronic kidney disease) stage 3, GFR 30-59 ml/min ([facility]) 10/11/2020  . Dementia ([place]) 10/11/2020  . UTI (urinary tract infection) 10/10/2020    History reviewed. No pertinent surgical history.   OB History   No obstetric history on file.     No family history on file.  Social History   Tobacco Use  . Smoking status: Former Research scientist (life sciences)  . Smokeless tobacco: Never Used  Substance Use Topics  . Alcohol use: Never  . Drug use: Never    Home Medications Prior to Admission medications   Medication Sig Start Date End Date Taking? Authorizing Provider  diclofenac Sodium (VOLTAREN) 1 % GEL Apply 4 g topically 4 (four) times daily. 03/10/21  Yes Deno Etienne, DO  atorvastatin (LIPITOR) 40 MG tablet Take 40 mg by mouth every evening. 07/28/16   [provider]  calcium carbonate (OSCAL) 1500 (600 Ca) MG TABS tablet Take 1,500 mg by mouth 2 (two) times  daily.    [provider]  Cholecalciferol 50 MCG (2000 UT) CAPS Take 1 capsule by mouth daily.    [provider]  Continuous Blood Gluc Receiver (FREESTYLE LIBRE 2 READER) DEVI 1 each by Does not apply route.    [provider]  Continuous Blood Gluc Sensor (FREESTYLE LIBRE 2 SENSOR) MISC 1 each by Does not apply route.    [provider]  Docusate Sodium (DSS) 100 MG CAPS Take 100 mg by mouth daily as needed (constipation). 06/03/20   [provider]  DULoxetine (CYMBALTA) 60 MG capsule Take 1 capsule by mouth daily. 09/15/20   [provider]  hydrALAZINE (APRESOLINE) 25 MG tablet Take 25 mg by mouth 3 (three) times daily. 01/10/19   [provider]  insulin glargine (LANTUS) 100 UNIT/ML Solostar Pen Inject 15 Units into the skin daily. 10/15/20 11/14/20  Iona Beard, MD  Insulin Pen Needle (INSUPEN PEN NEEDLES) 32G X 4 MM MISC 1 Units by Does not apply route daily. 10/15/20 10/15/21  Iona Beard, MD  losartan (COZAAR) 100 MG tablet Take 100 mg by mouth daily. 02/13/18   [provider]    Allergies    Penicillins  Review of Systems   Review of Systems  Unable to perform ROS: Dementia    Physical Exam Updated Vital Signs BP (!) 151/86 (BP Location: Left Arm)  Pulse 68   Temp 98.3 F (36.8 C) (Oral)   Resp 18   SpO2 99%   Physical Exam Vitals and nursing note reviewed.  Constitutional:      General: She is not in acute distress.    Appearance: She is well-developed. She is not diaphoretic.  HENT:     Head: Normocephalic and atraumatic.  Eyes:     Pupils: Pupils are equal, round, and reactive to light.  Cardiovascular:     Rate and Rhythm: Normal rate and regular rhythm.     Heart sounds: No murmur heard. No friction rub. No gallop.   Pulmonary:     Effort: Pulmonary effort is normal.     Breath sounds: No wheezing or rales.  Abdominal:     General: There is no distension.     Palpations: Abdomen is  soft.     Tenderness: There is no abdominal tenderness.  Musculoskeletal:        General: Tenderness present.     Cervical back: Normal range of motion and neck supple.     Comments: Pain to the proximal humerus on the left.  No obvious pain at the elbow or the wrist.  Pulse motor and sensation intact distally.  No midline C-spine tenderness.  Able to rotate her head 45 degrees in either direction without pain.  On palpation from head to toe the patient has focal tenderness to the right tib-fib area.  No obvious edema no break in the skin.  Skin:    General: Skin is warm and dry.  Neurological:     Mental Status: She is alert and oriented to person, place, and time.  Psychiatric:        Behavior: Behavior normal.     ED Results / Procedures / Treatments   Labs (all labs ordered are listed, but only abnormal results are displayed) Labs Reviewed - No data to display  EKG None  Radiology DG Elbow Complete Left  Result Date: 03/10/2021 CLINICAL DATA:  Left elbow and forearm pain after a fall. Initial encounter. EXAM: LEFT ELBOW - COMPLETE 3+ VIEW COMPARISON:  None. FINDINGS: There is no evidence of fracture, dislocation, or joint effusion. There is no evidence of arthropathy or other focal bone abnormality. Soft tissues are unremarkable. IMPRESSION: Negative exam. Electronically Signed   By: Inge Rise M.D.   On: 03/10/2021 14:38   DG Forearm Left  Result Date: 03/10/2021 CLINICAL DATA:  Left elbow and forearm pain after a fall. Initial encounter. EXAM: LEFT FOREARM - 2 VIEW COMPARISON:  None. FINDINGS: There is no evidence of fracture or other focal bone lesions. Soft tissues are unremarkable. IMPRESSION: Negative exam. Electronically Signed   By: Inge Rise M.D.   On: 03/10/2021 14:38   DG Tibia/Fibula Right  Result Date: 03/10/2021 CLINICAL DATA:  Pain following fall EXAM: RIGHT TIBIA AND FIBULA - 2 VIEW COMPARISON:  None. FINDINGS: Frontal and lateral views were  obtained. No fracture or dislocation. Joint spaces appear unremarkable. There is a small posterior calcaneal spur. There are foci of arterial vascular calcification. IMPRESSION: No fracture or dislocation. No appreciable joint space narrowing. Small posterior calcaneal spur. Foci of arterial vascular atherosclerotic calcification. Electronically Signed   By: Lowella Grip III M.D.   On: 03/10/2021 15:11   DG Humerus Left  Result Date: 03/10/2021 CLINICAL DATA:  Pain following fall EXAM: LEFT HUMERUS - 2+ VIEW COMPARISON:  None. FINDINGS: Frontal and lateral views were obtained. No fracture or dislocation. Joint spaces appear  normal. No erosive change. IMPRESSION: No fracture or dislocation.  No evident arthropathy. Electronically Signed   By: Lowella Grip III M.D.   On: 03/10/2021 15:10    Procedures Procedures   Medications Ordered in ED Medications  acetaminophen (TYLENOL) tablet 1,000 mg (has no administration in time range)    ED Course  I have reviewed the triage vital signs and the nursing notes.  Pertinent labs & imaging results that were available during my care of the patient were reviewed by me and considered in my medical decision making (see chart for details).    MDM Rules/Calculators/A&P                          85 yo F with a chief complaints of a fall.  Nonsyncopal by history.  Complaining of left shoulder pain.  Plain films ordered in quick look process of the elbow and forearm negative for fracture is viewed by me.  Patient has more pain on my exam of the proximal humerus.  Will obtain a humerus film.  She also incidentally has pain in the right tib-fib that she did notice.  Will obtain plain film.  Plain film viewed by me negative for fracture.  Will discharge the patient home.  PCP follow-up.  3:19 PM:  I have discussed the diagnosis/risks/treatment options with the patient and family and believe the pt to be eligible for discharge home to follow-up with PCP.  We also discussed returning to the ED immediately if new or worsening sx occur. We discussed the sx which are most concerning (e.g., sudden worsening pain, fever, inability to tolerate by mouth) that necessitate immediate return. Medications administered to the patient during their visit and any new prescriptions provided to the patient are listed below.  Medications given during this visit Medications  acetaminophen (TYLENOL) tablet 1,000 mg (has no administration in time range)     The patient appears reasonably screen and/or stabilized for discharge and I doubt any other medical condition or other [facility] requiring further screening, evaluation, or treatment in the ED at this time prior to discharge.   Final Clinical Impression(s) / ED Diagnoses Final diagnoses:  Left arm pain    Rx / DC Orders ED Discharge Orders         Ordered    diclofenac Sodium (VOLTAREN) 1 % GEL  4 times daily        03/10/21 [place], Sebrena Engh, DO 03/10/21 1519

## 2021-03-10 NOTE — ED Notes (Signed)
Pt leaving with [facility] 

## 2021-03-10 NOTE — ED Triage Notes (Signed)
Per EMS-from [facility], unwitnessed fall-left elbow and left flank pain-did not hit head, no blood thinners

## 2021-12-14 IMAGING — CR DG FOREARM 2V*L*
2 series · 2 of 2 positions shown · non-contrast
Comparison: None.

CLINICAL DATA: Left elbow and forearm pain after a fall. Initial
encounter.

EXAM:
LEFT FOREARM - 2 VIEW

[x forearm lat left]
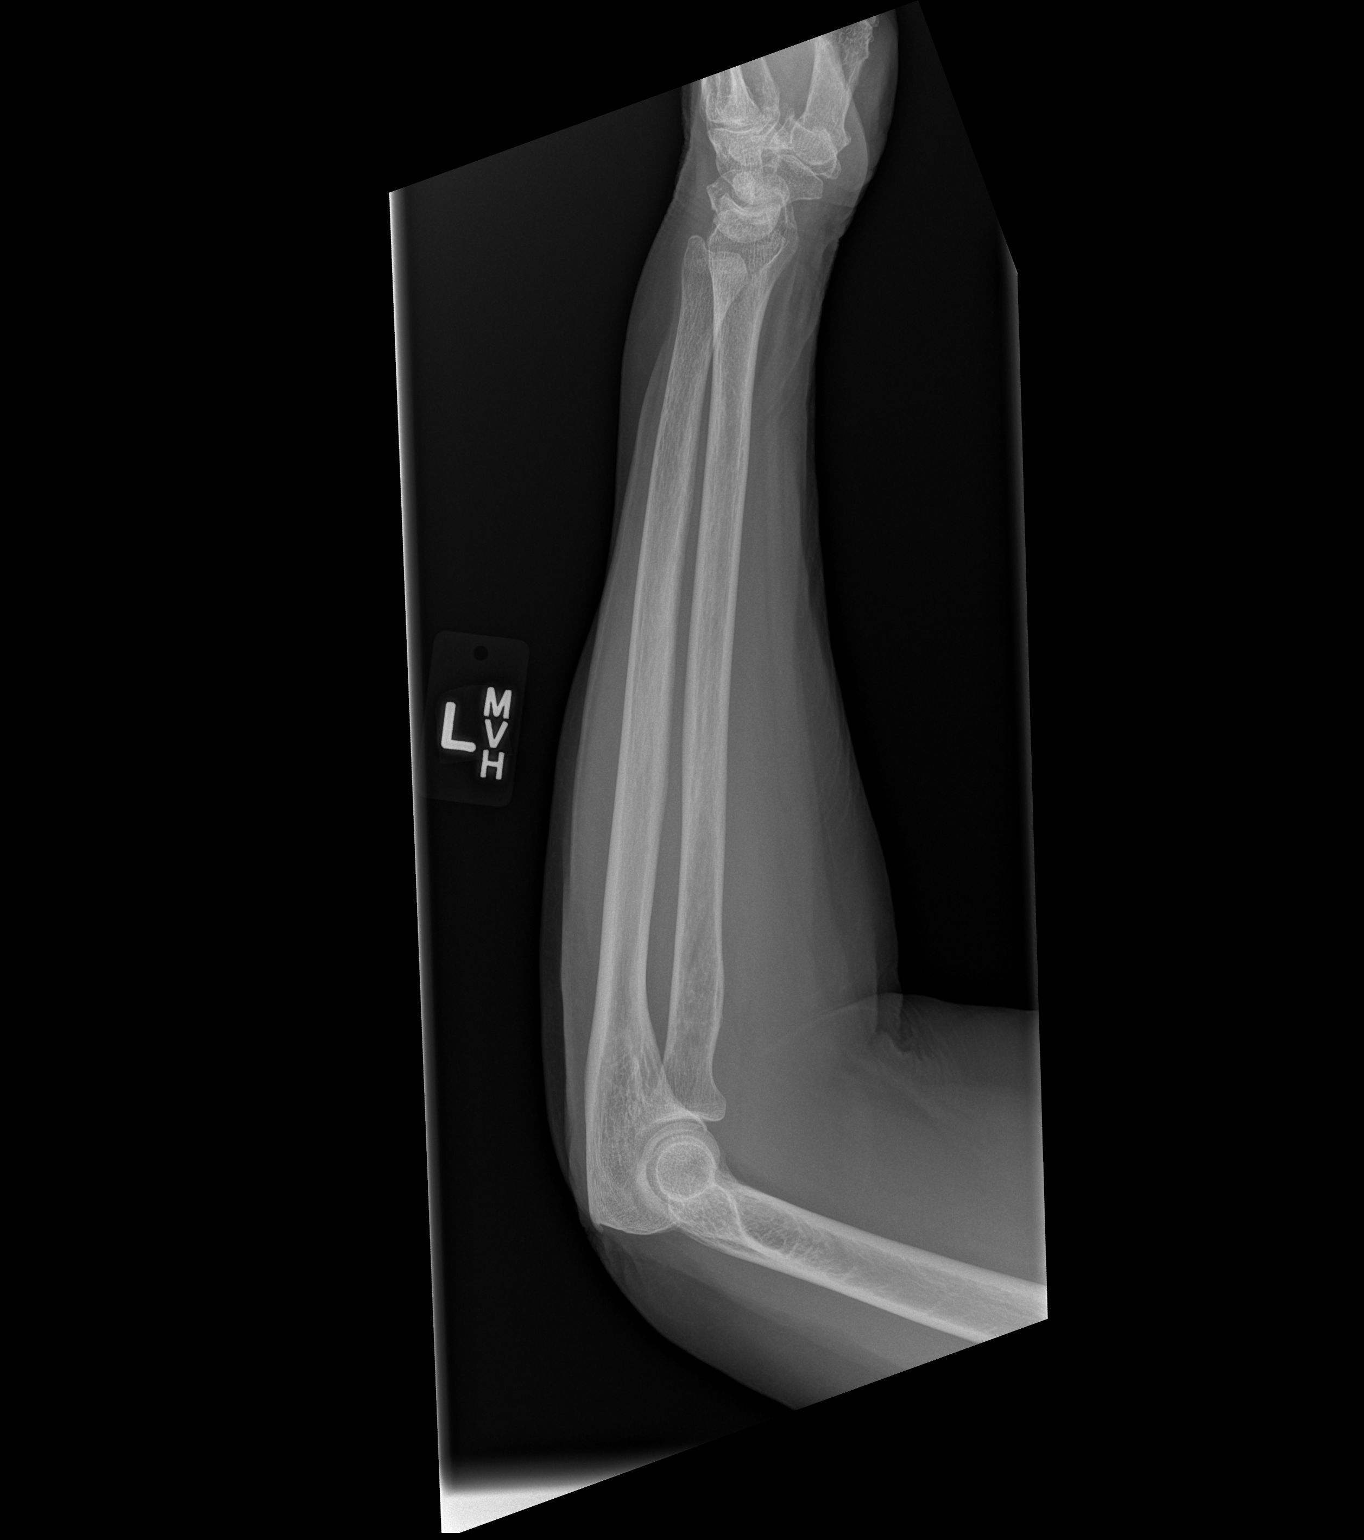

[x forearm ap left]
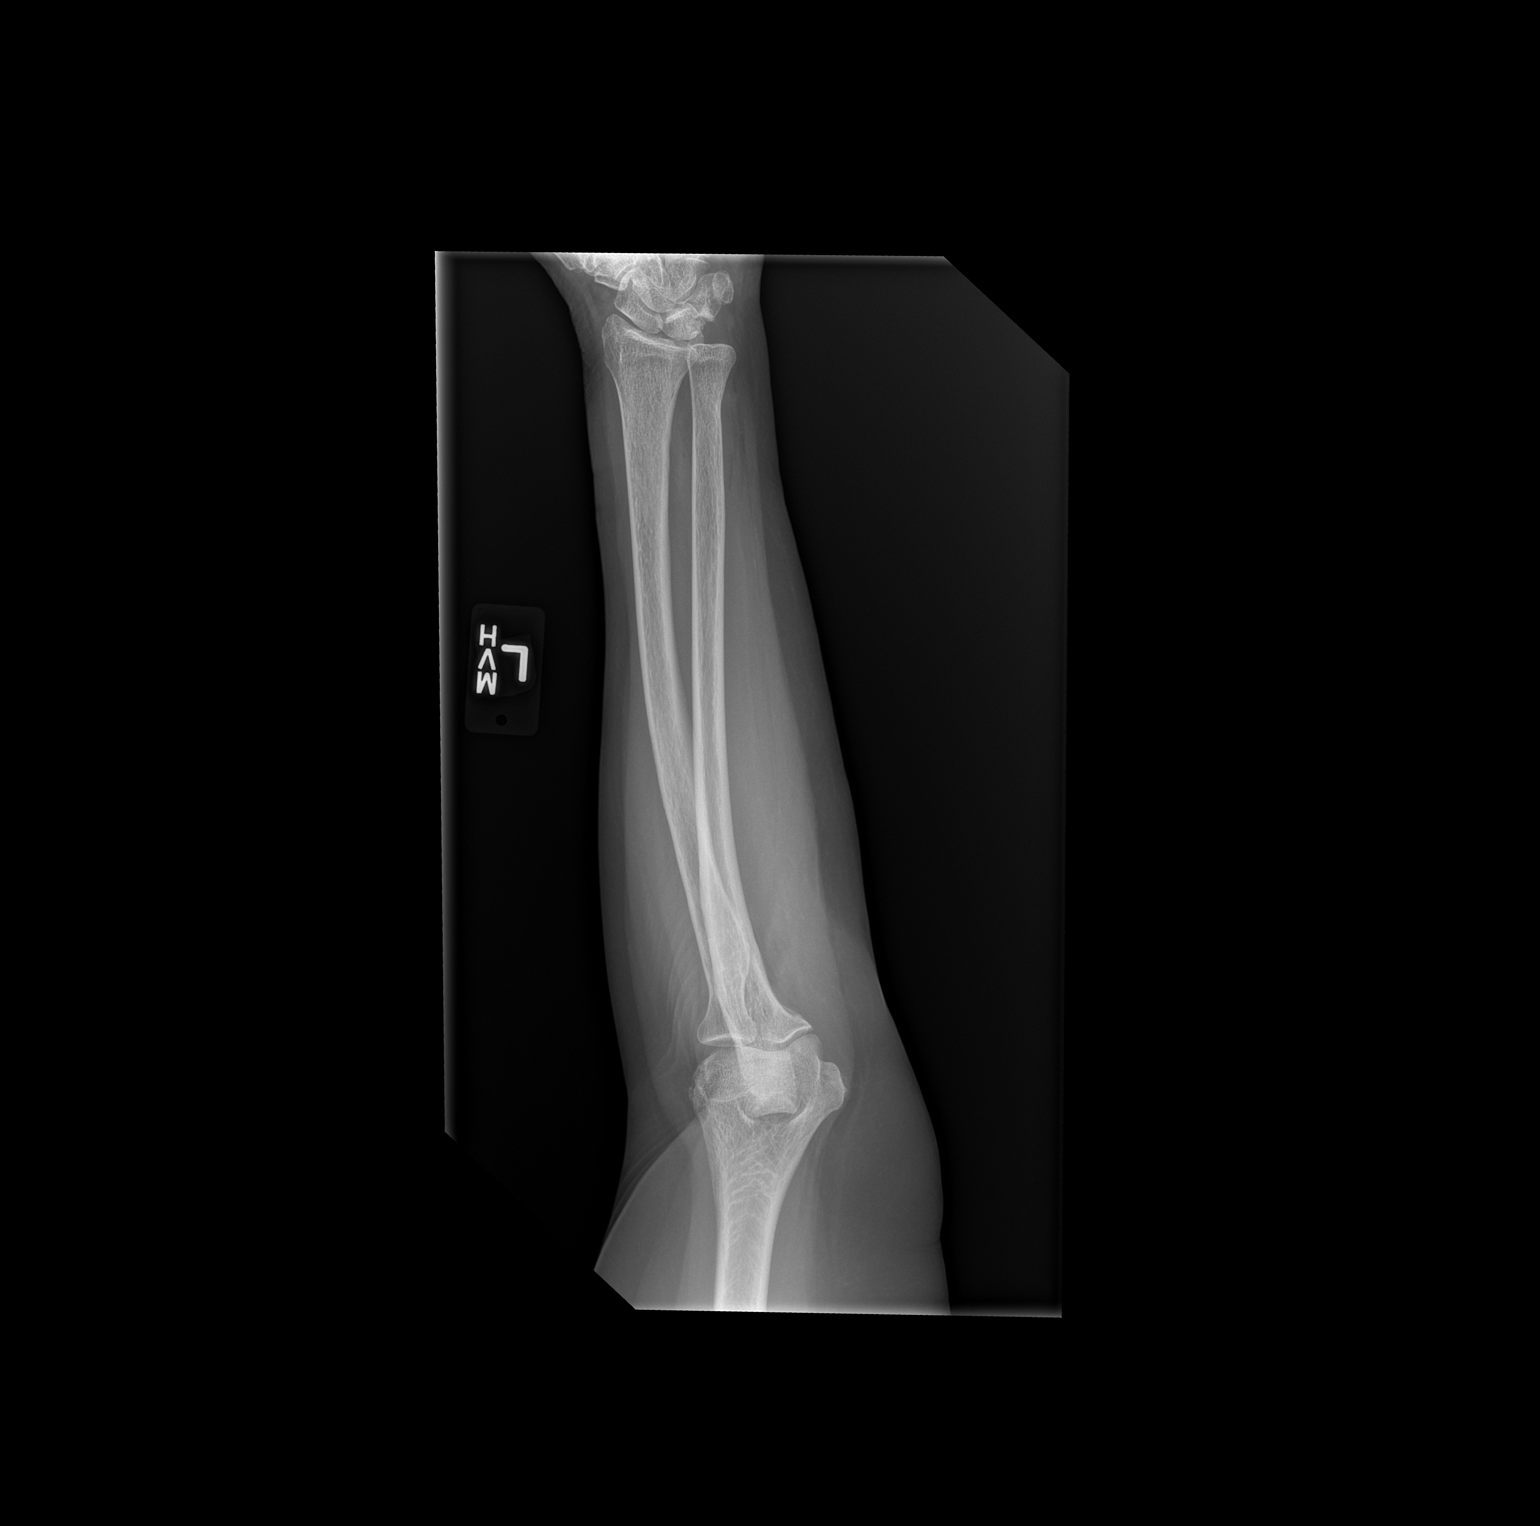

[2 of 2 positions shown; findings below may reference images not displayed]

FINDINGS: There is no evidence of fracture or other focal bone lesions. Soft
tissues are unremarkable.
IMPRESSION: Negative exam.

## 2022-04-05 ENCOUNTER — Emergency Department (HOSPITAL_COMMUNITY): Payer: Medicare Other

## 2022-04-05 ENCOUNTER — Inpatient Hospital Stay (HOSPITAL_COMMUNITY): Payer: Medicare Other

## 2022-04-05 ENCOUNTER — Inpatient Hospital Stay (HOSPITAL_COMMUNITY)
Admission: EM | Admit: 2022-04-05 | Discharge: 2022-04-08 | DRG: 689 | Disposition: A | Discharge status: Skilled Nursing Facility | Payer: Medicare Other | Source: Skilled Nursing Facility | Attending: Internal Medicine | Admitting: Internal Medicine

## 2022-04-05 ENCOUNTER — Encounter (HOSPITAL_COMMUNITY): Payer: Self-pay | Admitting: *Deleted

## 2022-04-05 ENCOUNTER — Other Ambulatory Visit: Payer: Self-pay

## 2022-04-05 ENCOUNTER — Ambulatory Visit (HOSPITAL_COMMUNITY): Payer: Medicare Other

## 2022-04-05 DIAGNOSIS — Z993 Dependence on wheelchair: Secondary | ICD-10-CM

## 2022-04-05 DIAGNOSIS — R402432 Glasgow coma scale score 3-8, at arrival to emergency department: Secondary | ICD-10-CM

## 2022-04-05 DIAGNOSIS — N189 Chronic kidney disease, unspecified: Secondary | ICD-10-CM | POA: Diagnosis present

## 2022-04-05 DIAGNOSIS — N179 Acute kidney failure, unspecified: Secondary | ICD-10-CM | POA: Diagnosis present

## 2022-04-05 DIAGNOSIS — I959 Hypotension, unspecified: Secondary | ICD-10-CM | POA: Diagnosis present

## 2022-04-05 DIAGNOSIS — D631 Anemia in chronic kidney disease: Secondary | ICD-10-CM | POA: Diagnosis present

## 2022-04-05 DIAGNOSIS — Z87891 Personal history of nicotine dependence: Secondary | ICD-10-CM

## 2022-04-05 DIAGNOSIS — E1140 Type 2 diabetes mellitus with diabetic neuropathy, unspecified: Secondary | ICD-10-CM | POA: Diagnosis present

## 2022-04-05 DIAGNOSIS — E872 Acidosis, unspecified: Secondary | ICD-10-CM | POA: Diagnosis present

## 2022-04-05 DIAGNOSIS — M4800 Spinal stenosis, site unspecified: Secondary | ICD-10-CM | POA: Diagnosis present

## 2022-04-05 DIAGNOSIS — E785 Hyperlipidemia, unspecified: Secondary | ICD-10-CM | POA: Diagnosis present

## 2022-04-05 DIAGNOSIS — G931 Anoxic brain damage, not elsewhere classified: Secondary | ICD-10-CM | POA: Diagnosis not present

## 2022-04-05 DIAGNOSIS — I129 Hypertensive chronic kidney disease with stage 1 through stage 4 chronic kidney disease, or unspecified chronic kidney disease: Secondary | ICD-10-CM | POA: Diagnosis present

## 2022-04-05 DIAGNOSIS — R410 Disorientation, unspecified: Secondary | ICD-10-CM | POA: Diagnosis not present

## 2022-04-05 DIAGNOSIS — N1832 Chronic kidney disease, stage 3b: Secondary | ICD-10-CM | POA: Diagnosis present

## 2022-04-05 DIAGNOSIS — R7989 Other specified abnormal findings of blood chemistry: Secondary | ICD-10-CM | POA: Diagnosis present

## 2022-04-05 DIAGNOSIS — M545 Low back pain, unspecified: Secondary | ICD-10-CM | POA: Diagnosis present

## 2022-04-05 DIAGNOSIS — E860 Dehydration: Secondary | ICD-10-CM | POA: Diagnosis present

## 2022-04-05 DIAGNOSIS — Z7984 Long term (current) use of oral hypoglycemic drugs: Secondary | ICD-10-CM

## 2022-04-05 DIAGNOSIS — Z794 Long term (current) use of insulin: Secondary | ICD-10-CM | POA: Diagnosis not present

## 2022-04-05 DIAGNOSIS — E0842 Diabetes mellitus due to underlying condition with diabetic polyneuropathy: Secondary | ICD-10-CM

## 2022-04-05 DIAGNOSIS — Z7189 Other specified counseling: Secondary | ICD-10-CM | POA: Diagnosis not present

## 2022-04-05 DIAGNOSIS — G8929 Other chronic pain: Secondary | ICD-10-CM | POA: Diagnosis present

## 2022-04-05 DIAGNOSIS — R4182 Altered mental status, unspecified: Secondary | ICD-10-CM

## 2022-04-05 DIAGNOSIS — E1122 Type 2 diabetes mellitus with diabetic chronic kidney disease: Secondary | ICD-10-CM

## 2022-04-05 DIAGNOSIS — F03918 Unspecified dementia, unspecified severity, with other behavioral disturbance: Secondary | ICD-10-CM | POA: Diagnosis present

## 2022-04-05 DIAGNOSIS — Z7401 Bed confinement status: Secondary | ICD-10-CM

## 2022-04-05 DIAGNOSIS — Z66 Do not resuscitate: Secondary | ICD-10-CM | POA: Diagnosis present

## 2022-04-05 DIAGNOSIS — E1165 Type 2 diabetes mellitus with hyperglycemia: Secondary | ICD-10-CM | POA: Diagnosis present

## 2022-04-05 DIAGNOSIS — R778 Other specified abnormalities of plasma proteins: Secondary | ICD-10-CM | POA: Diagnosis not present

## 2022-04-05 DIAGNOSIS — N390 Urinary tract infection, site not specified: Principal | ICD-10-CM | POA: Diagnosis present

## 2022-04-05 DIAGNOSIS — F0390 Unspecified dementia without behavioral disturbance: Secondary | ICD-10-CM | POA: Diagnosis not present

## 2022-04-05 DIAGNOSIS — G9341 Metabolic encephalopathy: Secondary | ICD-10-CM | POA: Diagnosis present

## 2022-04-05 DIAGNOSIS — Z79899 Other long term (current) drug therapy: Secondary | ICD-10-CM

## 2022-04-05 DIAGNOSIS — Z79890 Hormone replacement therapy (postmenopausal): Secondary | ICD-10-CM

## 2022-04-05 DIAGNOSIS — E11319 Type 2 diabetes mellitus with unspecified diabetic retinopathy without macular edema: Secondary | ICD-10-CM | POA: Diagnosis present

## 2022-04-05 DIAGNOSIS — Z880 Allergy status to penicillin: Secondary | ICD-10-CM

## 2022-04-05 LAB — I-STAT VENOUS BLOOD GAS, ED
Acid-base deficit: 2 mmol/L (ref 0.0–2.0)
Bicarbonate: 22.7 mmol/L (ref 20.0–28.0)
Calcium, Ion: 1.12 mmol/L — ABNORMAL LOW (ref 1.15–1.40)
HCT: 30 % — ABNORMAL LOW (ref 36.0–46.0)
Hemoglobin: 10.2 g/dL — ABNORMAL LOW (ref 12.0–15.0)
O2 Saturation: 92 %
Potassium: 4.2 mmol/L (ref 3.5–5.1)
Sodium: 136 mmol/L (ref 135–145)
TCO2: 24 mmol/L (ref 22–32)
pCO2, Ven: 37.9 mmHg — ABNORMAL LOW (ref 44–60)
pH, Ven: 7.386 (ref 7.25–7.43)
pO2, Ven: 65 mmHg — ABNORMAL HIGH (ref 32–45)

## 2022-04-05 LAB — I-STAT CHEM 8, ED
BUN: 30 mg/dL — ABNORMAL HIGH (ref 8–23)
Calcium, Ion: 1.11 mmol/L — ABNORMAL LOW (ref 1.15–1.40)
Chloride: 103 mmol/L (ref 98–111)
Creatinine, Ser: 2.1 mg/dL — ABNORMAL HIGH (ref 0.44–1.00)
Glucose, Bld: 254 mg/dL — ABNORMAL HIGH (ref 70–99)
HCT: 30 % — ABNORMAL LOW (ref 36.0–46.0)
Hemoglobin: 10.2 g/dL — ABNORMAL LOW (ref 12.0–15.0)
Potassium: 4.2 mmol/L (ref 3.5–5.1)
Sodium: 135 mmol/L (ref 135–145)
TCO2: 22 mmol/L (ref 22–32)

## 2022-04-05 LAB — CBC WITH DIFFERENTIAL/PLATELET
Abs Immature Granulocytes: 0.07 10*3/uL (ref 0.00–0.07)
Basophils Absolute: 0 10*3/uL (ref 0.0–0.1)
Basophils Relative: 0 %
Eosinophils Absolute: 0.1 10*3/uL (ref 0.0–0.5)
Eosinophils Relative: 1 %
HCT: 30.6 % — ABNORMAL LOW (ref 36.0–46.0)
Hemoglobin: 9.7 g/dL — ABNORMAL LOW (ref 12.0–15.0)
Immature Granulocytes: 1 %
Lymphocytes Relative: 20 %
Lymphs Abs: 1.5 10*3/uL (ref 0.7–4.0)
MCH: 28.4 pg (ref 26.0–34.0)
MCHC: 31.7 g/dL (ref 30.0–36.0)
MCV: 89.5 fL (ref 80.0–100.0)
Monocytes Absolute: 0.6 10*3/uL (ref 0.1–1.0)
Monocytes Relative: 7 %
Neutro Abs: 5.4 10*3/uL (ref 1.7–7.7)
Neutrophils Relative %: 71 %
Platelets: 225 10*3/uL (ref 150–400)
RBC: 3.42 MIL/uL — ABNORMAL LOW (ref 3.87–5.11)
RDW: 14.3 % (ref 11.5–15.5)
WBC: 7.6 10*3/uL (ref 4.0–10.5)
nRBC: 0 % (ref 0.0–0.2)

## 2022-04-05 LAB — COMPREHENSIVE METABOLIC PANEL
ALT: 17 U/L (ref 0–44)
AST: 22 U/L (ref 15–41)
Albumin: 3 g/dL — ABNORMAL LOW (ref 3.5–5.0)
Alkaline Phosphatase: 56 U/L (ref 38–126)
Anion gap: 10 (ref 5–15)
BUN: 31 mg/dL — ABNORMAL HIGH (ref 8–23)
CO2: 19 mmol/L — ABNORMAL LOW (ref 22–32)
Calcium: 8.6 mg/dL — ABNORMAL LOW (ref 8.9–10.3)
Chloride: 106 mmol/L (ref 98–111)
Creatinine, Ser: 2.09 mg/dL — ABNORMAL HIGH (ref 0.44–1.00)
GFR, Estimated: 23 mL/min — ABNORMAL LOW (ref >60)
Glucose, Bld: 240 mg/dL — ABNORMAL HIGH (ref 70–99)
Potassium: 4.2 mmol/L (ref 3.5–5.1)
Sodium: 135 mmol/L (ref 135–145)
Total Bilirubin: 0.4 mg/dL (ref 0.3–1.2)
Total Protein: 5.9 g/dL — ABNORMAL LOW (ref 6.5–8.1)

## 2022-04-05 LAB — RAPID URINE DRUG SCREEN, HOSP PERFORMED
Amphetamines: NOT DETECTED
Barbiturates: NOT DETECTED
Benzodiazepines: NOT DETECTED
Cocaine: NOT DETECTED
Opiates: NOT DETECTED
Tetrahydrocannabinol: NOT DETECTED

## 2022-04-05 LAB — LACTIC ACID, PLASMA
Lactic Acid, Venous: 2.5 mmol/L (ref 0.5–1.9)
Lactic Acid, Venous: 2.5 mmol/L (ref 0.5–1.9)
Lactic Acid, Venous: 3.4 mmol/L (ref 0.5–1.9)

## 2022-04-05 LAB — AMMONIA: Ammonia: 11 umol/L (ref 9–35)

## 2022-04-05 LAB — GLUCOSE, CAPILLARY
Glucose-Capillary: 196 mg/dL — ABNORMAL HIGH (ref 70–99)
Glucose-Capillary: 166 mg/dL — ABNORMAL HIGH (ref 70–99)
Glucose-Capillary: 85 mg/dL (ref 70–99)
Glucose-Capillary: 71 mg/dL (ref 70–99)

## 2022-04-05 LAB — URINALYSIS, ROUTINE W REFLEX MICROSCOPIC
Bilirubin Urine: NEGATIVE
Glucose, UA: NEGATIVE mg/dL
Hgb urine dipstick: NEGATIVE
Ketones, ur: NEGATIVE mg/dL
Nitrite: NEGATIVE
Protein, ur: 30 mg/dL — AB
Specific Gravity, Urine: 1.025 (ref 1.005–1.030)
pH: 6 (ref 5.0–8.0)

## 2022-04-05 LAB — COOXEMETRY PANEL
Carboxyhemoglobin: 1.5 % (ref 0.5–1.5)
Methemoglobin: 0.9 % (ref 0.0–1.5)
O2 Saturation: 98.8 %
Total hemoglobin: 10.3 g/dL — ABNORMAL LOW (ref 12.0–16.0)

## 2022-04-05 LAB — URINALYSIS, MICROSCOPIC (REFLEX)
RBC / HPF: NONE SEEN RBC/hpf (ref 0–5)
WBC, UA: >50 WBC/hpf (ref 0–5)

## 2022-04-05 LAB — TROPONIN I (HIGH SENSITIVITY)
Troponin I (High Sensitivity): 54 ng/L — ABNORMAL HIGH (ref <18)
Troponin I (High Sensitivity): 47 ng/L — ABNORMAL HIGH (ref <18)

## 2022-04-05 LAB — PROTIME-INR
INR: 1.1 (ref 0.8–1.2)
Prothrombin Time: 13.7 seconds (ref 11.4–15.2)

## 2022-04-05 LAB — FOLATE: Folate: 13 ng/mL (ref >5.9)

## 2022-04-05 LAB — VITAMIN B12: Vitamin B-12: 397 pg/mL (ref 180–914)

## 2022-04-05 LAB — SEDIMENTATION RATE: Sed Rate: 45 mm/hr — ABNORMAL HIGH (ref 0–22)

## 2022-04-05 LAB — TSH: TSH: 4.26 u[IU]/mL (ref 0.350–4.500)

## 2022-04-05 IMAGING — CT CT HEAD W/O CM
4 series · 16 of 47 positions shown, 18 images · non-contrast
Comparison: None Available.

CLINICAL DATA: Mental status change, unknown cause



[Series 3: head without · axial · non-contrast · 0.45mm/px · z∈[-180,-64]mm · 6 of 33 slices shown, 8 images]
[im 5/33  brain]
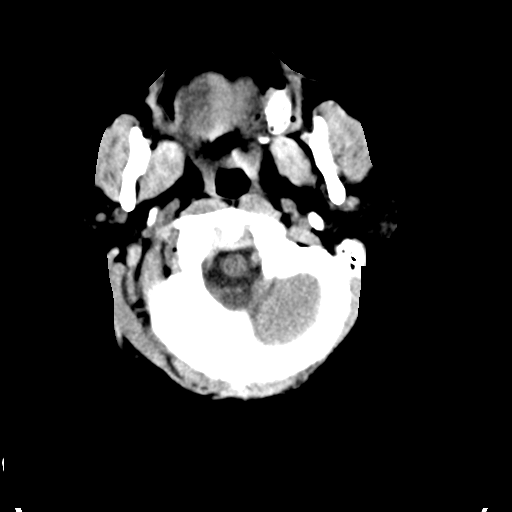
[im 5/33  bone]
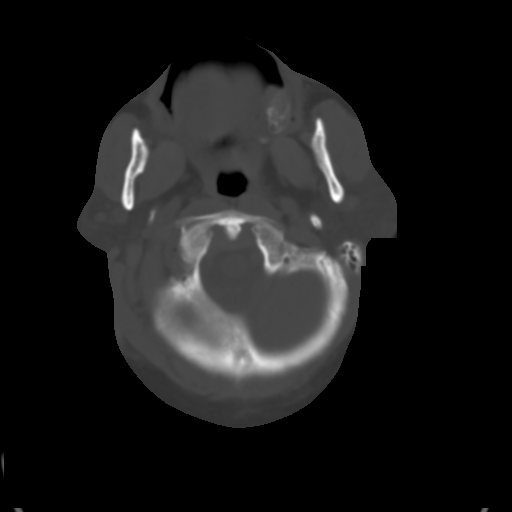
[im 10/33  brain]
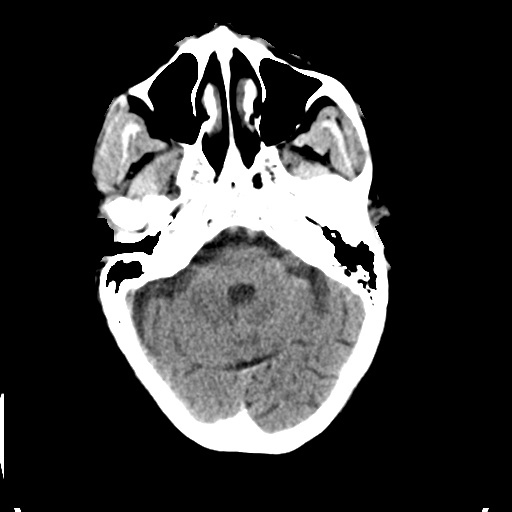
[im 14/33  brain]
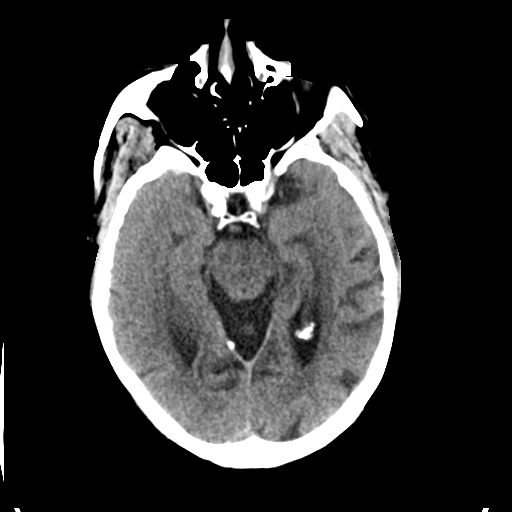
[im 19/33  brain]
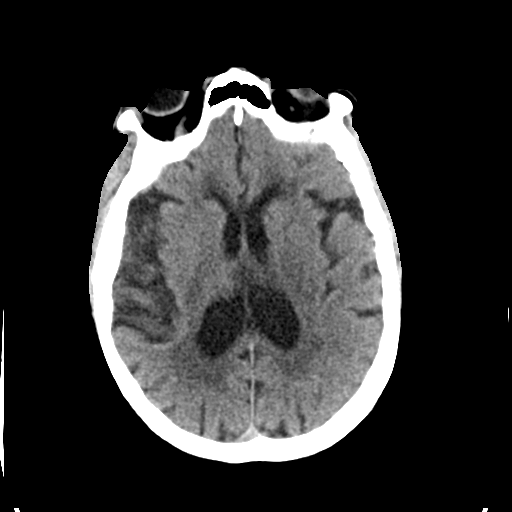
[im 23/33  brain]
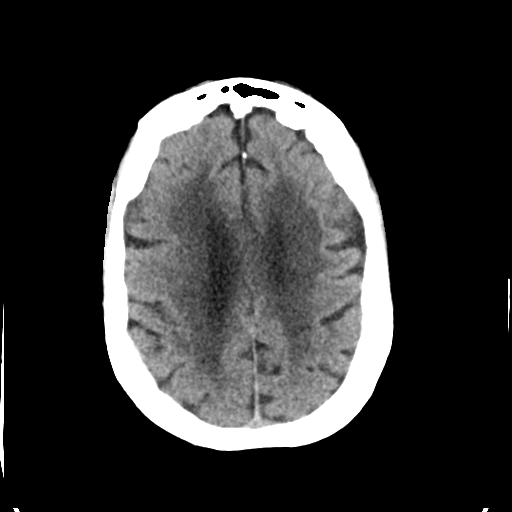
[im 23/33  bone]
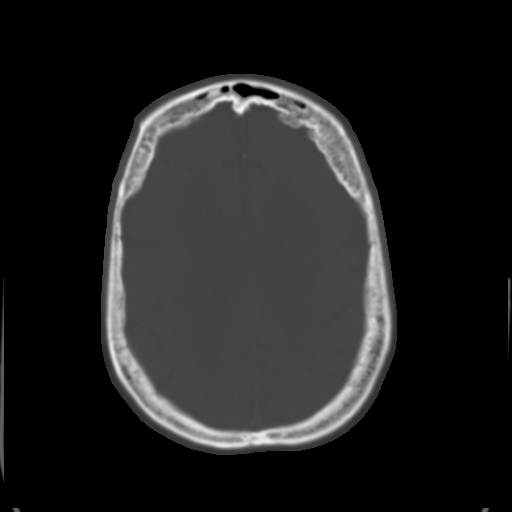
[im 28/33  brain]
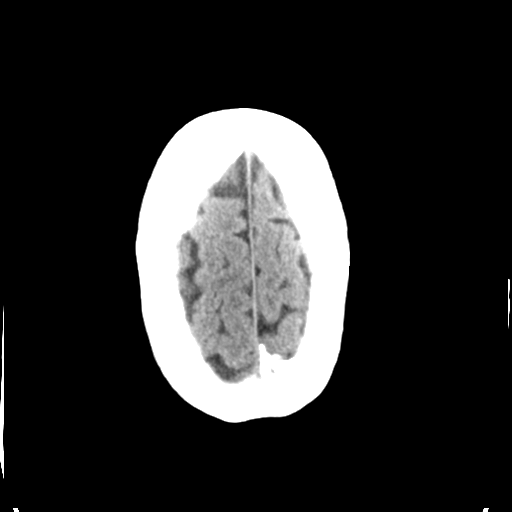

[Series 4: head bone · axial · 0.45mm/px · z∈[-184,-128]mm · 4 of 85 slices shown]
[im 9/85  bone]
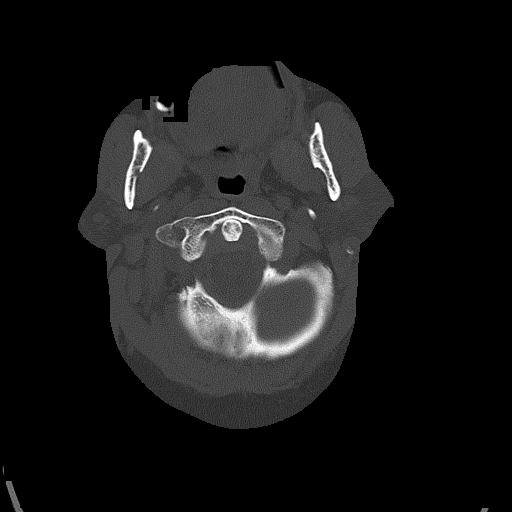
[im 17/85  bone]
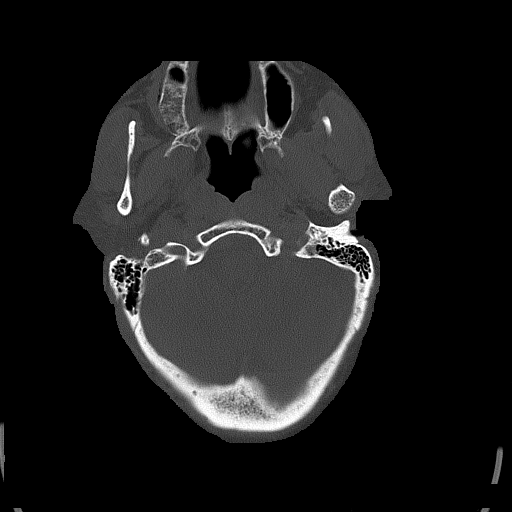
[im 29/85  bone]
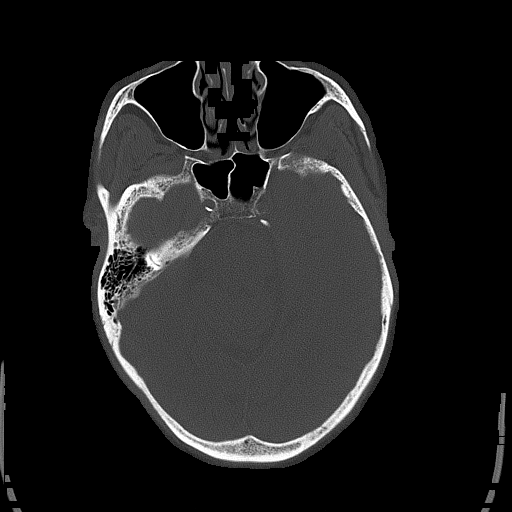
[im 37/85  bone]
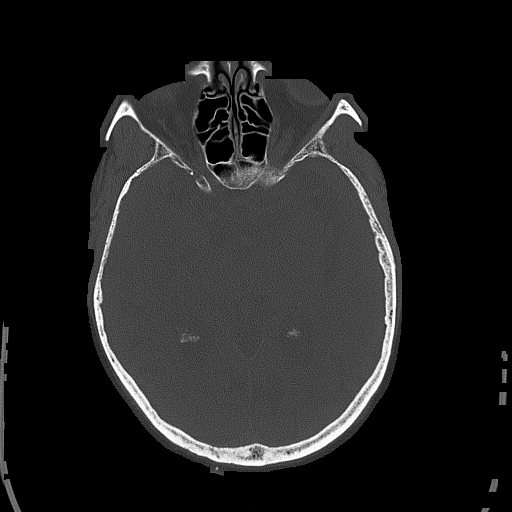

[Series 5: head without cor · coronal · non-contrast · 0.33mm/px · 3 of 67 slices shown]
[im 23/67  brain]
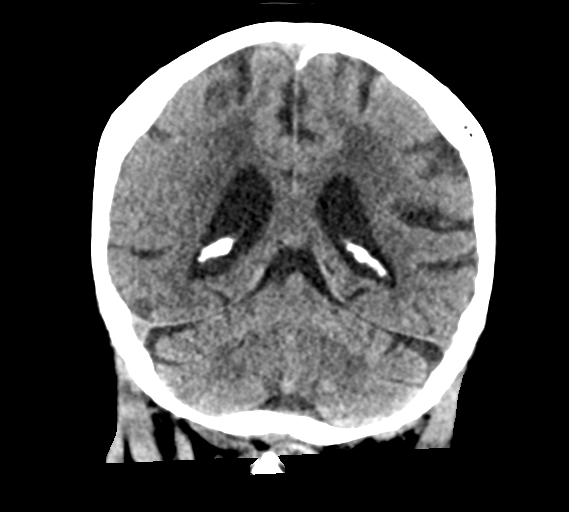
[im 30/67  brain]
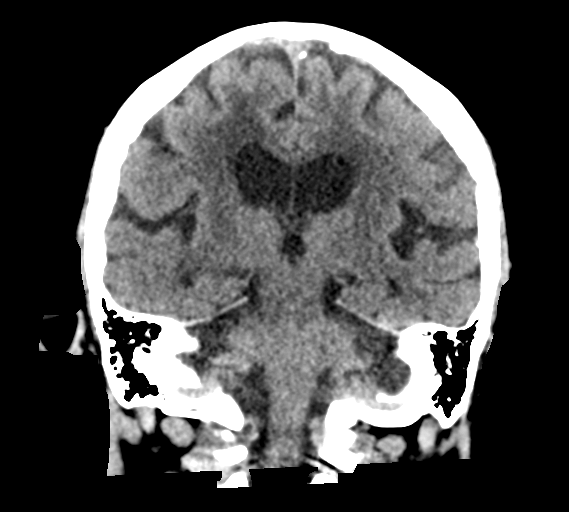
[im 37/67  brain]
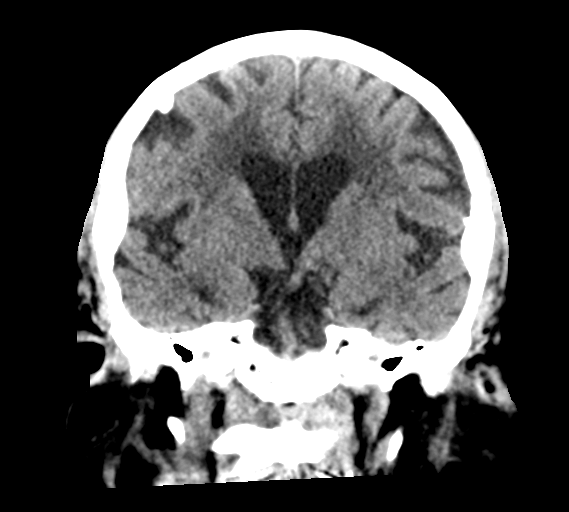

[Series 6: head without sag · sagittal · non-contrast · 0.33mm/px · 3 of 66 slices shown]
[im 22/66  brain]
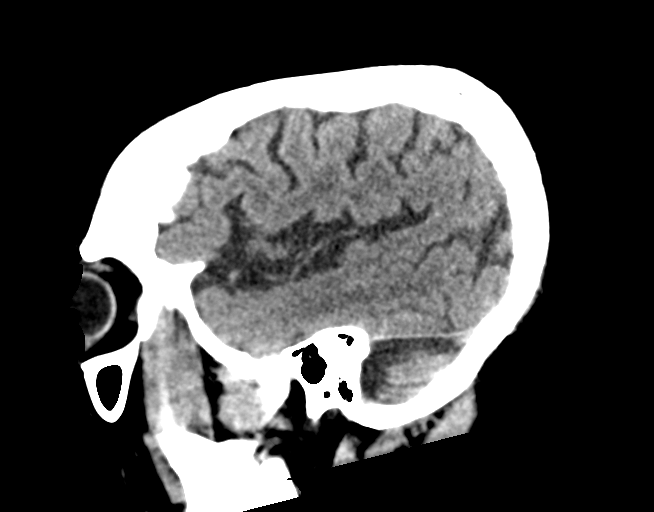
[im 33/66  brain]
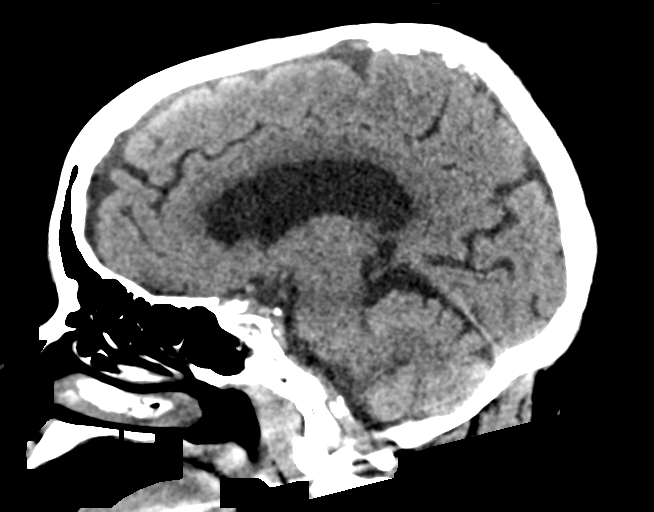
[im 44/66  brain]
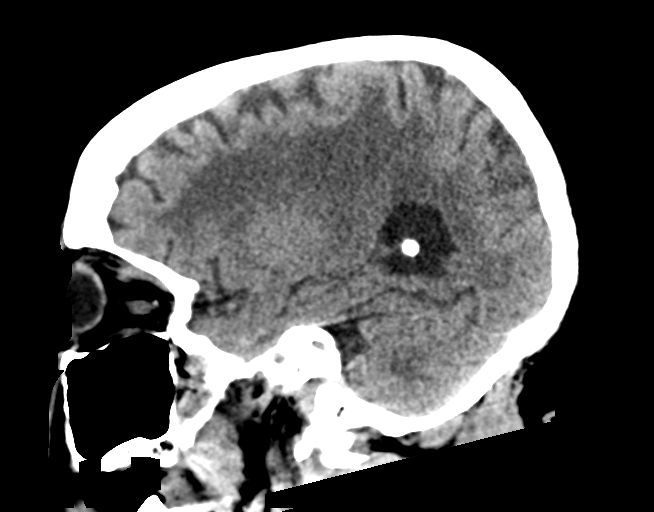

[16 of 47 positions shown; findings below may reference images not displayed]

FINDINGS: Brain: Normal anatomic configuration. Parenchymal volume loss is
commensurate with the patient's age. Moderate subcortical and
periventricular white matter changes are present likely reflecting
the sequela of small vessel ischemia. Tiny remote lacunar infarct
noted within the right cerebellar hemisphere. No abnormal intra or
extra-axial mass lesion or fluid collection. No abnormal mass effect
or midline shift. No evidence of acute intracranial hemorrhage or
infarct. Ventricular size is normal. Cerebellum unremarkable.

Vascular: No asymmetric hyperdense vasculature at the skull base.

Skull: Intact

Sinuses/Orbits: Paranasal sinuses are clear. Ocular lenses have been
removed. Orbits are otherwise unremarkable.

Other: Mastoid air cells and middle ear cavities are clear.
IMPRESSION: No acute intracranial abnormality.  No calvarial fracture.

## 2022-04-05 IMAGING — CT CT ABD-PELV W/O CM
2 of 4 series · 16 of 46 positions shown, 18 images · non-contrast
Comparison: None Available.

CLINICAL DATA: UTI, recurrent/complicated abdominal mass on
examination.



[Series 3: a/p w/o 5mm · axial · non-contrast · 0.98mm/px · z∈[+1122,+1522]mm · 13 of 88 slices shown, 15 images]
[im 4/88  soft-tissue]
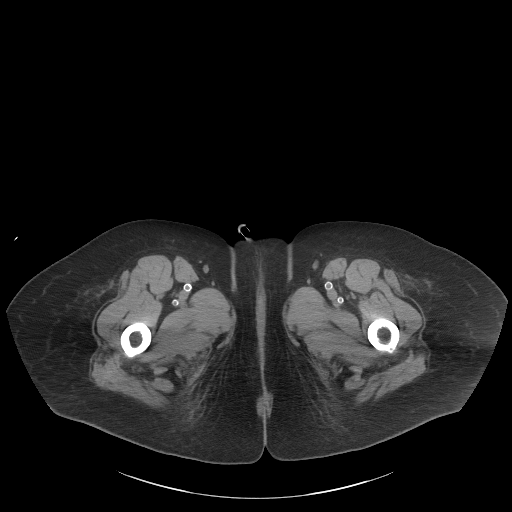
[im 4/88  bone]
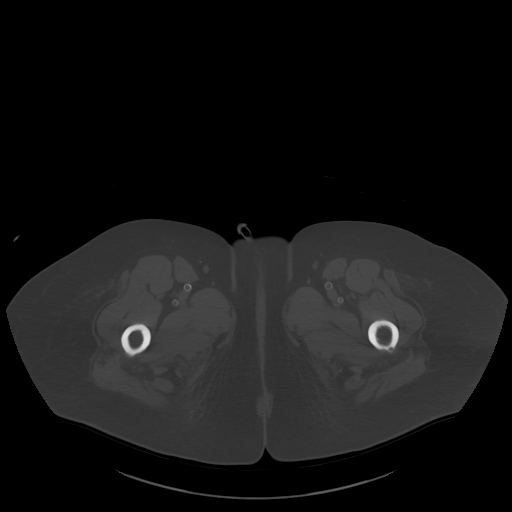
[im 11/88  soft-tissue]
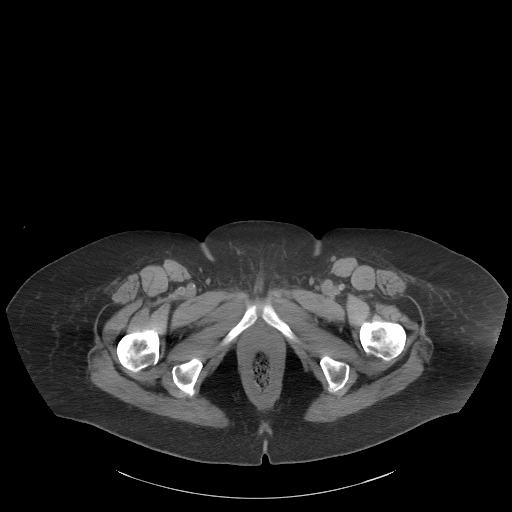
[im 18/88  soft-tissue]
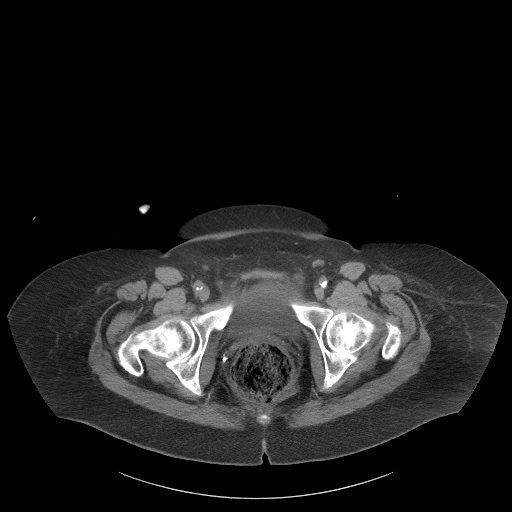
[im 25/88  soft-tissue]
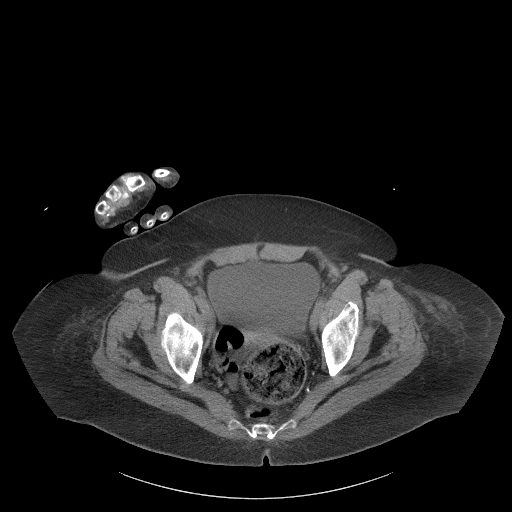
[im 32/88  soft-tissue]
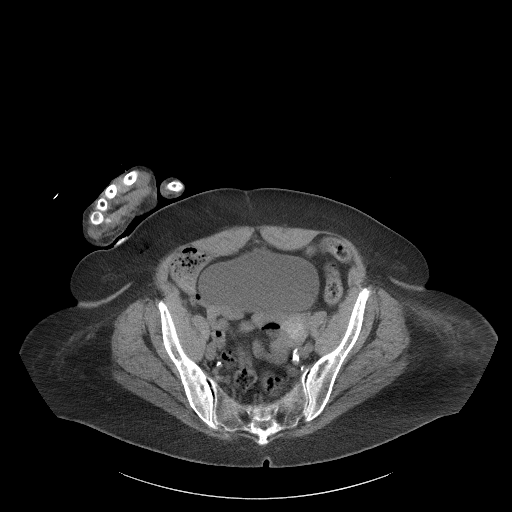
[im 39/88  soft-tissue]
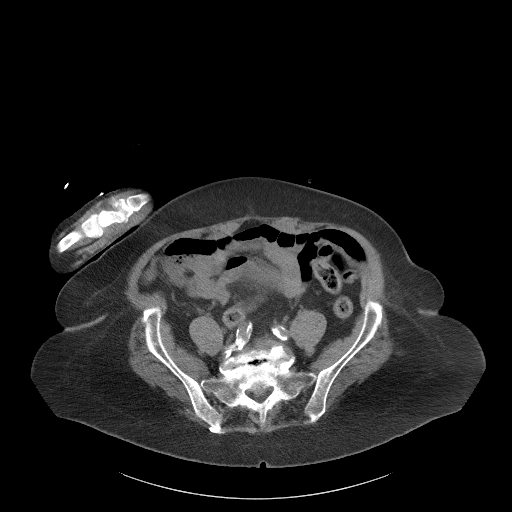
[im 46/88  soft-tissue]
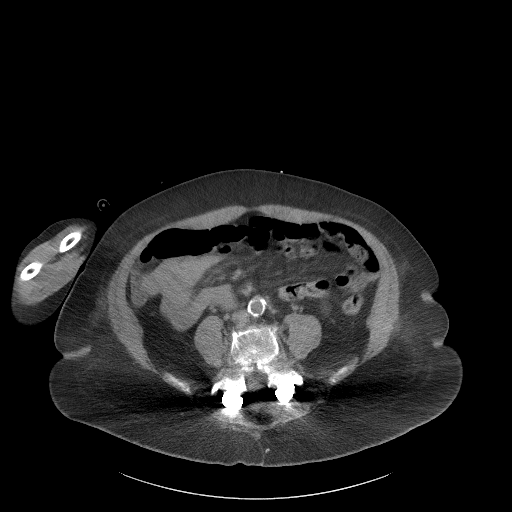
[im 49/88  soft-tissue]
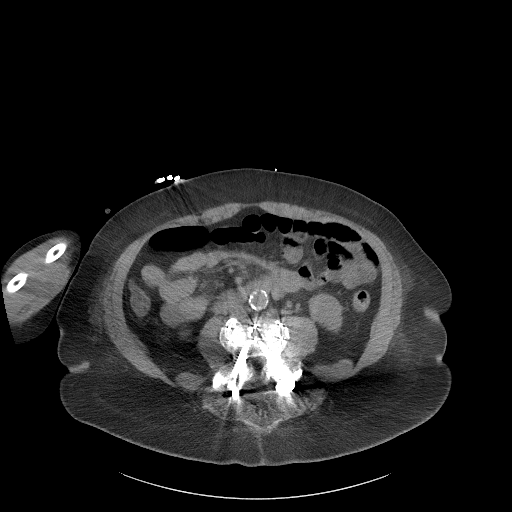
[im 56/88  soft-tissue]
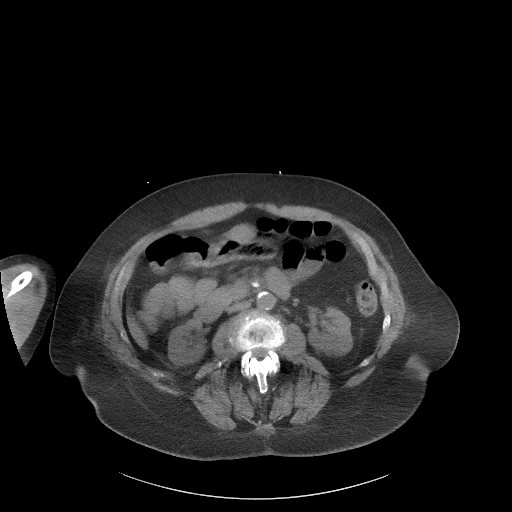
[im 56/88  bone]
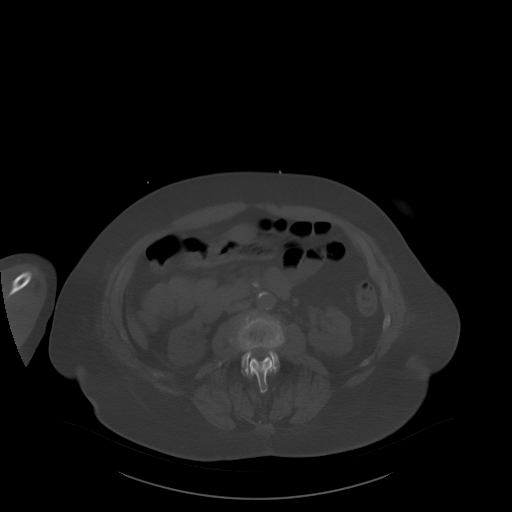
[im 63/88  soft-tissue]
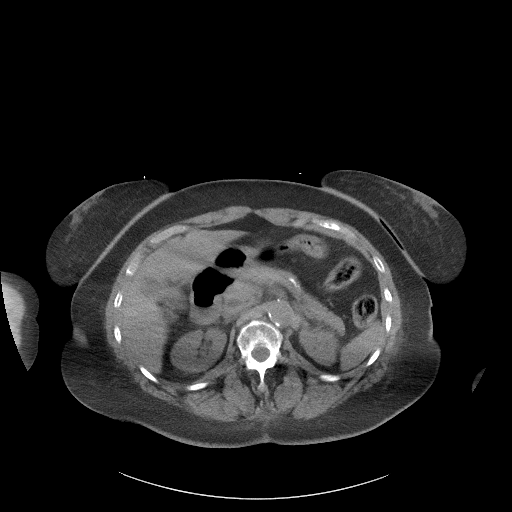
[im 70/88  soft-tissue]
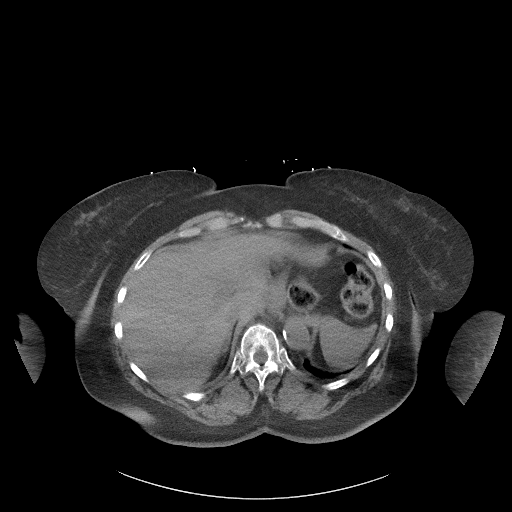
[im 77/88  soft-tissue]
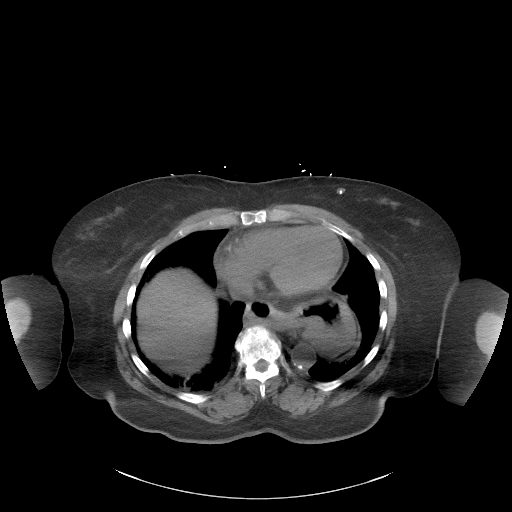
[im 84/88  soft-tissue]
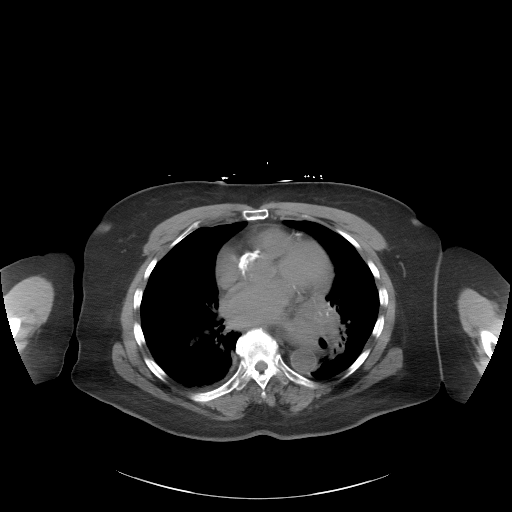

[Series 6: a/p w/o cor · coronal · non-contrast · 0.86mm/px · 3 of 151 slices shown]
[im 51/151  soft-tissue]
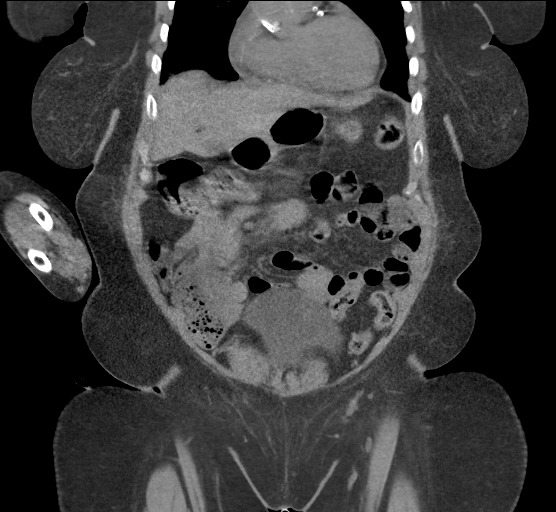
[im 67/151  soft-tissue]
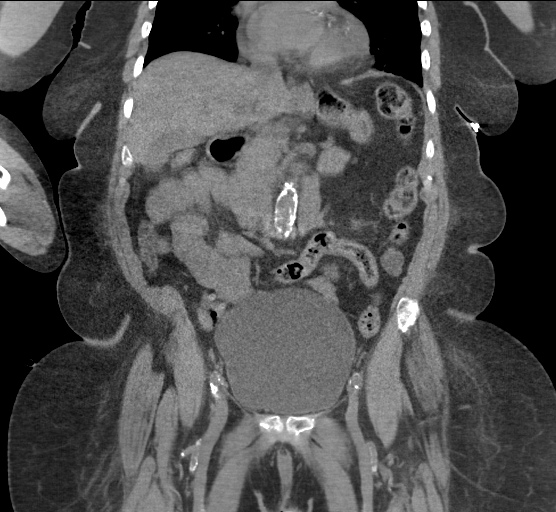
[im 84/151  soft-tissue]
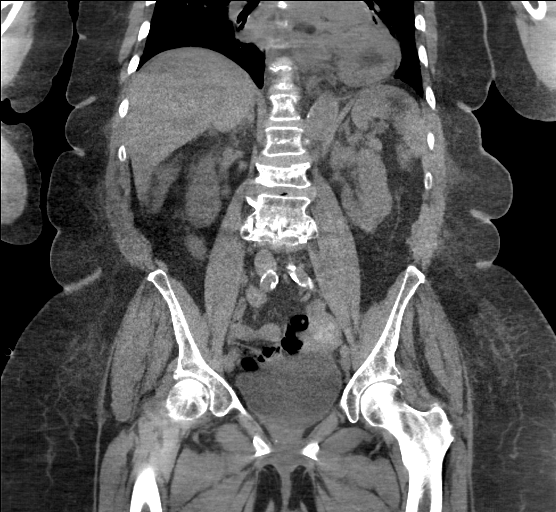

[16 of 46 positions shown; findings below may reference images not displayed]

FINDINGS: Lower chest: Large hiatal hernia with adjacent atelectasis. Linear
atelectasis in the right lung base. Coronary artery calcifications.
Calcifications of the aortic valve.

Hepatobiliary: Unremarkable noncontrast appearance of the hepatic
parenchyma. Layering sludge and/or cholelithiasis without evidence
of acute cholecystitis. No biliary ductal dilation.

Pancreas: No pancreatic ductal dilation or evidence of acute
inflammation.

Spleen: No splenomegaly or focal splenic lesion.

Adrenals/Urinary Tract: Bilateral adrenal glands are within normal
limits.

No hydronephrosis. Vascular calcifications in the bilateral renal
hila. No renal, ureteral or bladder calculi identified. Left upper
pole renal sinus cysts which are benign and require no follow-up.
Urinary bladder is distended with urine, extending above the sacral
promontory.

Stomach/Bowel: Large hiatal hernia. Stomach is unremarkable for
degree of distension. No pathologic dilation of large or small
bowel. Colonic diverticulosis without findings of acute
diverticulitis. Moderate rectal stool burden with minimal wall
thickening adjacent perirectal fat stranding.

Vascular/Lymphatic: Extensive aortic and branch vessel
atherosclerosis without abdominal aortic aneurysm. No pathologically
enlarged abdominal lymph nodes.

Reproductive: Probable prior hysterectomy. Hyperdense nodular area
along the left adnexa measures approximally 3.7 cm on image 58/3
possibly reflecting bowel or a pelvic/adnexal lesion.

Other: No significant abdominopelvic free fluid.

Musculoskeletal: Prior L4-L5 posterior spinal fusion with advanced
multilevel degenerative changes spine.
IMPRESSION: 1. Urinary bladder is distended with urine, extending above the
sacral promontory. Correlate for urinary retention.
2. No hydronephrosis.
3. Hyperdense nodular area along the left adnexa measures
approximally 3.7 cm possibly reflecting bowel or a pelvic/adnexal
lesion but incompletely evaluated on this noncontrast examination.
Consider further evaluation with pelvic ultrasound.
4. Large hiatal hernia.
5. Colonic diverticulosis without findings of acute diverticulitis.
6. Layering sludge and/or cholelithiasis without evidence of acute
cholecystitis.
7.  Aortic Atherosclerosis ([1W]-[1W]).

## 2022-04-05 IMAGING — MR MR HEAD W/O CM
12 of 13 series · 44 of 48 positions shown · non-contrast
Comparison: Head CT from earlier today

CLINICAL DATA: Delirium.  Unresponsive with pinpoint pupils.

EXAM:
MRI HEAD WITHOUT CONTRAST
TECHNIQUE: Multiplanar, multiecho pulse sequences of the brain and surrounding
structures were obtained without intravenous contrast.

[Series 5: DWI · axial · 3.0mm · 0.88mm/px · z∈[-100,+55]mm · 7 of 106 slices shown (1 of 4)]
[im 1/106]
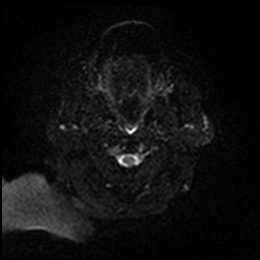
[im 18/106]
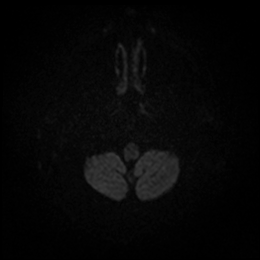
[im 36/106]
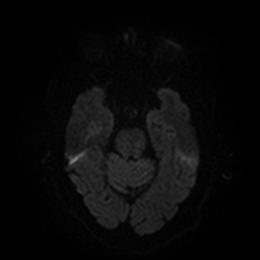
[im 53/106]
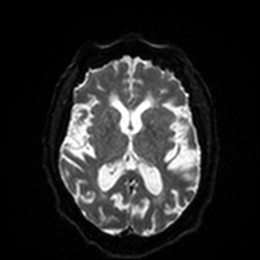
[im 71/106]
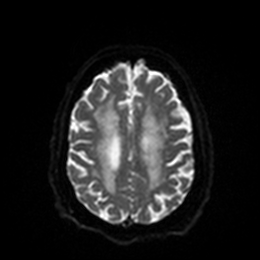
[im 88/106]
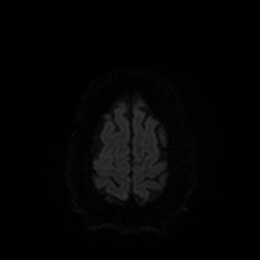
[im 106/106]
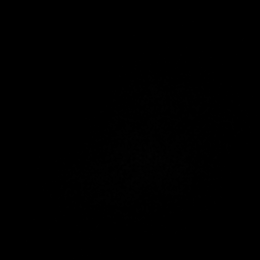

[Series 6: DWI · axial · 3.0mm · 0.88mm/px · z∈[-100,+46]mm · 4 of 50 slices shown (2 of 4)]
[im 1/50]
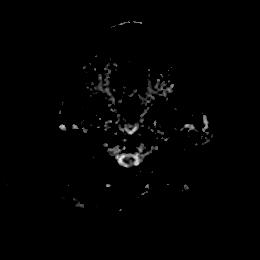
[im 17/50]
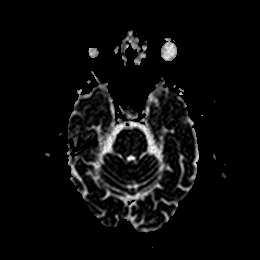
[im 33/50]
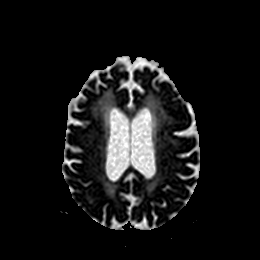
[im 50/50]
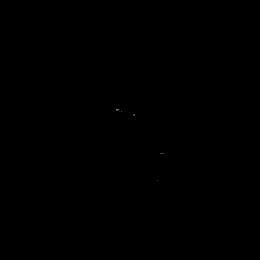

[Series 7: DWI · coronal · 4.0mm · 0.88mm/px · 6 of 78 slices shown (3 of 4)]
[im 1/78]
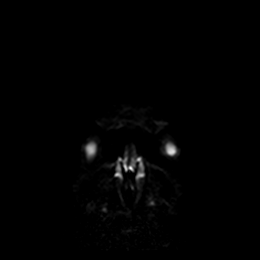
[im 16/78]
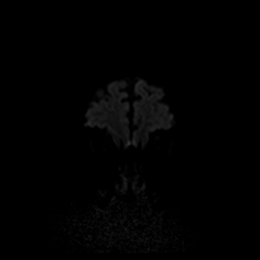
[im 31/78]
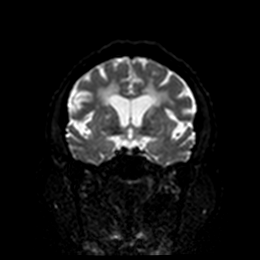
[im 47/78]
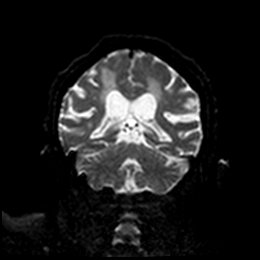
[im 62/78]
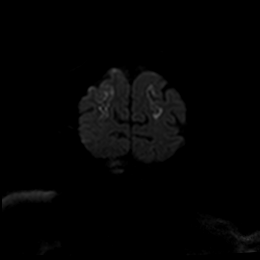
[im 78/78]
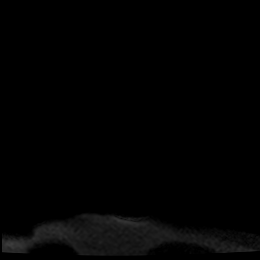

[Series 8: DWI · coronal · 4.0mm · 0.88mm/px · 3 of 39 slices shown (4 of 4)]
[im 1/39]
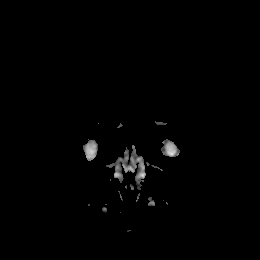
[im 20/39]
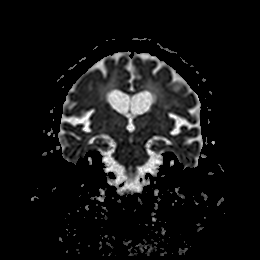
[im 39/39]
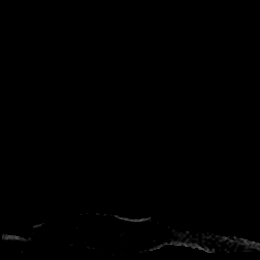

[Series 9: T1 · sagittal · 5.0mm · 0.75mm/px · 2 of 28 slices shown]
[im 1/28]
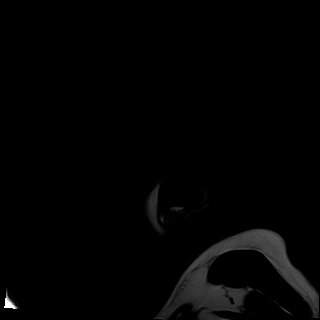
[im 28/28]
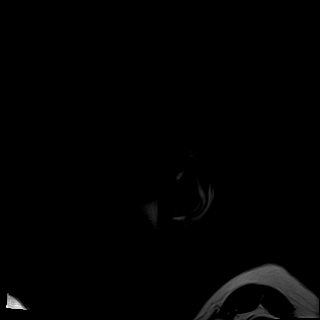

[Series 10: T2 · axial · 5.0mm · 0.72mm/px · z∈[-103,+58]mm · 2 of 28 slices shown (1 of 2)]
[im 1/28]
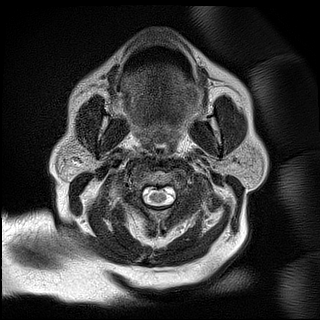
[im 28/28]
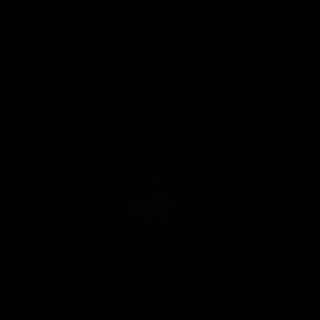

[Series 11: FLAIR · axial · 5.0mm · 0.45mm/px · z∈[-103,+59]mm · 2 of 28 slices shown]
[im 1/28]
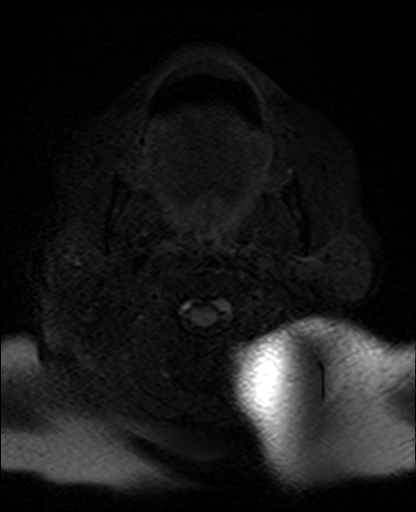
[im 28/28]
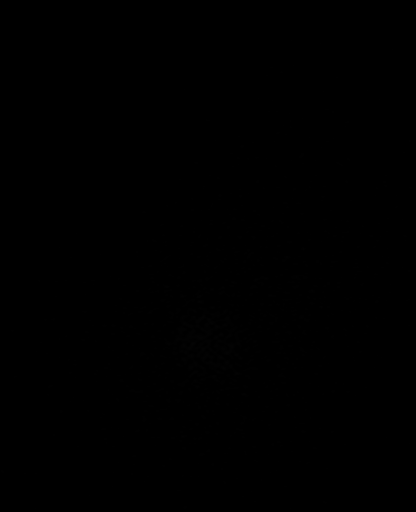

[Series 12: mag_images · axial · 3.0mm · 0.90mm/px · z∈[-118,+59]mm · 4 of 60 slices shown]
[im 1/60]
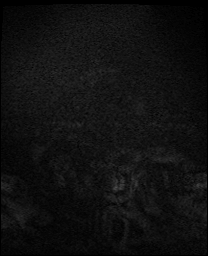
[im 20/60]
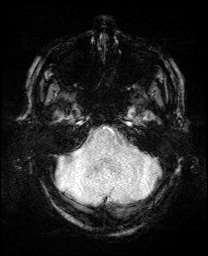
[im 40/60]
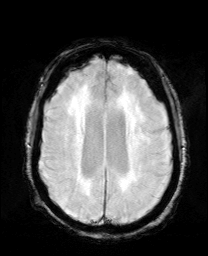
[im 60/60]
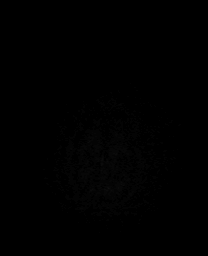

[Series 13: pha_images · axial · 3.0mm · 0.90mm/px · z∈[-115,+53]mm · 4 of 57 slices shown]
[im 1/57]
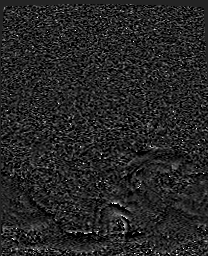
[im 19/57]
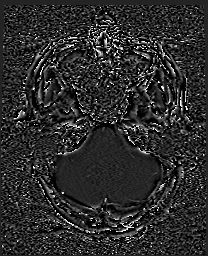
[im 38/57]
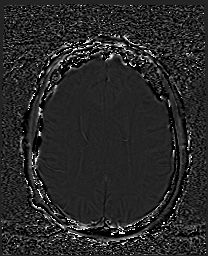
[im 57/57]
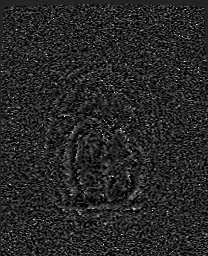

[Series 14: swi_images · axial · 3.0mm · 0.90mm/px · z∈[-118,+59]mm · 4 of 60 slices shown]
[im 1/60]
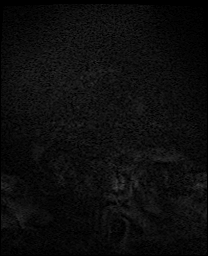
[im 20/60]
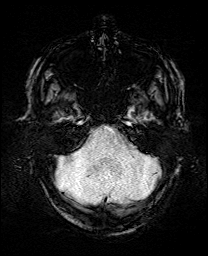
[im 40/60]
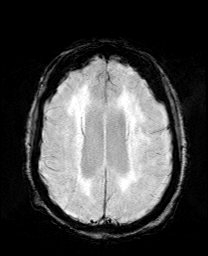
[im 60/60]
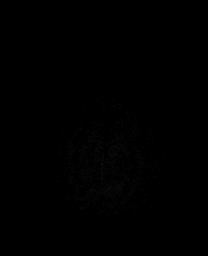

[Series 15: mip_images(sw) · axial · 24.0mm · 0.90mm/px · z∈[-108,+48]mm · 4 of 53 slices shown]
[im 1/53]
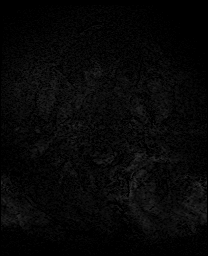
[im 18/53]
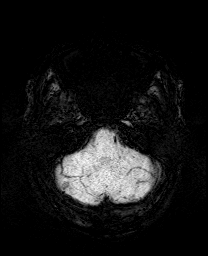
[im 35/53]
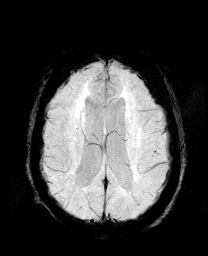
[im 53/53]
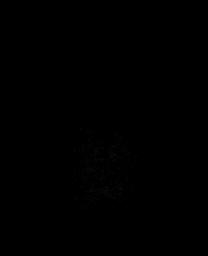

[Series 17: T2 · coronal · 5.0mm · 0.34mm/px · 2 of 32 slices shown (2 of 2)]
[im 1/32]
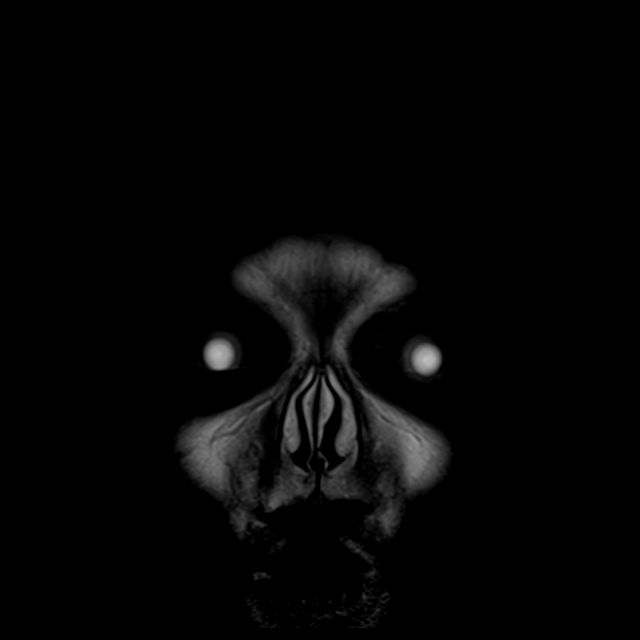
[im 32/32]
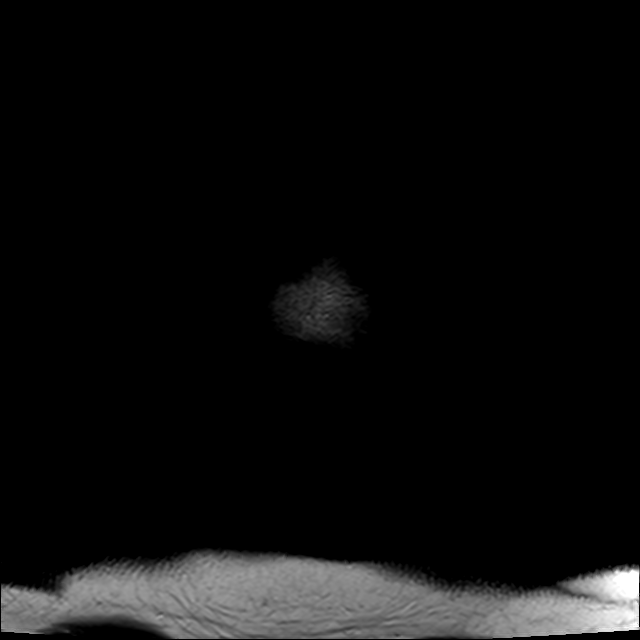

[44 of 48 positions shown; findings below may reference images not displayed]

FINDINGS: Brain: Linear increased diffusion signal along the bilateral
parietal cortex and faintly seen in the bilateral globus pallidus.
No additional areas of edematous cortex to imply background
posterior reversible encephalopathy syndrome. There is a background
of advanced chronic small vessel ischemia with confluent gliosis in
the deep white matter. Small remote right cerebellar infarct.
Cerebral volume loss in keeping with age. No hemorrhage,
hydrocephalus, or masslike finding

Vascular: Major flow voids are preserved.

Skull and upper cervical spine: Cervical spine degeneration where
covered. No focal marrow lesion.

Sinuses/Orbits: Bilateral cataract resection.
IMPRESSION: 1. Symmetric restricted diffusion in the parietal cortex and basal
ganglia suggesting global anoxic insult.
2. Extensive chronic small vessel ischemia.

## 2022-04-05 IMAGING — DX DG CHEST 1V PORT
1 series · 1 of 1 positions shown · non-contrast
Comparison: None Available.

CLINICAL DATA: Altered mental status.

EXAM:
PORTABLE CHEST 1 VIEW

[chest ap]
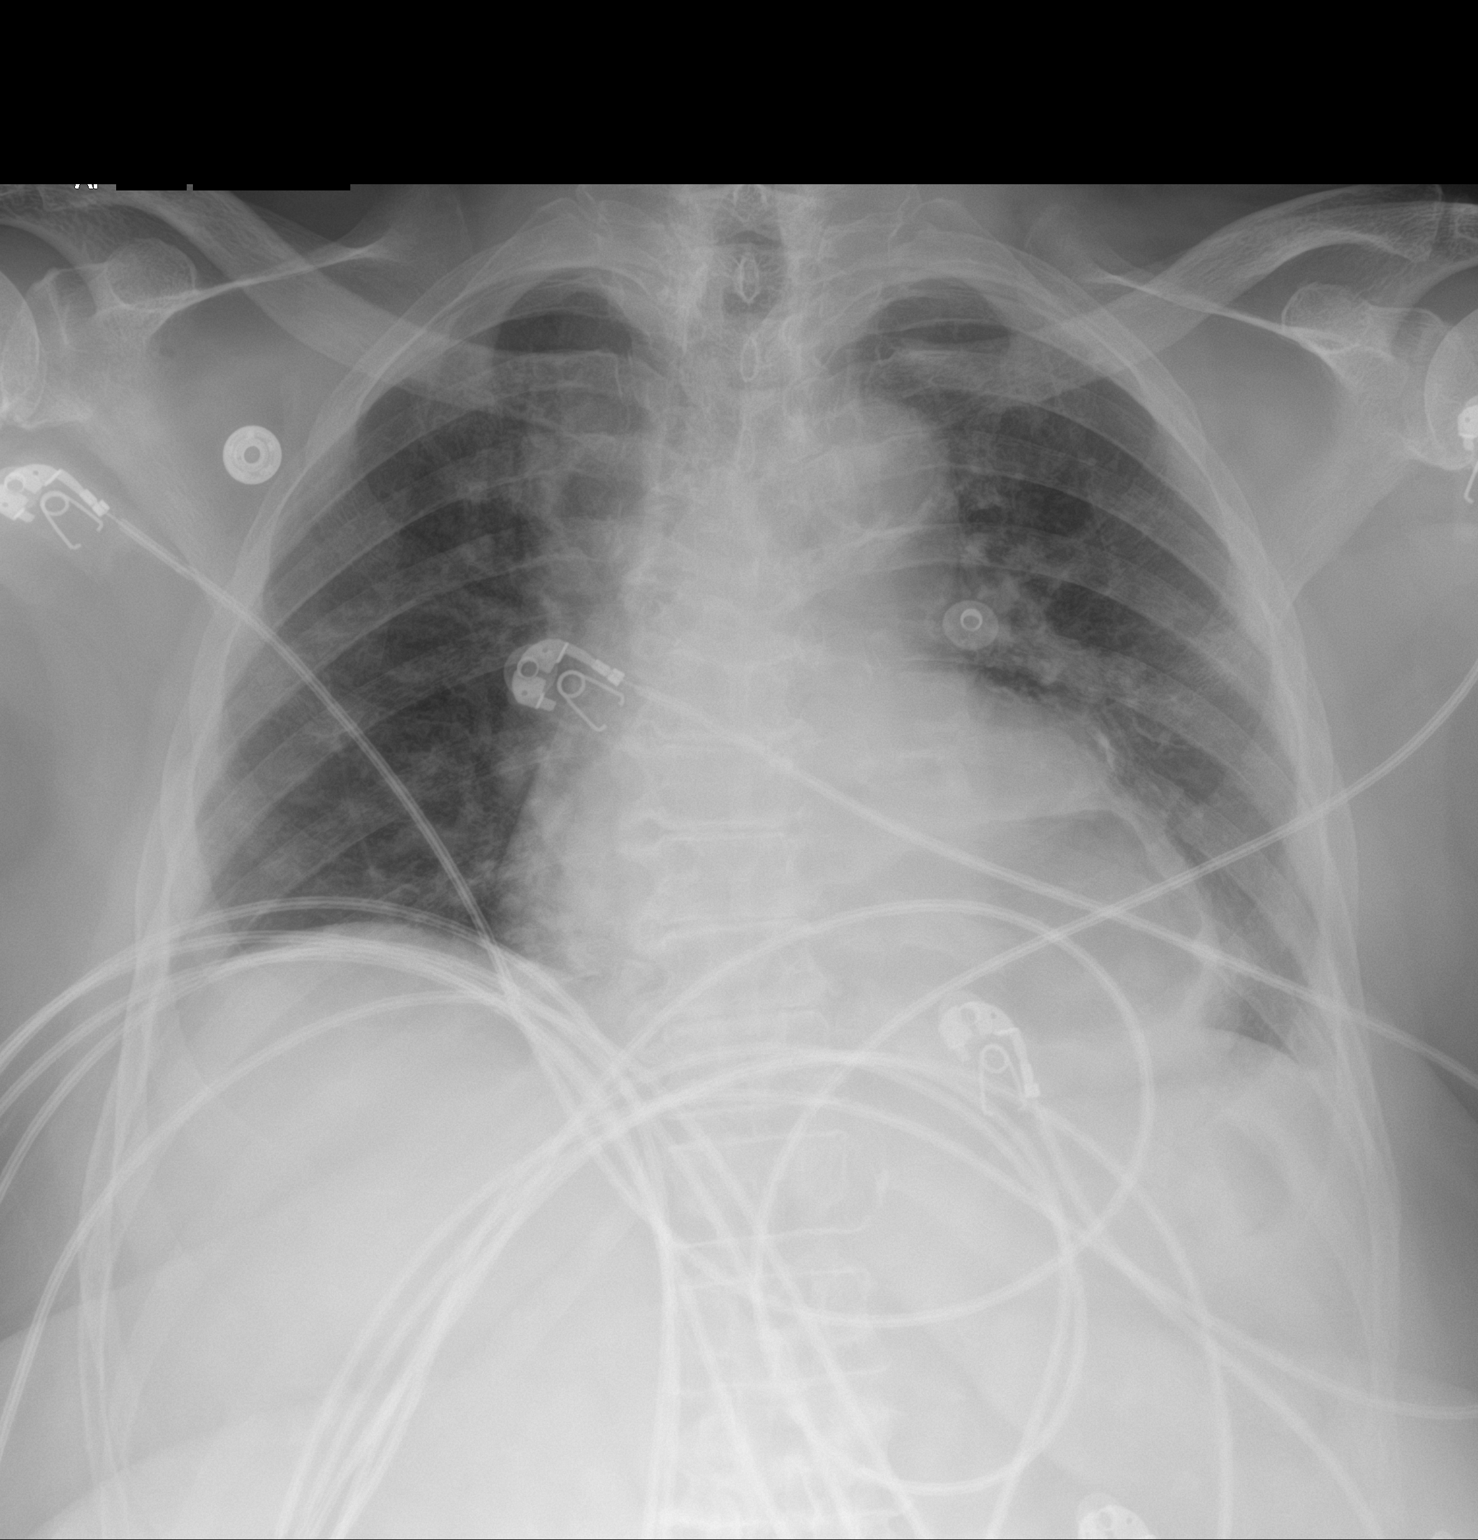

[1 of 1 positions shown; findings below may reference images not displayed]

FINDINGS: Mild cardiomegaly with mild central vascular congestion. No focal
consolidation, pleural effusion, pneumothorax. There is moderate
size hiatal hernia. Atherosclerotic calcification of the aorta.
Degenerative changes of spine. No acute osseous pathology.
IMPRESSION: Mild cardiomegaly with mild central vascular congestion.

## 2022-04-05 IMAGING — DX DG ABD PORTABLE 1V
1 series · 1 of 1 positions shown · non-contrast
Comparison: None Available.

CLINICAL DATA: MRI clearance.

EXAM:
PORTABLE ABDOMEN - 1 VIEW

[abdomen supine]
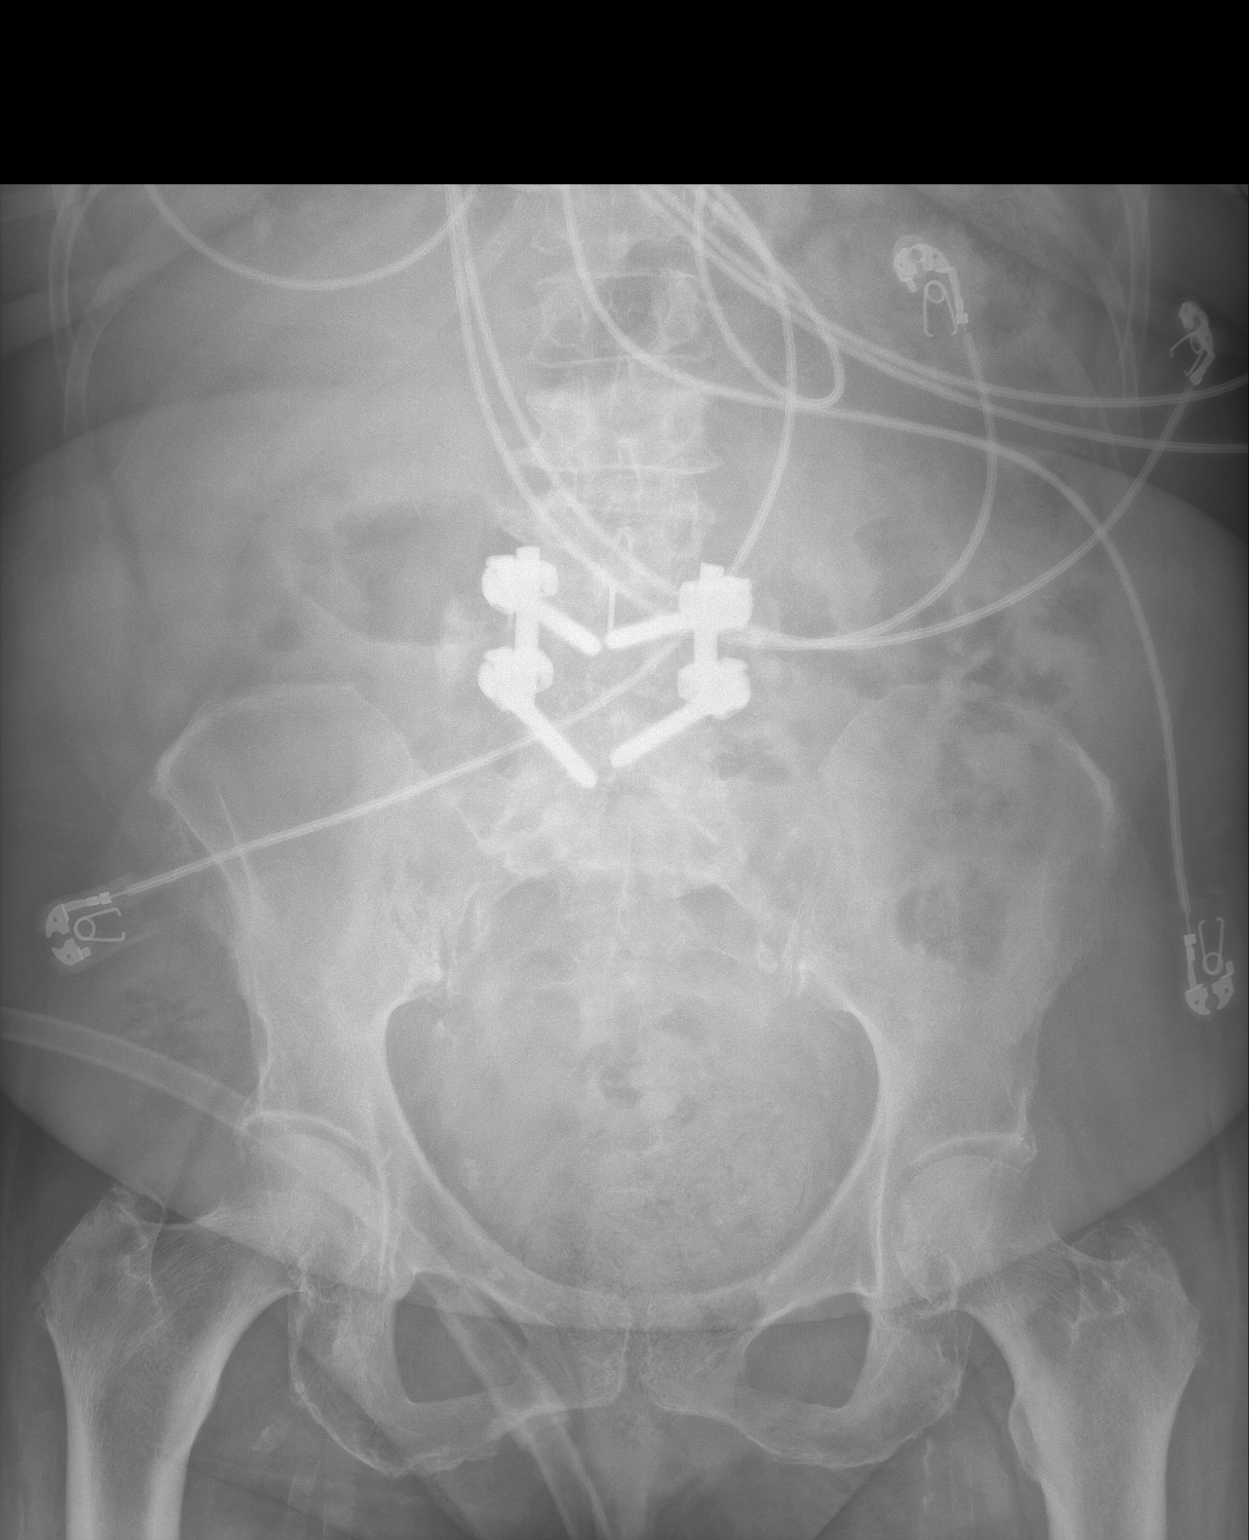

[1 of 1 positions shown; findings below may reference images not displayed]

FINDINGS: Posterior spinal fusion hardware noted at L4-5. Linear metallic
foreign body overlies the L3-4 interspace. Bowel gas pattern is
unremarkable. Bones are diffusely demineralized. Telemetry leads
overlie the lower chest and upper abdomen.
IMPRESSION: Posterior spinal fusion hardware at L4-5. Additional linear metallic
foreign body at the level of the L3-4 interspace indeterminate on
frontal projection, possibly a surgical clip. Lateral lumbar spine
x-ray could be used to further evaluate as clinically warranted.

## 2022-04-05 MED ORDER — METRONIDAZOLE 500 MG/100ML IV SOLN
500.0000 mg | Freq: Once | INTRAVENOUS | Status: AC
  Administered 2022-04-05: 500 mg via INTRAVENOUS
  Filled 2022-04-05: qty 100

## 2022-04-05 MED ORDER — ONDANSETRON HCL 4 MG/2ML IJ SOLN: 4.0000 mg | Freq: Four times a day (QID) | INTRAMUSCULAR | Status: DC | PRN

## 2022-04-05 MED ORDER — THIAMINE HCL 100 MG/ML IJ SOLN
500.0000 mg | Freq: Every day | INTRAVENOUS | Status: AC
  Administered 2022-04-05 – 2022-04-08 (×4): 500 mg via INTRAVENOUS
  Filled 2022-04-05 (×4): qty 5

## 2022-04-05 MED ORDER — ENOXAPARIN SODIUM 40 MG/0.4ML IJ SOSY
40.0000 mg | PREFILLED_SYRINGE | INTRAMUSCULAR | Status: DC
  Administered 2022-04-05: 40 mg via SUBCUTANEOUS
  Filled 2022-04-05: qty 0.4

## 2022-04-05 MED ORDER — POLYETHYLENE GLYCOL 3350 17 G PO PACK: 17.0000 g | PACK | Freq: Every day | ORAL | Status: DC | PRN

## 2022-04-05 MED ORDER — SODIUM CHLORIDE 0.9 % IV SOLN
2.0000 g | INTRAVENOUS | Status: AC
  Administered 2022-04-05 – 2022-04-07 (×3): 2 g via INTRAVENOUS
  Filled 2022-04-05 (×3): qty 20

## 2022-04-05 MED ORDER — LACTATED RINGERS IV SOLN: INTRAVENOUS | Status: DC

## 2022-04-05 MED ORDER — SODIUM CHLORIDE 0.9 % IV BOLUS
1000.0000 mL | Freq: Once | INTRAVENOUS | Status: AC
  Administered 2022-04-05: 1000 mL via INTRAVENOUS

## 2022-04-05 MED ORDER — VANCOMYCIN HCL IN DEXTROSE 1-5 GM/200ML-% IV SOLN: 1000.0000 mg | INTRAVENOUS | Status: DC

## 2022-04-05 MED ORDER — NOREPINEPHRINE 4 MG/250ML-% IV SOLN
0.0000 ug/min | INTRAVENOUS | Status: DC
  Administered 2022-04-05: 10 ug/min via INTRAVENOUS
  Filled 2022-04-05: qty 250

## 2022-04-05 MED ORDER — LACTATED RINGERS IV BOLUS
1000.0000 mL | Freq: Once | INTRAVENOUS | Status: AC
Start: 2022-04-05 — End: 2022-04-05
  Administered 2022-04-05: 1000 mL via INTRAVENOUS

## 2022-04-05 MED ORDER — ONDANSETRON HCL 4 MG PO TABS: 4.0000 mg | ORAL_TABLET | Freq: Four times a day (QID) | ORAL | Status: DC | PRN

## 2022-04-05 MED ORDER — DEXTROSE-NACL 5-0.9 % IV SOLN
INTRAVENOUS | Status: AC
  Administered 2022-04-06: 900 mL via INTRAVENOUS

## 2022-04-05 MED ORDER — VANCOMYCIN HCL 1500 MG/300ML IV SOLN
1500.0000 mg | Freq: Once | INTRAVENOUS | Status: AC
  Administered 2022-04-05: 1500 mg via INTRAVENOUS
  Filled 2022-04-05: qty 300

## 2022-04-05 MED ORDER — SODIUM CHLORIDE 0.9 % IV SOLN
2.0000 g | Freq: Once | INTRAVENOUS | Status: AC
  Administered 2022-04-05: 2 g via INTRAVENOUS
  Filled 2022-04-05: qty 12.5

## 2022-04-05 MED ORDER — HYDRALAZINE HCL 20 MG/ML IJ SOLN: 10.0000 mg | Freq: Four times a day (QID) | INTRAMUSCULAR | Status: DC | PRN

## 2022-04-05 MED ORDER — ENOXAPARIN SODIUM 30 MG/0.3ML IJ SOSY
30.0000 mg | PREFILLED_SYRINGE | INTRAMUSCULAR | Status: DC
  Administered 2022-04-06 – 2022-04-08 (×3): 30 mg via SUBCUTANEOUS
  Filled 2022-04-05 (×3): qty 0.3

## 2022-04-05 MED ORDER — SODIUM CHLORIDE 0.9 % IV SOLN
2.0000 g | Freq: Once | INTRAVENOUS | Status: DC
  Filled 2022-04-05: qty 10

## 2022-04-05 MED ORDER — ACETAMINOPHEN 325 MG PO TABS: 650.0000 mg | ORAL_TABLET | Freq: Four times a day (QID) | ORAL | Status: DC | PRN

## 2022-04-05 MED ORDER — VANCOMYCIN HCL IN DEXTROSE 1-5 GM/200ML-% IV SOLN
1000.0000 mg | Freq: Once | INTRAVENOUS | Status: DC
  Filled 2022-04-05: qty 200

## 2022-04-05 MED ORDER — ACETAMINOPHEN 650 MG RE SUPP: 650.0000 mg | Freq: Four times a day (QID) | RECTAL | Status: DC | PRN

## 2022-04-05 MED ORDER — INSULIN ASPART 100 UNIT/ML IJ SOLN
0.0000 [IU] | Freq: Four times a day (QID) | INTRAMUSCULAR | Status: DC
  Administered 2022-04-05: 3 [IU] via SUBCUTANEOUS

## 2022-04-05 NOTE — Progress Notes (Signed)
Patient not available for EEG at this time, gone to imaging  Will check back later.Marland Kitchen

## 2022-04-05 NOTE — Assessment & Plan Note (Signed)
.   Patient exhibiting evidence of acute kidney injury secondary to volume depletion and complicated urinary tract function . Creatinine is currently 2.09 an increase compared to baseline of 1.4 . Hydrating patient with intravenous isotonic fluids. . Strict input and output monitoring . Monitoring renal function and electrolytes with serial chemistries . Avoiding nephrotoxic agents if at all possible . If renal function fails to rapidly improve will consider expanding work-up with urine electrolytes

## 2022-04-05 NOTE — H&P (Signed)
History and Physical    Patient: Holly Valdez MRN: [id number] DOA: 04/05/2022  Date of Service: the patient was seen and examined on 04/05/2022  Patient coming from: SNF  Chief Complaint:  Chief Complaint  Patient presents with   Altered Mental Status    HPI:   86 year old female with past medical history of non-insulin-dependent diabetes mellitus type 2, chronic kidney disease stage IIIb, hyperlipidemia, dementia, chronic low back pain who presents from [place] facility via EMS for unresponsiveness hypotension and bradycardia.  History is limited as patient is currently nonverbal and minimally responsive.  Furthermore, we have been unable to contact anyone at the patient's facility to gather further details as to what transpired yesterday evening.  According to EMS, was at her baseline at approximately 6 PM yesterday.  Patient was found to be unresponsive later in the evening and EMS was contacted who promptly came to evaluate the patient and noted that she was bradycardic with a heart rate of 44 and hypotensive with initial blood pressure of 50/38.  Intravenous fluids were initiated and patient was brought into [facility] emergency department for evaluation.  Upon arrival to the emergency department patient was initially administered a dose of Narcan with minimal effect.  Initial concern for sepsis prompted further administration of a 1 L normal saline bolus followed by initiation of Levophed due to hypotension with systolic blood pressures in the 50s and 60s.  Patient was administered broad-spectrum intravenous antibiotics with a dose of cefepime, Flagyl and vancomycin.  EDP discussed case with Dr. Curly Shores with neurology who graciously came to see the patient in consultation.  The hospitalist group was additionally called to assess the patient for admission in the hospital.   Review of Systems: Review of Systems  Unable to perform ROS: Patient  unresponsive    Past Medical History:  Diagnosis Date   Dementia ([place])    Diabetes mellitus without complication ([place])    Hypertension     History reviewed. No pertinent surgical history.  Social History:  reports that she has quit smoking. She has never used smokeless tobacco. She reports that she does not drink alcohol and does not use drugs.  Allergies  Allergen Reactions   Penicillins Other (See Comments)    Unknown reaction - Tolerated keflex and ceftriaxone while hospitalized 09/2020    Family History  Family history unknown: Yes    Prior to Admission medications   Medication Sig Start Date End Date Taking? Authorizing Provider  atorvastatin (LIPITOR) 40 MG tablet Take 40 mg by mouth every evening. 07/28/16  Yes [provider]  calcium carbonate (OSCAL) 1500 (600 Ca) MG TABS tablet Take 1,500 mg by mouth 2 (two) times daily.   Yes [provider]  Cholecalciferol 50 MCG (2000 UT) CAPS Take 1 capsule by mouth daily.   Yes [provider]  Docusate Sodium (DSS) 100 MG CAPS Take 100 mg by mouth daily as needed (constipation). 06/03/20  Yes [provider]  DULoxetine HCl 30 MG CSDR Take 30 mg by mouth daily. 09/15/20  Yes [provider]  gabapentin (NEURONTIN) 300 MG capsule Take 300 mg by mouth 3 (three) times daily.   Yes [provider]  hydrALAZINE (APRESOLINE) 25 MG tablet Take 25 mg by mouth 3 (three) times daily. 01/10/19  Yes [provider]  insulin aspart (NOVOLOG) 100 UNIT/ML injection Inject into the skin See admin instructions. Sliding scale  70-150 0 units  151-200 2 units  201-250 4  units  251-300 6 units  301-400 8 units   Yes [provider]  insulin glargine (LANTUS) 100 UNIT/ML Solostar Pen Inject 15 Units into the skin daily. Patient taking differently: Inject 45 Units into the skin 2 (two) times daily. 10/15/20 04/05/22 Yes Iona Beard, MD  linagliptin (TRADJENTA) 5 MG TABS tablet  Take 5 mg by mouth daily.   Yes [provider]  losartan (COZAAR) 100 MG tablet Take 100 mg by mouth daily. 02/13/18  Yes [provider]  melatonin 3 MG TABS tablet Take 3 mg by mouth at bedtime.   Yes [provider]  Nutritional Supplements (GLUCERNA 1.2 CAL PO) Take 1 tablet by mouth in the morning, at noon, in the evening, and at bedtime.   Yes [provider]  diclofenac Sodium (VOLTAREN) 1 % GEL Apply 4 g topically 4 (four) times daily. Patient not taking: Reported on 04/05/2022 03/10/21   Deno Etienne, DO    Physical Exam:  Vitals:   04/05/22 0745 04/05/22 0824 04/05/22 0830 04/05/22 0914  BP: (!) 114/49  (!) 160/69 (!) 153/83  Pulse: 81 94 83 81  Resp: 14 19 18 18   Temp:  (!) 96.3 F (35.7 C)  98 F (36.7 C)  TempSrc:  Rectal  Oral  SpO2: 100% 99% 100% 100%    Constitutional: Minimally responsive only to pain,  no associated distress.   Skin: no rashes, no lesions, poor skin turgor noted. Eyes: Pupils are equally reactive to light.  No evidence of scleral icterus or conjunctival pallor.  ENMT: Dry mucous membranes noted.  Unable to fully evaluate posterior oropharynx due to patient not following commands  neck: normal, supple, no masses, no thyromegaly.  No evidence of jugular venous distension.   Respiratory: Significant rales noted in the bilateral mid and lower fields with scattered rhonchi bilaterally.  Occasional expiratory wheezing noted. Normal respiratory effort. No accessory muscle use.  Cardiovascular: Regular rate and rhythm, no murmurs / rubs / gallops. No extremity edema. 2+ pedal pulses. No carotid bruits.  Chest:   Nontender without crepitus or deformity.   Back:   Nontender without crepitus or deformity. Abdomen: Protuberant abdomen that is firm to the touch but apparently nontender.  No evidence of intra-abdominal masses.  Positive bowel sounds noted in all quadrants.   Musculoskeletal: No joint deformity upper and lower  extremities. Good ROM, no contractures. Normal muscle tone.  Neurologic: Patient does localize to pain.  Patient is nonverbal.  Patient is not following commands.   Psychiatric: Unable to assess due to profound lethargy and minimal responsiveness.  Patient currently does not seem to possess insight as to her current situation.    Data Reviewed:  I have personally reviewed and interpreted labs, imaging.  Significant findings are:  Canister revealing glucose of 240, bicarbonate 19, BUN 31, creatinine 2.09 CBC revealing white blood cell count of 7.6 with hemoglobin of 9.7 and hematocrit 30.6. Troponins found to be slightly elevated at 54 and 47. VBG revealing pH of 7.38 with PCO2 of 37.9 and PO2 of 65 CT imaging of the head without acute intracranial abnormality. Chest x-ray personally reviewed revealing increased vascular congestion MRI brain without contrast revealing symmetric restricted diffusion in the parietal cortex and basal ganglia suggesting global anoxic injury.  EKG: Personally reviewed.  Rhythm is normal sinus rhythm with heart rate of 69 bpm.  No dynamic ST segment changes appreciated.   Assessment and Plan: * Acute metabolic encephalopathy Patient presenting to the hospital via EMS completely  unresponsive which has improved somewhat throughout the ED course with patient not responding to pain Patient undergoing extensive work-up for various causes of encephalopathy Thus far, patient has been found to be volume depleted due to profound hypotension on arrival with a urinary tract infection and evidence of anoxic brain injury Therefore patient's current degree of profound encephalopathy is likely multifactorial Prognosis guarded Treating underlying urinary tract infection with intravenous antibiotic Hydrating patient with intravenous isotonic fluids for management of volume depletion Supportive care for anoxic brain injury, appreciate neurology's recommendations for management  of this Monitoring closely for patient's response to medical therapy  Complicated UTI (urinary tract infection) Urinalysis highly suggestive of urinary tract infection UTI complicated by concurrent acute kidney injury Abdomen distended and firm on exam Treating with broad-spectrum intravenous antibiotic therapy for now which will be narrowed as cultures result Obtaining CT imaging of the abdomen considering concurrent acute kidney injury and diffuse firm abdomen on exam.  This should evaluate for any evidence of hydronephrosis, stone or other source of infection Hydrating with intravenous isotonic fluids  Acute renal failure superimposed on stage 3b chronic kidney disease ([place]) Patient exhibiting evidence of acute kidney injury secondary to volume depletion and complicated urinary tract function Creatinine is currently 2.09 an increase compared to baseline of 1.4 Hydrating patient with intravenous isotonic fluids. Strict input and output monitoring Monitoring renal function and electrolytes with serial chemistries Avoiding nephrotoxic agents if at all possible If renal function fails to rapidly improve will consider expanding work-up with urine electrolytes   Anoxic brain injury [facility]) Surprising finding of global anoxic insult on MRI brain Brings about questions concerning the time course of patient's current illness No clinical evidence of significant hypoxia here and therefore there is reasonable concern for carbon monoxide poisoning Appreciate neurology's advice and working patient up for carbon monoxide, work-up pending Supportive care otherwise, appreciate neurology recommendations Supplemental oxygen as necessary Prognosis guarded, once we are able to contact the family goal of care must be discussed  Lactic acidosis Lactic acidosis likely secondary to sepsis with concurrent volume depletion Hydrate patient intravenous isotonic fluids while concurrently treating underlying  infection Monitoring serial lactic acid levels to ensure downtrending and resolution.   Elevated troponin level not due myocardial infarction Slightly elevated serial troponins with flat trajectory of elevation Likely secondary to underlying illness, plaque rupture is unlikely Monitoring patient on telemetry   Type 2 diabetes mellitus with stage 3b chronic kidney disease, with long-term current use of insulin (HCC) Due to tenuous oral intake, holding basal insulin therapy for now Scheduled Accu-Cheks every 6 hours with sliding scale insulin Obtaining hemoglobin A1c We will titrate regimen as necessary to achieve glycemic control       Code Status:  DNR  code status decision has been confirmed with: Review of skilled nursing facility records, previous hospital records Family Communication: Unable to get in touch with family despite multiple attempts  Consults: Dr. Parke Simmers with neurology  Severity of Illness:  The appropriate patient status for this patient is INPATIENT. Inpatient status is judged to be reasonable and necessary in order to provide the required intensity of service to ensure the patient's safety. The patient's presenting symptoms, physical exam findings, and initial radiographic and laboratory data in the context of their chronic comorbidities is felt to place them at high risk for further clinical deterioration. Furthermore, it is not anticipated that the patient will be medically stable for discharge from the hospital within 2 midnights of admission.   * I certify  that at the point of admission it is my clinical judgment that the patient will require inpatient hospital care spanning beyond 2 midnights from the point of admission due to high intensity of service, high risk for further deterioration and high frequency of surveillance required.*  Author:  Vernelle Emerald MD  04/05/2022 9:21 AM

## 2022-04-05 NOTE — Progress Notes (Signed)
Pharmacy Antibiotic Note  Holly Valdez is a 85 y.o. female admitted on 04/05/2022 with AMS, possible sepsis.  Pharmacy has been consulted for Vancomycin dosing.  Vancomycin 1500 mg IV given in ED at 0200  Plan: Vancomycin 1000 mg IV q48h     Temp (24hrs), Avg:96.2 F (35.7 C), Min:96 F (35.6 C), Max:96.3 F (35.7 C)  Recent Labs  Lab 04/05/22 0048 04/05/22 0049 04/05/22 0056 04/05/22 0256  WBC 7.6  --   --   --   CREATININE 2.09*  --  2.10*  --   LATICACIDVEN  --  2.5*  --  2.5*    CrCl cannot be calculated (Unknown ideal weight.).    Allergies  Allergen Reactions   Penicillins Other (See Comments)    Unknown reaction - Tolerated keflex and ceftriaxone while hospitalized 09/2020     Caryl Pina 04/05/2022 7:50 AM

## 2022-04-05 NOTE — Assessment & Plan Note (Signed)
   Urinalysis highly suggestive of urinary tract infection  UTI complicated by concurrent acute kidney injury  Abdomen distended and firm on exam  Treating with broad-spectrum intravenous antibiotic therapy for now which will be narrowed as cultures result  Obtaining CT imaging of the abdomen considering concurrent acute kidney injury and diffuse firm abdomen on exam.  This should evaluate for any evidence of hydronephrosis, stone or other source of infection  Hydrating with intravenous isotonic fluids

## 2022-04-05 NOTE — Assessment & Plan Note (Signed)
   Slightly elevated serial troponins with flat trajectory of elevation  Likely secondary to underlying illness, plaque rupture is unlikely  Monitoring patient on telemetry

## 2022-04-05 NOTE — Assessment & Plan Note (Signed)
   Lactic acidosis likely secondary to sepsis with concurrent volume depletion  Lactate normalized with hydration

## 2022-04-05 NOTE — ED Notes (Signed)
Pt sent to MRI 

## 2022-04-05 NOTE — ED Triage Notes (Signed)
Pt arrived from [place] by EMS for unresponsiveness. EMS noted pinpoint pupils, given 0.5mg  ativan with no improvement. Noted bp to be 87/86 then 767 systolic, pulse 44

## 2022-04-05 NOTE — Progress Notes (Signed)
Pt unavailable for EEG.  Going to CT.

## 2022-04-05 NOTE — ED Notes (Signed)
In and out cath completed with ~49ml of urine

## 2022-04-05 NOTE — Assessment & Plan Note (Addendum)
   Patient presenting to the hospital via EMS completely unresponsive which has improved somewhat throughout the ED course with patient not responding to pain  Patient undergoing extensive work-up for various causes of encephalopathy  Thus far, patient has been found to be volume depleted due to profound hypotension on arrival with a urinary tract infection and evidence of anoxic brain injury  Therefore patient's current degree of profound encephalopathy is likely multifactorial  Prognosis guarded  Treating underlying urinary tract infection with intravenous antibiotic  Hydrating patient with intravenous isotonic fluids for management of volume depletion  Supportive care for anoxic brain injury, appreciate neurology's recommendations for management of this  Remains mildly confused this AM but pleasant

## 2022-04-05 NOTE — Progress Notes (Signed)
Patient is awake and alert now with clear speech, follows simple command. Tried yale swallow screen, patient stops twice while drinking, no cough noted.

## 2022-04-05 NOTE — Assessment & Plan Note (Signed)
   Due to tenuous oral intake, holding basal insulin therapy for now  Scheduled Accu-Cheks every 6 hours with sliding scale insulin  Pt reportedly failed bedside swallow. Consulting SLP  Cont SSI for now

## 2022-04-05 NOTE — Progress Notes (Addendum)
PROGRESS NOTE    Holly Valdez  DJS:[id number] DOB: April 27, 1936 DOA: 04/05/2022 PCP: Lucianne Lei, MD   Chief Complaint  Patient presents with   Altered Mental Status    Brief Narrative:    This is a no charge charge note as patient was seen and admitted earlier today by Dr. Cyd Silence, patient was seen and examined, chart, imaging, labs were reviewed.  86 year old female with past medical history of non-insulin-dependent diabetes mellitus type 2, chronic kidney disease stage IIIb, hyperlipidemia, dementia, chronic low back pain who presents from [place] facility via EMS for unresponsiveness hypotension and bradycardia.   History is limited as patient is currently nonverbal and minimally responsive.  Furthermore, we have been unable to contact anyone at the patient's facility to gather further details as to what transpired yesterday evening.  According to EMS, was at her baseline at approximately 6 PM yesterday.  Patient was found to be unresponsive later in the evening and EMS was contacted who promptly came to evaluate the patient and noted that she was bradycardic with a heart rate of 44 and hypotensive with initial blood pressure of 50/38.  Intravenous fluids were initiated and patient was brought into [facility] emergency department for evaluation.   Upon arrival to the emergency department patient was initially administered a dose of Narcan with minimal effect.  Initial concern for sepsis prompted further administration of a 1 L normal saline bolus followed by initiation of Levophed due to hypotension with systolic blood pressures in the 50s and 60s.  Patient was administered broad-spectrum intravenous antibiotics with a dose of cefepime, Flagyl and vancomycin.  EDP discussed case with Dr. Curly Shores with neurology who graciously came to see the patient in consultation.  The hospitalist group was additionally called to assess the patient for admission in the  hospital.  Assessment & Plan:   Principal Problem:   Acute metabolic encephalopathy Active Problems:   Complicated UTI (urinary tract infection)   Acute renal failure superimposed on stage 3b chronic kidney disease (HCC)   Anoxic brain injury (HCC)   Lactic acidosis   Elevated troponin level not due myocardial infarction   Type 2 diabetes mellitus with stage 3b chronic kidney disease, with long-term current use of insulin (HCC)   Acute metabolic encephalopathy Patient presenting to the hospital via EMS completely unresponsive which has improved somewhat throughout the ED course with patient only responding to pain, responding to loud verbal stimuli, but remains somnolent and minimally interactive patient undergoing extensive work-up for various causes of encephalopathy Thus far, patient has been found to be volume depleted due to profound hypotension on arrival with a urinary tract infection and evidence of anoxic brain injury Therefore patient's current degree of profound encephalopathy is likely multifactorial Prognosis guarded Treating underlying urinary tract infection with intravenous antibiotic Hydrating patient with intravenous isotonic fluids for management of volume depletion Supportive care for anoxic brain injury, appreciate neurology's recommendations for management of this Monitoring closely for patient's response to medical therapy Will add IV thiamine daily.   Complicated UTI (urinary tract infection) Urinalysis highly suggestive of urinary tract infection UTI complicated by concurrent acute kidney injury Abdomen distended and firm on exam Treating with broad-spectrum intravenous antibiotic therapy for now which will be narrowed as cultures result Obtaining CT imaging of the abdomen considering concurrent acute kidney injury and diffuse firm abdomen on exam.  This should evaluate for any evidence of hydronephrosis, stone or other source of infection Hydrating with  intravenous isotonic fluids  Acute renal failure superimposed on stage 3b chronic kidney disease ([place]) Patient exhibiting evidence of acute kidney injury secondary to volume depletion and complicated urinary tract function Creatinine is currently 2.09 an increase compared to baseline of 1.4 Hydrating patient with intravenous isotonic fluids. Strict input and output monitoring Monitoring renal function and electrolytes with serial chemistries Avoiding nephrotoxic agents if at all possible If renal function fails to rapidly improve will consider expanding work-up with urine electrolytes     Anoxic brain injury [facility]) Surprising finding of global anoxic insult on MRI brain Brings about questions concerning the time course of patient's current illness No clinical evidence of significant hypoxia here and therefore there is reasonable concern for carbon monoxide poisoning Appreciate neurology's advice and working patient up for carbon monoxide, work-up pending Supportive care otherwise, appreciate neurology recommendations Supplemental oxygen as necessary Prognosis guarded, once we are able to contact the family goal of care must be discussed   Lactic acidosis Lactic acidosis likely secondary to sepsis with concurrent volume depletion Hydrate patient intravenous isotonic fluids while concurrently treating underlying infection Monitoring serial lactic acid levels to ensure downtrending and resolution.     Elevated troponin level not due myocardial infarction Slightly elevated serial troponins with flat trajectory of elevation Likely secondary to underlying illness, plaque rupture is unlikely Monitoring patient on telemetry     Type 2 diabetes mellitus with stage 3b chronic kidney disease, with long-term current use of insulin (HCC) Due to tenuous oral intake, holding basal insulin therapy for now Scheduled Accu-Cheks every 6 hours with sliding scale insulin Obtaining hemoglobin A1c We  will titrate regimen as necessary to achieve glycemic control  Urinary Retention -CT abdomen pelvis with significant distended bladder, will insert Foley catheter   Goals of care -I have discussed with daughter, patient at baseline wheelchair dependent, with advanced dementia, but she is able to feed herself at baseline, I have explained for her MRI showing evidence of anoxic brain injury, plan is to continue with current measures, IV fluids, IV antibiotics, no heroics, I have discussed with her that diagnosis is guarded, especially with evidence of anoxic brain injury on imaging, she was interested in getting palliative medicine involved to address goals of care if she is not improving, as she does not want her mother to suffer.  .   DVT prophylaxis: Lovenox Code Status: DNR Family Communication: D/W daughter by phone, she requested to be updated by phone daily, and I told her we will be able to do so. Disposition:   Status is: Inpatient Remains inpatient appropriate because: remians altered,    Consultants:  Neurology   Subjective:  Patient cannot provide any complaints on history, no significant events overnight as discussed with staff  Objective: Vitals:   04/05/22 0830 04/05/22 0914 04/05/22 1148 04/05/22 1524  BP: (!) 160/69 (!) 153/83 132/73 (!) 147/68  Pulse: 83 81 65 72  Resp: 18 18 14 20   Temp:  98 F (36.7 C) 97.7 F (36.5 C) 98.8 F (37.1 C)  TempSrc:  Oral Oral Oral  SpO2: 100% 100% 100% 100%  Weight:  75.8 kg      Intake/Output Summary (Last 24 hours) at 04/05/2022 1653 Last data filed at 04/05/2022 0840 Gross per 24 hour  Intake 2100 ml  Output 600 ml  Net 1500 ml   Filed Weights   04/05/22 0914  Weight: 75.8 kg    Examination:  Lethargic, open eyes and mumbles to verbal stimuli, sometimes answer her names but go back to  sleep, otherwise not interactive, does not follow any commands or answer any questions Symmetrical Chest wall movement, Good air  movement bilaterally, CTAB RRR,No Gallops,Rubs or new Murmurs, No Parasternal Heave +ve B.Sounds, Abd Soft, No tenderness, No rebound - guarding or rigidity. No Cyanosis, Clubbing or edema, No new Rash or bruise       Data Reviewed: I have personally reviewed following labs and imaging studies  CBC: Recent Labs  Lab 04/05/22 0048 04/05/22 0056 04/05/22 0057  WBC 7.6  --   --   NEUTROABS 5.4  --   --   HGB 9.7* 10.2* 10.2*  HCT 30.6* 30.0* 30.0*  MCV 89.5  --   --   PLT 225  --   --     Basic Metabolic Panel: Recent Labs  Lab 04/05/22 0048 04/05/22 0056 04/05/22 0057  NA 135 135 136  K 4.2 4.2 4.2  CL 106 103  --   CO2 19*  --   --   GLUCOSE 240* 254*  --   BUN 31* 30*  --   CREATININE 2.09* 2.10*  --   CALCIUM 8.6*  --   --     GFR: CrCl cannot be calculated (Unknown ideal weight.).  Liver Function Tests: Recent Labs  Lab 04/05/22 0048  AST 22  ALT 17  ALKPHOS 56  BILITOT 0.4  PROT 5.9*  ALBUMIN 3.0*    CBG: Recent Labs  Lab 04/05/22 0935 04/05/22 1144 04/05/22 1607  GLUCAP 196* 166* 85     Recent Results (from the past 240 hour(s))  Culture, blood (routine x 2)     Status: None (Preliminary result)   Collection Time: 04/05/22 12:48 AM   Specimen: BLOOD  Result Value Ref Range Status   Specimen Description BLOOD LEFT ANTECUBITAL  Final   Special Requests   Final    BOTTLES DRAWN AEROBIC AND ANAEROBIC Blood Culture results may not be optimal due to an inadequate volume of blood received in culture bottles   Culture   Final    NO GROWTH < 12 HOURS Performed at [facility] [address], [place], [place] [address]    Report Status PENDING  Incomplete  Culture, blood (routine x 2)     Status: None (Preliminary result)   Collection Time: 04/05/22 12:55 AM   Specimen: BLOOD  Result Value Ref Range Status   Specimen Description BLOOD SITE NOT SPECIFIED  Final   Special Requests   Final    BOTTLES DRAWN AEROBIC AND ANAEROBIC Blood  Culture results may not be optimal due to an inadequate volume of blood received in culture bottles   Culture   Final    NO GROWTH < 12 HOURS Performed at [facility], [place] [address] [place], [place] [address]    Report Status PENDING  Incomplete         Radiology Studies: CT ABDOMEN PELVIS WO CONTRAST  Result Date: 04/05/2022 CLINICAL DATA:  UTI, recurrent/complicated abdominal mass on examination. EXAM: CT ABDOMEN AND PELVIS WITHOUT CONTRAST TECHNIQUE: Multidetector CT imaging of the abdomen and pelvis was performed following the standard protocol without IV contrast. RADIATION DOSE REDUCTION: This exam was performed according to the departmental dose-optimization program which includes automated exposure control, adjustment of the mA and/or kV according to patient size and/or use of iterative reconstruction technique. COMPARISON:  None Available. FINDINGS: Lower chest: Large hiatal hernia with adjacent atelectasis. Linear atelectasis in the right lung base. Coronary artery calcifications. Calcifications of the  aortic valve. Hepatobiliary: Unremarkable noncontrast appearance of the hepatic parenchyma. Layering sludge and/or cholelithiasis without evidence of acute cholecystitis. No biliary ductal dilation. Pancreas: No pancreatic ductal dilation or evidence of acute inflammation. Spleen: No splenomegaly or focal splenic lesion. Adrenals/Urinary Tract: Bilateral adrenal glands are within normal limits. No hydronephrosis. Vascular calcifications in the bilateral renal hila. No renal, ureteral or bladder calculi identified. Left upper pole renal sinus cysts which are benign and require no follow-up. Urinary bladder is distended with urine, extending above the sacral promontory. Stomach/Bowel: Large hiatal hernia. Stomach is unremarkable for degree of distension. No pathologic dilation of large or small bowel. Colonic diverticulosis without findings of acute diverticulitis. Moderate rectal stool  burden with minimal wall thickening adjacent perirectal fat stranding. Vascular/Lymphatic: Extensive aortic and branch vessel atherosclerosis without abdominal aortic aneurysm. No pathologically enlarged abdominal lymph nodes. Reproductive: Probable prior hysterectomy. Hyperdense nodular area along the left adnexa measures approximally 3.7 cm on image 58/3 possibly reflecting bowel or a pelvic/adnexal lesion. Other: No significant abdominopelvic free fluid. Musculoskeletal: Prior L4-L5 posterior spinal fusion with advanced multilevel degenerative changes spine. IMPRESSION: 1. Urinary bladder is distended with urine, extending above the sacral promontory. Correlate for urinary retention. 2. No hydronephrosis. 3. Hyperdense nodular area along the left adnexa measures approximally 3.7 cm possibly reflecting bowel or a pelvic/adnexal lesion but incompletely evaluated on this noncontrast examination. Consider further evaluation with pelvic ultrasound. 4. Large hiatal hernia. 5. Colonic diverticulosis without findings of acute diverticulitis. 6. Layering sludge and/or cholelithiasis without evidence of acute cholecystitis. 7.  Aortic Atherosclerosis (ICD10-I70.0). Electronically Signed   By: Dahlia Bailiff M.D.   On: 04/05/2022 10:19   CT Head Wo Contrast  Result Date: 04/05/2022 CLINICAL DATA:  Mental status change, unknown cause EXAM: CT HEAD WITHOUT CONTRAST TECHNIQUE: Contiguous axial images were obtained from the base of the skull through the vertex without intravenous contrast. RADIATION DOSE REDUCTION: This exam was performed according to the departmental dose-optimization program which includes automated exposure control, adjustment of the mA and/or kV according to patient size and/or use of iterative reconstruction technique. COMPARISON:  None Available. FINDINGS: Brain: Normal anatomic configuration. Parenchymal volume loss is commensurate with the patient's age. Moderate subcortical and periventricular  white matter changes are present likely reflecting the sequela of small vessel ischemia. Tiny remote lacunar infarct noted within the right cerebellar hemisphere. No abnormal intra or extra-axial mass lesion or fluid collection. No abnormal mass effect or midline shift. No evidence of acute intracranial hemorrhage or infarct. Ventricular size is normal. Cerebellum unremarkable. Vascular: No asymmetric hyperdense vasculature at the skull base. Skull: Intact Sinuses/Orbits: Paranasal sinuses are clear. Ocular lenses have been removed. Orbits are otherwise unremarkable. Other: Mastoid air cells and middle ear cavities are clear. IMPRESSION: No acute intracranial abnormality.  No calvarial fracture. Electronically Signed   By: Fidela Salisbury M.D.   On: 04/05/2022 01:22   MR BRAIN WO CONTRAST  Result Date: 04/05/2022 CLINICAL DATA:  Delirium.  Unresponsive with pinpoint pupils. EXAM: MRI HEAD WITHOUT CONTRAST TECHNIQUE: Multiplanar, multiecho pulse sequences of the brain and surrounding structures were obtained without intravenous contrast. COMPARISON:  Head CT from earlier today FINDINGS: Brain: Linear increased diffusion signal along the bilateral parietal cortex and faintly seen in the bilateral globus pallidus. No additional areas of edematous cortex to imply background posterior reversible encephalopathy syndrome. There is a background of advanced chronic small vessel ischemia with confluent gliosis in the deep white matter. Small remote right cerebellar infarct. Cerebral volume loss in keeping with age.  No hemorrhage, hydrocephalus, or masslike finding Vascular: Major flow voids are preserved. Skull and upper cervical spine: Cervical spine degeneration where covered. No focal marrow lesion. Sinuses/Orbits: Bilateral cataract resection. IMPRESSION: 1. Symmetric restricted diffusion in the parietal cortex and basal ganglia suggesting global anoxic insult. 2. Extensive chronic small vessel ischemia. Electronically  Signed   By: Jorje Guild M.D.   On: 04/05/2022 06:14   DG Chest Port 1 View  Result Date: 04/05/2022 CLINICAL DATA:  Altered mental status. EXAM: PORTABLE CHEST 1 VIEW COMPARISON:  None Available. FINDINGS: Mild cardiomegaly with mild central vascular congestion. No focal consolidation, pleural effusion, pneumothorax. There is moderate size hiatal hernia. Atherosclerotic calcification of the aorta. Degenerative changes of spine. No acute osseous pathology. IMPRESSION: Mild cardiomegaly with mild central vascular congestion. Electronically Signed   By: Anner Crete M.D.   On: 04/05/2022 02:00   DG Abd Portable 1V  Result Date: 04/05/2022 CLINICAL DATA:  MRI clearance. EXAM: PORTABLE ABDOMEN - 1 VIEW COMPARISON:  None Available. FINDINGS: Posterior spinal fusion hardware noted at L4-5. Linear metallic foreign body overlies the L3-4 interspace. Bowel gas pattern is unremarkable. Bones are diffusely demineralized. Telemetry leads overlie the lower chest and upper abdomen. IMPRESSION: Posterior spinal fusion hardware at L4-5. Additional linear metallic foreign body at the level of the L3-4 interspace indeterminate on frontal projection, possibly a surgical clip. Lateral lumbar spine x-ray could be used to further evaluate as clinically warranted. Electronically Signed   By: Misty Stanley M.D.   On: 04/05/2022 05:26   EEG adult  Result Date: 04/05/2022 Lora Havens, MD     04/05/2022  1:45 PM Patient Name: KALEESI GUYTON MRN: [id number] Epilepsy Attending: Lora Havens Referring Physician/Provider: Lorenza Chick, MD Date: 04/05/2022 Duration: 22.31 mins Patient history: 86 year old woman with a past medical history of dementia and vascular risk factors as above presenting with altered mental status. EEG to evaluate for seizure Level of alertness: Awake AEDs during EEG study: None Technical aspects: This EEG study was done with scalp electrodes positioned according to the 10-20 International  system of electrode placement. Electrical activity was acquired at a sampling rate of 500Hz  and reviewed with a high frequency filter of 70Hz  and a low frequency filter of 1Hz . EEG data were recorded continuously and digitally stored. Description: No posterior dominant rhythm consists of 9-10 Hz activity of moderate voltage (25-35 uV) seen predominantly in posterior head regions, symmetric and reactive to eye opening and eye closing. EEG showed continuous generalized 3 to 6 Hz theta-delta slowing. Hyperventilation and photic stimulation were not performed.   ABNORMALITY - Continuous slow, generalized IMPRESSION: This study is suggestive of moderate diffuse encephalopathy, nonspecific etiology. No seizures or epileptiform discharges were seen throughout the recording. Priyanka Barbra Sarks        Scheduled Meds:  [START ON 04/06/2022] enoxaparin (LOVENOX) injection  30 mg Subcutaneous Q24H   insulin aspart  0-15 Units Subcutaneous Q6H   Continuous Infusions:  cefTRIAXone (ROCEPHIN)  IV     lactated ringers     thiamine injection     [START ON 04/07/2022] vancomycin       LOS: 0 days       Phillips Climes, MD [facility]   To contact the attending provider between 7A-7P or the covering provider during after hours 7P-7A, please log into the web site [email or web address] and access using universal [place] password for that web site. If you do not have the password, please call the hospital operator.  04/05/2022, 4:53 PM

## 2022-04-05 NOTE — ED Notes (Signed)
Pt alert to name, bladder scan completed, showing 579ml

## 2022-04-05 NOTE — Assessment & Plan Note (Signed)
   Surprising finding of global anoxic insult on MRI brain  Brings about questions concerning the time course of patient's current illness  No clinical evidence of significant hypoxia here and therefore there is reasonable concern for carbon monoxide poisoning  Appreciate neurology's advice and working patient up for carbon monoxide, work-up pending  Supportive care otherwise, appreciate neurology recommendations  Supplemental oxygen as necessary  Prognosis guarded, once we are able to contact the family goal of care must be discussed

## 2022-04-05 NOTE — Progress Notes (Signed)
Patient ID: Holly Valdez, female   DOB: July 17, 1936, 86 y.o.   MRN: [id number]  MRI brain personally reviewed, agree with radiology that there is symmetric restricted diffusion in the parietal cortex bilaterally as well as basal ganglia bilaterally. This can be seen in anoxic insult, although this was not reported by EMS and patient has had good oxygen saturations here A similar pattern can also be seen in carbon monoxide poisoning  Additional recommendations -Carboxyhemoglobin panel ordered, appreciate treatment per primary team Additional recommendations conveyed to Dr. Ayesha Rumpf via secure chat, will additionally attempt to notify Dr. Waldron [facility]  No charge same-day progress note

## 2022-04-05 NOTE — Progress Notes (Signed)
EEG complete - results pending 

## 2022-04-05 NOTE — ED Notes (Signed)
EMS gave 1mg  narcan per Dr.Rees; minimal response (pupils 3 reactive bilateral)

## 2022-04-05 NOTE — Consult Note (Signed)
Consultation Note Date: 04/05/2022   Patient Name: Holly Valdez  DOB: 09-Jun-1936  MRN: [id number]  Age / Sex: 86 y.o., female  PCP: Lucianne Lei, MD Referring Physician: Albertine Patricia, MD  Reason for Consultation: {Reason for Consult:23484}  HPI/Patient Profile: 86 y.o. female  with past medical history of *** admitted on 04/05/2022 with ***.    PMT has been consulted to assist with goals of care conversation.  Clinical Assessment and Goals of Care:  I have reviewed medical records including EPIC notes, labs and imaging, ***, assessed the patient and then *** with *** to discuss diagnosis prognosis, GOC, EOL wishes, disposition and options.  I introduced Palliative Medicine as specialized medical care for people living with serious illness. It focuses on providing relief from the symptoms and stress of a serious illness. The goal is to improve quality of life for both the patient and the family.  We discussed a brief life review of the patient and then focused on their current illness. ***  I attempted to elicit values and goals of care important to the patient.    Medical History Review and Understanding:  ***  Social History: ***  Functional and Nutritional State: ***  Palliative Symptoms: ***  Advance Directives: A detailed discussion regarding advanced directives was had. ***   Code Status: ***  Discussion: ***  The difference between aggressive medical intervention and comfort care was considered in light of the patient's goals of care. Hospice and Palliative Care services outpatient were explained and offered.   Discussed the importance of continued conversation with family and the medical providers regarding overall plan of care and treatment options, ensuring decisions are within the context of the patient's values and GOCs.   Questions and concerns were addressed.  Hard  Choices booklet left for review. The family was encouraged to call with questions or concerns.  PMT will continue to support holistically.   {Primary Decision PXTGG:26948}    SUMMARY OF RECOMMENDATIONS   *** Code Status/Advance Care Planning: {Palliative Code status:23503}   Symptom Management:  ***  Palliative Prophylaxis:  {Palliative Prophylaxis:21015}  Additional Recommendations (Limitations, Scope, Preferences): {Recommended Scope and Preferences:21019}  Psycho-social/Spiritual:  Desire for further Chaplaincy support:{YES NO:22349} Additional Recommendations: {PAL SOCIAL:21064}  Prognosis:  {Palliative Care Prognosis:23504}  Discharge Planning: {Palliative dispostion:23505}      Primary Diagnoses: Present on Admission:  Acute metabolic encephalopathy  Lactic acidosis  Acute renal failure superimposed on stage 3b chronic kidney disease ([place])  Complicated UTI (urinary tract infection)  Anoxic brain injury ([facility])  Elevated troponin level not due myocardial infarction    Physical Exam  Vital Signs: BP (!) 147/68 (BP Location: Right Arm)   Pulse 72   Temp 98.8 F (37.1 C) (Oral)   Resp 20   Wt 75.8 kg   SpO2 100%   BMI 30.54 kg/m  Pain Scale: 0-10   Pain Score: 0-No pain   SpO2: SpO2: 100 % O2 Device:SpO2: 100 % O2 Flow Rate: .O2 Flow Rate (L/min): 2 L/min   Palliative Assessment/Data:      Total time: I spent *** minutes in the care of the patient today in the above activities and documenting the encounter.   MDM:  Norberta Keens, PA-C  Palliative Medicine Team Team phone # [phone]  Thank you for allowing the Palliative Medicine Team to assist in the care of this patient. Please utilize secure chat with additional questions, if there is no response within 30 minutes please call the above phone  number.  Palliative Medicine Team providers are available by phone from 7am to 7pm daily and can be reached through the team cell  phone.  Should this patient require assistance outside of these hours, please call the patient's attending physician.

## 2022-04-05 NOTE — Consult Note (Signed)
Neurology Consultation Reason for Consult: AMS Requesting Physician: Quintella Reichert  CC: Altered mental status  History is obtained from: Chart review and EDP as well as medical staff at bedside  HPI: Holly Valdez "Cookie" is a 86 y.o. female with a past medical history significant for dementia (unclear baseline at this time), hypertension, hyperlipidemia, diabetes C/B neuropathy and retinopathy and nephropathy, CKD stage III,  She was brought in by EMS for unresponsiveness and at baseline is conversant though demented.  She reportedly last spoke to someone around 6 PM at her facility.  She was hypotensive with EMS and initially on ED arrival required some Levophed and was given a dose of vancomycin, cefepime and metronidazole, fluids, as well as Narcan for pinpoint pupils (0.5 mg in route with EMS, 1 mg in the ED).  Per Dr. Ralene Bathe on her initial arrival GCS was 3, and exam has improved slightly but she remains nonverbal.  Review of the chart in January 2022 she was brought in by family for assistance with skilled nursing facility placement secondary to worsening confusion, inability to perform ADLs.  At that time she was oriented to place and name and had trouble following simple commands but was spontaneously moving all 4 extremities   LKW: 6 PM on 5/22 tPA given?: No, out of the window Premorbid modified rankin scale: 4-5     4 - Moderately severe disability. Unable to attend to own bodily needs without assistance, and unable to walk unassisted.     5 - Severe disability. Requires constant nursing care and attention, bedridden, incontinent.  ROS: Unable to obtain due to altered mental status.   Past Medical History:  Diagnosis Date   Dementia ([place])    Diabetes mellitus without complication ([place])    Hypertension    History reviewed. No pertinent surgical history.  Current Outpatient Medications  Medication Instructions   atorvastatin (LIPITOR) 40 mg, Oral, Every evening    calcium carbonate (OSCAL) 1,500 mg, Oral, 2 times daily   Cholecalciferol 50 MCG (2000 UT) CAPS 1 capsule, Oral, Daily   Continuous Blood Gluc Receiver (FREESTYLE LIBRE 2 READER) DEVI 1 each, Does not apply   Continuous Blood Gluc Sensor (FREESTYLE LIBRE 2 SENSOR) MISC 1 each, Does not apply   diclofenac Sodium (VOLTAREN) 4 g, Topical, 4 times daily   DSS 100 mg, Oral, Daily PRN   DULoxetine (CYMBALTA) 60 MG capsule 1 capsule, Oral, Daily   hydrALAZINE (APRESOLINE) 25 mg, Oral, 3 times daily   insulin glargine (LANTUS) 15 Units, Subcutaneous, Daily   losartan (COZAAR) 100 mg, Oral, Daily     No family history on file.   Social History:  reports that she has quit smoking. She has never used smokeless tobacco. She reports that she does not drink alcohol and does not use drugs.  Exam: Current vital signs: BP 118/63   Pulse 82   Temp (!) 96 F (35.6 C) (Oral)   Resp 14   SpO2 98%  Vital signs in last 24 hours: Temp:  [96 F (35.6 C)] 96 F (35.6 C) (05/23 0040) Pulse Rate:  [63-82] 82 (05/23 0300) Resp:  [13-26] 14 (05/23 0300) BP: (52-135)/(34-84) 118/63 (05/23 0300) SpO2:  [92 %-100 %] 98 % (05/23 0300)   Physical Exam  Constitutional: Appears well-developed and well-nourished.  Psych: Minimally interactive Eyes: No scleral injection HENT: Resists movement of her neck and head MSK: no major joint deformities.  Cardiovascular: Normal rate and regular rhythm. Perfusing extremities well Respiratory: Effort normal, non-labored  breathing GI: Soft.  No distension. There is no tenderness.  Skin: Warm dry and intact visible skin  Neuro: Mental Status: Sleeping, and with repeated noxious stimulation answers "I don't know" when asked what her name is Cranial Nerves: II: Pupils are equal, round, and reactive to light, 30mm to 2 mm III,IV, VI: EOM difficult to assess, does not track examiner but also appears to have suppressed EOM V: Facial sensation is symmetric to light  eyelash brush VII: Facial movement is symmetric to grimace VIII: hearing is intact to voice Remainder unable to assess given mental status Sensory/motor: Paratonia.  Withdraws in all 4 extremities, moves the upper extremities more briskly than the lowers Deep Tendon Reflexes: 2+ and symmetric in the biceps and ankles Plantars: Toes are mute bilaterally Cerebellar: Unable to assess given mental status Gait:  Nonambulatory at baseline per prior notes  NIHSS total 19 Score breakdown: 2 points for being stuporous, 2 points for not answering questions, 2 points for not following commands, 2 points for left upper extremity weakness, 2 points for right upper extremity weakness, 3 points for left lower extremity weakness, 3 points for right lower extremity weakness, 2 points for severe aphasia, one-point for mild to moderate slurring of words Performed at 4:20 AM   CBC notable for new anemia since January 2022 (hemoglobin was 13.4, now 9.7) Lactate 2.5 Troponin 54 Possible AKI on CKD (creatinine 2.09, baseline of 15 months ago of 1.4; GFR decreased from 38 to 23) BUN increased from 22 to 31 Ammonia 11 Glucose 240  I have reviewed labs in epic and the results pertinent to this consultation are:   I have reviewed the images obtained:  Head CT personally reviewed, agree with radiology no acute intracranial process, age-related atrophy, microvascular disease  Chest x-ray negative  UA with moderate leukocytes few bacteria  Impression: 86 year old woman with a past medical history of dementia and vascular risk factors as above presenting with altered mental status in the setting of hypotension and acute kidney injury versus worsening of her CKD, slowly improving with excellent supportive care provided by ED.  At this time I have low concern for ongoing seizure activity given that she was more responsive for me than previously described this admission.  Seizure with postictal state is on the  differential although would typically be associated with hypertension instead of hypotension.  Her mental status may also reflect hypoperfusion given her initial low blood pressures which improved largely with fluid resuscitation.  Cannot rule out infectious process at this time, though I do not have a strong concern for meningitis given her gradual improvement over the time she has been in the ED.  Recommendations: -Narrow cefepime when able given this can exacerbate altered mental status particularly in the setting of CKD -MRI brain to rule out acute intracranial process, screening x-rays to clear patient given her mental status and inability to reach family -Routine EEG -Added on UDS to UA -Telemetry -Infectious work-up per ED/primary team -Management of other comorbidities per primary team -Additional history when able to reach family regarding her recent baseline, any other similar events etc. -Neurology will follow  [facility] [phone] Available 7 PM to 7 AM, outside of these hours please call Neurologist on call as listed on Amion.

## 2022-04-05 NOTE — Procedures (Signed)
Patient Name: Holly Valdez  MRN: [id number]  Epilepsy Attending: Lora Havens  Referring Physician/Provider: Lorenza Chick, MD Date: 04/05/2022 Duration: 22.31 mins  Patient history: 86 year old woman with a past medical history of dementia and vascular risk factors as above presenting with altered mental status. EEG to evaluate for seizure  Level of alertness: Awake  AEDs during EEG study: None  Technical aspects: This EEG study was done with scalp electrodes positioned according to the 10-20 International system of electrode placement. Electrical activity was acquired at a sampling rate of 500Hz  and reviewed with a high frequency filter of 70Hz  and a low frequency filter of 1Hz . EEG data were recorded continuously and digitally stored.   Description: No posterior dominant rhythm consists of 9-10 Hz activity of moderate voltage (25-35 uV) seen predominantly in posterior head regions, symmetric and reactive to eye opening and eye closing. EEG showed continuous generalized 3 to 6 Hz theta-delta slowing. Hyperventilation and photic stimulation were not performed.     ABNORMALITY - Continuous slow, generalized  IMPRESSION: This study is suggestive of moderate diffuse encephalopathy, nonspecific etiology. No seizures or epileptiform discharges were seen throughout the recording.  Mirna Sutcliffe Barbra Sarks

## 2022-04-05 NOTE — ED Provider Notes (Signed)
[facility] EMERGENCY DEPARTMENT Provider Note   CSN: [id number] Arrival date & time: 04/05/22  0033     History  Chief Complaint  Patient presents with   Altered Mental Status    Holly Valdez is a 86 y.o. female.  The history is provided by the EMS personnel and medical records.  Altered Mental Status Holly Valdez is a 86 y.o. female who presents to the Emergency Department complaining of AMS.  Level 5 caveat due to unresponsiveness.  History is provided by EMS.  EMS reports the patient is a resident at [place] due to history of dementia.  They were called out due to patient being unresponsive, last found to be at her baseline state of health around 6 PM.  EMS reports that staff state her baseline is wheelchair-bound but can speak.  EMS reports hypotension prior to ED arrival with blood pressure 50/38 with a heart rate of 44.  Her blood pressure improved to 110 prior to administration of fluids.  She was started on IV fluids prior to ED arrival.  She was treated with 0.5 mg of Narcan with no change in her status.    Home Medications Prior to Admission medications   Medication Sig Start Date End Date Taking? Authorizing Provider  atorvastatin (LIPITOR) 40 MG tablet Take 40 mg by mouth every evening. 07/28/16  Yes [provider]  calcium carbonate (OSCAL) 1500 (600 Ca) MG TABS tablet Take 1,500 mg by mouth 2 (two) times daily.   Yes [provider]  Cholecalciferol 50 MCG (2000 UT) CAPS Take 1 capsule by mouth daily.   Yes [provider]  Docusate Sodium (DSS) 100 MG CAPS Take 100 mg by mouth daily as needed (constipation). 06/03/20  Yes [provider]  DULoxetine HCl 30 MG CSDR Take 30 mg by mouth daily. 09/15/20  Yes [provider]  gabapentin (NEURONTIN) 300 MG capsule Take 300 mg by mouth 3 (three) times daily.   Yes [provider]  hydrALAZINE (APRESOLINE) 25 MG tablet Take 25 mg by mouth 3  (three) times daily. 01/10/19  Yes [provider]  insulin aspart (NOVOLOG) 100 UNIT/ML injection Inject into the skin See admin instructions. Sliding scale  70-150 0 units  151-200 2 units  201-250 4 units  251-300 6 units  301-400 8 units   Yes [provider]  insulin glargine (LANTUS) 100 UNIT/ML Solostar Pen Inject 15 Units into the skin daily. Patient taking differently: Inject 45 Units into the skin 2 (two) times daily. 10/15/20 04/05/22 Yes Iona Beard, MD  linagliptin (TRADJENTA) 5 MG TABS tablet Take 5 mg by mouth daily.   Yes [provider]  losartan (COZAAR) 100 MG tablet Take 100 mg by mouth daily. 02/13/18  Yes [provider]  melatonin 3 MG TABS tablet Take 3 mg by mouth at bedtime.   Yes [provider]  Nutritional Supplements (GLUCERNA 1.2 CAL PO) Take 1 tablet by mouth in the morning, at noon, in the evening, and at bedtime.   Yes [provider]  diclofenac Sodium (VOLTAREN) 1 % GEL Apply 4 g topically 4 (four) times daily. Patient not taking: Reported on 04/05/2022 03/10/21   Deno Etienne, DO      Allergies    Penicillins    Review of Systems   Review of Systems  Unable to perform ROS: Mental status change   Physical Exam Updated Vital Signs BP (!) 165/76 (BP Location: Right Arm)  Pulse 85   Temp (!) 96.3 F (35.7 C) (Temporal)   Resp 16   SpO2 99%  Physical Exam Vitals and nursing note reviewed.  Constitutional:      Appearance: She is well-developed.     Comments: Unresponsive  HENT:     Head: Normocephalic and atraumatic.  Cardiovascular:     Rate and Rhythm: Normal rate and regular rhythm.     Heart sounds: No murmur heard. Pulmonary:     Effort: Pulmonary effort is normal. No respiratory distress.     Breath sounds: Normal breath sounds.  Abdominal:     Palpations: Abdomen is soft.     Tenderness: There is no abdominal tenderness. There is no guarding or rebound.  Musculoskeletal:         General: No tenderness.     Comments: No significant lower extremity swelling or deformity  Skin:    General: Skin is warm and dry.  Neurological:     Comments: GCS 1-1-1.  Pupils pinpoint and reactive bilaterally  Psychiatric:     Comments: Unable to assess    ED Results / Procedures / Treatments   Labs (all labs ordered are listed, but only abnormal results are displayed) Labs Reviewed  COMPREHENSIVE METABOLIC PANEL - Abnormal; Notable for the following components:      Result Value   CO2 19 (*)    Glucose, Bld 240 (*)    BUN 31 (*)    Creatinine, Ser 2.09 (*)    Calcium 8.6 (*)    Total Protein 5.9 (*)    Albumin 3.0 (*)    GFR, Estimated 23 (*)    All other components within normal limits  CBC WITH DIFFERENTIAL/PLATELET - Abnormal; Notable for the following components:   RBC 3.42 (*)    Hemoglobin 9.7 (*)    HCT 30.6 (*)    All other components within normal limits  LACTIC ACID, PLASMA - Abnormal; Notable for the following components:   Lactic Acid, Venous 2.5 (*)    All other components within normal limits  LACTIC ACID, PLASMA - Abnormal; Notable for the following components:   Lactic Acid, Venous 2.5 (*)    All other components within normal limits  URINALYSIS, ROUTINE W REFLEX MICROSCOPIC - Abnormal; Notable for the following components:   APPearance CLOUDY (*)    Protein, ur 30 (*)    Leukocytes,Ua MODERATE (*)    All other components within normal limits  URINALYSIS, MICROSCOPIC (REFLEX) - Abnormal; Notable for the following components:   Bacteria, UA FEW (*)    All other components within normal limits  I-STAT VENOUS BLOOD GAS, ED - Abnormal; Notable for the following components:   pCO2, Ven 37.9 (*)    pO2, Ven 65 (*)    Calcium, Ion 1.12 (*)    HCT 30.0 (*)    Hemoglobin 10.2 (*)    All other components within normal limits  I-STAT CHEM 8, ED - Abnormal; Notable for the following components:   BUN 30 (*)    Creatinine, Ser 2.10 (*)    Glucose, Bld  254 (*)    Calcium, Ion 1.11 (*)    Hemoglobin 10.2 (*)    HCT 30.0 (*)    All other components within normal limits  TROPONIN I (HIGH SENSITIVITY) - Abnormal; Notable for the following components:   Troponin I (High Sensitivity) 54 (*)    All other components within normal limits  TROPONIN I (HIGH SENSITIVITY) - Abnormal; Notable for the following  components:   Troponin I (High Sensitivity) 47 (*)    All other components within normal limits  CULTURE, BLOOD (ROUTINE X 2)  CULTURE, BLOOD (ROUTINE X 2)  URINE CULTURE  AMMONIA  PROTIME-INR  RAPID URINE DRUG SCREEN, HOSP PERFORMED  FOLATE  VITAMIN B12  TSH  SEDIMENTATION RATE  COOXEMETRY PANEL  CBG MONITORING, ED    EKG EKG Interpretation  Date/Time:  Tuesday Apr 05 2022 00:35:24 EDT Ventricular Rate:  69 PR Interval:  159 QRS Duration: 85 QT Interval:  496 QTC Calculation: 532 R Axis:   12 Text Interpretation: Sinus rhythm Prolonged QT interval Confirmed by Quintella Reichert [phone]) on 04/05/2022 1:14:13 AM  Radiology CT Head Wo Contrast  Result Date: 04/05/2022 CLINICAL DATA:  Mental status change, unknown cause EXAM: CT HEAD WITHOUT CONTRAST TECHNIQUE: Contiguous axial images were obtained from the base of the skull through the vertex without intravenous contrast. RADIATION DOSE REDUCTION: This exam was performed according to the departmental dose-optimization program which includes automated exposure control, adjustment of the mA and/or kV according to patient size and/or use of iterative reconstruction technique. COMPARISON:  None Available. FINDINGS: Brain: Normal anatomic configuration. Parenchymal volume loss is commensurate with the patient's age. Moderate subcortical and periventricular white matter changes are present likely reflecting the sequela of small vessel ischemia. Tiny remote lacunar infarct noted within the right cerebellar hemisphere. No abnormal intra or extra-axial mass lesion or fluid collection. No abnormal  mass effect or midline shift. No evidence of acute intracranial hemorrhage or infarct. Ventricular size is normal. Cerebellum unremarkable. Vascular: No asymmetric hyperdense vasculature at the skull base. Skull: Intact Sinuses/Orbits: Paranasal sinuses are clear. Ocular lenses have been removed. Orbits are otherwise unremarkable. Other: Mastoid air cells and middle ear cavities are clear. IMPRESSION: No acute intracranial abnormality.  No calvarial fracture. Electronically Signed   By: Fidela Salisbury M.D.   On: 04/05/2022 01:22   MR BRAIN WO CONTRAST  Result Date: 04/05/2022 CLINICAL DATA:  Delirium.  Unresponsive with pinpoint pupils. EXAM: MRI HEAD WITHOUT CONTRAST TECHNIQUE: Multiplanar, multiecho pulse sequences of the brain and surrounding structures were obtained without intravenous contrast. COMPARISON:  Head CT from earlier today FINDINGS: Brain: Linear increased diffusion signal along the bilateral parietal cortex and faintly seen in the bilateral globus pallidus. No additional areas of edematous cortex to imply background posterior reversible encephalopathy syndrome. There is a background of advanced chronic small vessel ischemia with confluent gliosis in the deep white matter. Small remote right cerebellar infarct. Cerebral volume loss in keeping with age. No hemorrhage, hydrocephalus, or masslike finding Vascular: Major flow voids are preserved. Skull and upper cervical spine: Cervical spine degeneration where covered. No focal marrow lesion. Sinuses/Orbits: Bilateral cataract resection. IMPRESSION: 1. Symmetric restricted diffusion in the parietal cortex and basal ganglia suggesting global anoxic insult. 2. Extensive chronic small vessel ischemia. Electronically Signed   By: Jorje Guild M.D.   On: 04/05/2022 06:14   DG Chest Port 1 View  Result Date: 04/05/2022 CLINICAL DATA:  Altered mental status. EXAM: PORTABLE CHEST 1 VIEW COMPARISON:  None Available. FINDINGS: Mild cardiomegaly with  mild central vascular congestion. No focal consolidation, pleural effusion, pneumothorax. There is moderate size hiatal hernia. Atherosclerotic calcification of the aorta. Degenerative changes of spine. No acute osseous pathology. IMPRESSION: Mild cardiomegaly with mild central vascular congestion. Electronically Signed   By: Anner Crete M.D.   On: 04/05/2022 02:00   DG Abd Portable 1V  Result Date: 04/05/2022 CLINICAL DATA:  MRI clearance. EXAM: PORTABLE ABDOMEN -  1 VIEW COMPARISON:  None Available. FINDINGS: Posterior spinal fusion hardware noted at L4-5. Linear metallic foreign body overlies the L3-4 interspace. Bowel gas pattern is unremarkable. Bones are diffusely demineralized. Telemetry leads overlie the lower chest and upper abdomen. IMPRESSION: Posterior spinal fusion hardware at L4-5. Additional linear metallic foreign body at the level of the L3-4 interspace indeterminate on frontal projection, possibly a surgical clip. Lateral lumbar spine x-ray could be used to further evaluate as clinically warranted. Electronically Signed   By: Misty Stanley M.D.   On: 04/05/2022 05:26    Procedures Procedures   CRITICAL CARE Performed by: Quintella Reichert   Total critical care time: 60 minutes  Critical care time was exclusive of separately billable procedures and treating other patients.  Critical care was necessary to treat or prevent imminent or life-threatening deterioration.  Critical care was time spent personally by me on the following activities: development of treatment plan with patient and/or surrogate as well as nursing, discussions with consultants, evaluation of patient's response to treatment, examination of patient, obtaining history from patient or surrogate, ordering and performing treatments and interventions, ordering and review of laboratory studies, ordering and review of radiographic studies, pulse oximetry and re-evaluation of patient's condition.  Medications Ordered  in ED Medications  cefTRIAXone (ROCEPHIN) 2 g in sodium chloride 0.9 % 100 mL IVPB (has no administration in time range)  sodium chloride 0.9 % bolus 1,000 mL (0 mLs Intravenous Stopped 04/05/22 0225)  metroNIDAZOLE (FLAGYL) IVPB 500 mg (0 mg Intravenous Stopped 04/05/22 0232)  ceFEPIme (MAXIPIME) 2 g in sodium chloride 0.9 % 100 mL IVPB (0 g Intravenous Stopped 04/05/22 0204)  vancomycin (VANCOREADY) IVPB 1500 mg/300 mL (0 mg Intravenous Stopped 04/05/22 0449)  lactated ringers bolus 1,000 mL (1,000 mLs Intravenous New Bag/Given 04/05/22 0615)    ED Course/ Medical Decision Making/ A&P                           Medical Decision Making Amount and/or Complexity of Data Reviewed Labs: ordered. Radiology: ordered.  Risk Prescription drug management. Decision regarding hospitalization.   Patient here from [facility] for evaluation of altered mental status.  Patient is a GCS of 3 on ED presentation.  According to EMS and the [facility] for her facility she is DNR.  Facility did not have her advance directive or DNR paperwork to send with her.  On record review in epic patient has been DNR in the past.  Attempted to contact family, voicemail left-no answer on multiple attempts.  Unable to reach staff at nursing facility.  She was treated with IV fluid hydration, antibiotics for possible sepsis.  Patient did have recurrent hypotension during her ED stay, temporarily started on Levophed for blood pressure support.  This was able to be weaned off after 1 L fluid bolus.  She was treated with a 1 mg dose of Narcan upon ED arrival.  Pupils remained small but did increase from 1 mm to 3 mm bilaterally.  There was no additional significant change in her neurologic examination.  CT head is negative for acute abnormality.  Source of altered mental status remains unclear at this time.  No clear seizure activity during her ED stay.  On repeat assessments during her ED stay her mental status did slightly improve.   On repeat evaluation patient did respond to painful stimuli in all 4 extremities but mostly in her face with actively resisting eye opening and flinching.  Neurology consulted  for possible subclinical status.  Medicine consulted for admission for ongoing work-up for her altered mental status.        Final Clinical Impression(s) / ED Diagnoses Final diagnoses:  AKI (acute kidney injury) ([place])  Glasgow coma scale total score 3-8, at arrival to emergency department [facility])    Rx / [place] Orders ED Discharge Orders     None         Quintella Reichert, MD 04/05/22 [phone]

## 2022-04-05 NOTE — Sepsis Progress Note (Signed)
Elink following code sepsis °

## 2022-04-06 DIAGNOSIS — G9341 Metabolic encephalopathy: Secondary | ICD-10-CM | POA: Diagnosis not present

## 2022-04-06 DIAGNOSIS — N390 Urinary tract infection, site not specified: Secondary | ICD-10-CM | POA: Diagnosis not present

## 2022-04-06 LAB — CBC WITH DIFFERENTIAL/PLATELET
Abs Immature Granulocytes: 0.04 10*3/uL (ref 0.00–0.07)
Basophils Absolute: 0 10*3/uL (ref 0.0–0.1)
Basophils Relative: 0 %
Eosinophils Absolute: 0.1 10*3/uL (ref 0.0–0.5)
Eosinophils Relative: 1 %
HCT: 29.5 % — ABNORMAL LOW (ref 36.0–46.0)
Hemoglobin: 9.6 g/dL — ABNORMAL LOW (ref 12.0–15.0)
Immature Granulocytes: 1 %
Lymphocytes Relative: 23 %
Lymphs Abs: 2 10*3/uL (ref 0.7–4.0)
MCH: 28.3 pg (ref 26.0–34.0)
MCHC: 32.5 g/dL (ref 30.0–36.0)
MCV: 87 fL (ref 80.0–100.0)
Monocytes Absolute: 0.6 10*3/uL (ref 0.1–1.0)
Monocytes Relative: 7 %
Neutro Abs: 6.1 10*3/uL (ref 1.7–7.7)
Neutrophils Relative %: 68 %
Platelets: 247 10*3/uL (ref 150–400)
RBC: 3.39 MIL/uL — ABNORMAL LOW (ref 3.87–5.11)
RDW: 14.4 % (ref 11.5–15.5)
WBC: 8.8 10*3/uL (ref 4.0–10.5)
nRBC: 0 % (ref 0.0–0.2)

## 2022-04-06 LAB — GLUCOSE, CAPILLARY
Glucose-Capillary: 83 mg/dL (ref 70–99)
Glucose-Capillary: 99 mg/dL (ref 70–99)
Glucose-Capillary: 86 mg/dL (ref 70–99)
Glucose-Capillary: 86 mg/dL (ref 70–99)

## 2022-04-06 LAB — LACTIC ACID, PLASMA
Lactic Acid, Venous: 0.7 mmol/L (ref 0.5–1.9)
Lactic Acid, Venous: 0.8 mmol/L (ref 0.5–1.9)

## 2022-04-06 LAB — COMPREHENSIVE METABOLIC PANEL
ALT: 27 U/L (ref 0–44)
AST: 76 U/L — ABNORMAL HIGH (ref 15–41)
Albumin: 2.9 g/dL — ABNORMAL LOW (ref 3.5–5.0)
Alkaline Phosphatase: 50 U/L (ref 38–126)
Anion gap: 5 (ref 5–15)
BUN: 21 mg/dL (ref 8–23)
CO2: 22 mmol/L (ref 22–32)
Calcium: 8.7 mg/dL — ABNORMAL LOW (ref 8.9–10.3)
Chloride: 113 mmol/L — ABNORMAL HIGH (ref 98–111)
Creatinine, Ser: 1.5 mg/dL — ABNORMAL HIGH (ref 0.44–1.00)
GFR, Estimated: 34 mL/min — ABNORMAL LOW (ref >60)
Glucose, Bld: 78 mg/dL (ref 70–99)
Potassium: 4.1 mmol/L (ref 3.5–5.1)
Sodium: 140 mmol/L (ref 135–145)
Total Bilirubin: 0.5 mg/dL (ref 0.3–1.2)
Total Protein: 5.8 g/dL — ABNORMAL LOW (ref 6.5–8.1)

## 2022-04-06 LAB — HEMOGLOBIN A1C
Hgb A1c MFr Bld: 9.9 % — ABNORMAL HIGH (ref 4.8–5.6)
Mean Plasma Glucose: 237.43 mg/dL

## 2022-04-06 LAB — URINE CULTURE: Culture: 80000 — AB

## 2022-04-06 LAB — MAGNESIUM: Magnesium: 2 mg/dL (ref 1.7–2.4)

## 2022-04-06 MED ORDER — INSULIN ASPART 100 UNIT/ML IJ SOLN
0.0000 [IU] | INTRAMUSCULAR | Status: DC
  Administered 2022-04-07 (×2): 5 [IU] via SUBCUTANEOUS
  Administered 2022-04-07: 2 [IU] via SUBCUTANEOUS

## 2022-04-06 MED ORDER — CHLORHEXIDINE GLUCONATE CLOTH 2 % EX PADS: 6.0000 | MEDICATED_PAD | Freq: Every day | CUTANEOUS | Status: DC

## 2022-04-06 NOTE — Hospital Course (Addendum)
86 year old female with past medical history of non-insulin-dependent diabetes mellitus type 2, chronic kidney disease stage IIIb, hyperlipidemia, dementia, chronic low back pain presented from [facility] facility via EMS for unresponsiveness hypotension and bradycardia. History is limited as p- she was  nonverbal and minimally responsive, unable to contact anyone on admission. For EMS- she was bradycardic HR-44 and hypotensive 50/38 InED-S/P Narcan with minimal effect.Initial concern for sepsis   given intravenous antibiotics. She was admitted and neuro was consulted. Concern for multifactorial encephalopathy with surprising finding of global anoxic injury and MRI brain. Followed by neurology, work-up significant for UTI.  Urine culture grew Aerococcus susceptibility not done. Seen by speech advise regular thin liquid with mild aspiration risk.  Mental status has returned to baseline patient was seen by palliative care most from completed, DNR.  At this time patient is stable for discharge back to skilled nursing facility on oral antibiotics and have outpatient follow-up with palliative care.

## 2022-04-06 NOTE — Progress Notes (Signed)
Progress Note   Patient: Holly Valdez:[id number] DOB: May 14, 1936 DOA: 04/05/2022     1 DOS: the patient was seen and examined on 04/06/2022   Brief hospital course: 86 year old female with past medical history of non-insulin-dependent diabetes mellitus type 2, chronic kidney disease stage IIIb, hyperlipidemia, dementia, chronic low back pain who presents from [facility] facility via EMS for unresponsiveness hypotension and bradycardia.   History is limited as patient is currently nonverbal and minimally responsive.  Furthermore, we have been unable to contact anyone at the patient's facility to gather further details as to what transpired yesterday evening.  According to EMS, was at her baseline at approximately 6 PM yesterday.  Patient was found to be unresponsive later in the evening and EMS was contacted who promptly came to evaluate the patient and noted that she was bradycardic with a heart rate of 44 and hypotensive with initial blood pressure of 50/38.  Intravenous fluids were initiated and patient was brought into [facility] emergency department for evaluation.   Upon arrival to the emergency department patient was initially administered a dose of Narcan with minimal effect.  Initial concern for sepsis prompted further administration of a 1 L normal saline bolus followed by initiation of Levophed due to hypotension with systolic blood pressures in the 50s and 60s.  Patient was administered broad-spectrum intravenous antibiotics with a dose of cefepime, Flagyl and vancomycin.  EDP discussed case with Dr. Curly Shores with neurology who graciously came to see the patient in consultation.  The hospitalist group was additionally called to assess the patient for admission in the hospital.  Assessment and Plan: * Acute metabolic encephalopathy Patient presenting to the hospital via EMS completely unresponsive which has improved somewhat throughout the ED course with  patient not responding to pain Patient undergoing extensive work-up for various causes of encephalopathy Thus far, patient has been found to be volume depleted due to profound hypotension on arrival with a urinary tract infection and evidence of anoxic brain injury Therefore patient's current degree of profound encephalopathy is likely multifactorial Prognosis guarded Treating underlying urinary tract infection with intravenous antibiotic Hydrating patient with intravenous isotonic fluids for management of volume depletion Supportive care for anoxic brain injury, appreciate neurology's recommendations for management of this Remains mildly confused this AM but pleasant  Complicated UTI (urinary tract infection) Urinalysis highly suggestive of urinary tract infection UTI complicated by concurrent acute kidney injury Abdomen distended and firm on exam Had been treating with broad-spectrum intravenous antibiotic therapy Reviewed CT abd from 5/23, findings suggestive of bladder outlet obstruction. Has been receiving I/O cath  Acute renal failure superimposed on stage 3b chronic kidney disease ([place]) Patient exhibiting evidence of acute kidney injury secondary to volume depletion and complicated urinary tract function Creatinine is currently 2.09 an increase compared to baseline of 1.4 Hydrating patient with intravenous isotonic fluids. Strict input and output monitoring Monitoring renal function and electrolytes with serial chemistries Avoiding nephrotoxic agents if at all possible Cr improving Repeat bmet in AM   Anoxic brain injury ([place]) Incidential finding of global anoxic insult on MRI brain Brings about questions concerning the time course of patient's current illness No clinical evidence of significant hypoxia here and therefore there is reasonable concern for carbon monoxide poisoning Appreciate neurology's advice and working patient up for carbon monoxide work up  unremarkable Supportive care otherwise, appreciate neurology recommendations Supplemental oxygen as necessary Prognosis guarded, would benefit from Goals of Care  Lactic acidosis Lactic acidosis likely secondary  to sepsis with concurrent volume depletion Lactate normalized with hydration   Elevated troponin level not due myocardial infarction Slightly elevated serial troponins with flat trajectory of elevation Likely secondary to underlying illness, plaque rupture is unlikely Appears stable at this time   Type 2 diabetes mellitus with stage 3b chronic kidney disease, with long-term current use of insulin (HCC) Due to tenuous oral intake, holding basal insulin therapy for now Scheduled Accu-Cheks every 6 hours with sliding scale insulin Pt reportedly failed bedside swallow. Consulting SLP Cont SSI for now        Subjective: Pleasantly confused, in good spirits, stating "i'm smiling all the time"  Physical Exam: Vitals:   04/06/22 0800 04/06/22 0829 04/06/22 1129 04/06/22 1200  BP:  (!) 131/51 120/61   Pulse:  61 67   Resp:  16 16   Temp:  99.1 F (37.3 C) 98.8 F (37.1 C)   TempSrc:  Oral Oral   SpO2: 96% 96% 96% 96%  Weight:       General exam: Awake, laying in bed, in nad Respiratory system: Normal respiratory effort, no wheezing Cardiovascular system: regular rate, s1, s2 Gastrointestinal system: Soft, nondistended, positive BS Central nervous system: CN2-12 grossly intact, strength intact Extremities: Perfused, no clubbing Skin: Normal skin turgor, no notable skin lesions seen Psychiatry: Mood normal // no visual hallucinations   Data Reviewed:  Labs reviewed: Cr 1.5, Hgb 9.6  Family Communication: Pt in room, family not at bedside  Disposition: Status is: Inpatient Remains inpatient appropriate because: Severity of illness  Planned Discharge Destination: Skilled nursing facility     Author: Marylu Lund, MD 04/06/2022 2:31 PM  For on call review  [email or web address].

## 2022-04-06 NOTE — Plan of Care (Signed)
Patient is more awake now and able to have conversation, follow simple commands. Alert and oriented to her self and place. Still receiving antibiotic for her UTI. FC was inserted due to urinary retention. Failed swallow test as this time, she stops twice while drinking water although no coughing or choking noted. Kept comfortable. Will continue to monitor.    Problem: Fluid Volume: Goal: Hemodynamic stability will improve Outcome: Progressing   Problem: Clinical Measurements: Goal: Diagnostic test results will improve Outcome: Progressing   Problem: Health Behavior/Discharge Planning: Goal: Ability to manage health-related needs will improve Outcome: Progressing   Problem: Clinical Measurements: Goal: Ability to maintain clinical measurements within normal limits will improve Outcome: Progressing Goal: Will remain free from infection Outcome: Progressing Goal: Diagnostic test results will improve Outcome: Progressing   Problem: Nutrition: Goal: Adequate nutrition will be maintained Outcome: Progressing   Problem: Safety: Goal: Ability to remain free from injury will improve Outcome: Progressing

## 2022-04-07 DIAGNOSIS — G931 Anoxic brain damage, not elsewhere classified: Secondary | ICD-10-CM | POA: Diagnosis not present

## 2022-04-07 DIAGNOSIS — G9341 Metabolic encephalopathy: Secondary | ICD-10-CM | POA: Diagnosis not present

## 2022-04-07 DIAGNOSIS — N390 Urinary tract infection, site not specified: Secondary | ICD-10-CM | POA: Diagnosis not present

## 2022-04-07 DIAGNOSIS — N179 Acute kidney failure, unspecified: Secondary | ICD-10-CM | POA: Diagnosis not present

## 2022-04-07 LAB — COMPREHENSIVE METABOLIC PANEL
ALT: 32 U/L (ref 0–44)
AST: 87 U/L — ABNORMAL HIGH (ref 15–41)
Albumin: 3.4 g/dL — ABNORMAL LOW (ref 3.5–5.0)
Alkaline Phosphatase: 53 U/L (ref 38–126)
Anion gap: 10 (ref 5–15)
BUN: 12 mg/dL (ref 8–23)
CO2: 21 mmol/L — ABNORMAL LOW (ref 22–32)
Calcium: 8.9 mg/dL (ref 8.9–10.3)
Chloride: 107 mmol/L (ref 98–111)
Creatinine, Ser: 1.31 mg/dL — ABNORMAL HIGH (ref 0.44–1.00)
GFR, Estimated: 40 mL/min — ABNORMAL LOW (ref >60)
Glucose, Bld: 111 mg/dL — ABNORMAL HIGH (ref 70–99)
Potassium: 4 mmol/L (ref 3.5–5.1)
Sodium: 138 mmol/L (ref 135–145)
Total Bilirubin: 0.4 mg/dL (ref 0.3–1.2)
Total Protein: 6.8 g/dL (ref 6.5–8.1)

## 2022-04-07 LAB — GLUCOSE, CAPILLARY
Glucose-Capillary: 101 mg/dL — ABNORMAL HIGH (ref 70–99)
Glucose-Capillary: 125 mg/dL — ABNORMAL HIGH (ref 70–99)
Glucose-Capillary: 177 mg/dL — ABNORMAL HIGH (ref 70–99)
Glucose-Capillary: 212 mg/dL — ABNORMAL HIGH (ref 70–99)
Glucose-Capillary: 239 mg/dL — ABNORMAL HIGH (ref 70–99)
Glucose-Capillary: 98 mg/dL (ref 70–99)

## 2022-04-07 LAB — CBC
HCT: 34.5 % — ABNORMAL LOW (ref 36.0–46.0)
Hemoglobin: 11 g/dL — ABNORMAL LOW (ref 12.0–15.0)
MCH: 27.9 pg (ref 26.0–34.0)
MCHC: 31.9 g/dL (ref 30.0–36.0)
MCV: 87.6 fL (ref 80.0–100.0)
Platelets: 239 10*3/uL (ref 150–400)
RBC: 3.94 MIL/uL (ref 3.87–5.11)
RDW: 14.1 % (ref 11.5–15.5)
WBC: 7 10*3/uL (ref 4.0–10.5)
nRBC: 0 % (ref 0.0–0.2)

## 2022-04-07 MED ORDER — GABAPENTIN 100 MG PO CAPS
100.0000 mg | ORAL_CAPSULE | Freq: Three times a day (TID) | ORAL | Status: DC
  Administered 2022-04-07 – 2022-04-08 (×4): 100 mg via ORAL
  Filled 2022-04-07 (×4): qty 1

## 2022-04-07 MED ORDER — DOCUSATE SODIUM 100 MG PO CAPS: 100.0000 mg | ORAL_CAPSULE | Freq: Every day | ORAL | Status: DC | PRN

## 2022-04-07 MED ORDER — DULOXETINE HCL 30 MG PO CPEP
30.0000 mg | ORAL_CAPSULE | Freq: Every day | ORAL | Status: DC
  Administered 2022-04-07 – 2022-04-08 (×2): 30 mg via ORAL
  Filled 2022-04-07 (×2): qty 1

## 2022-04-07 MED ORDER — MELATONIN 3 MG PO TABS
3.0000 mg | ORAL_TABLET | Freq: Every day | ORAL | Status: DC
  Administered 2022-04-07: 3 mg via ORAL
  Filled 2022-04-07: qty 1

## 2022-04-07 MED ORDER — INSULIN ASPART 100 UNIT/ML IJ SOLN
0.0000 [IU] | Freq: Three times a day (TID) | INTRAMUSCULAR | Status: DC
  Administered 2022-04-08: 2 [IU] via SUBCUTANEOUS
  Administered 2022-04-08: 7 [IU] via SUBCUTANEOUS

## 2022-04-07 NOTE — Plan of Care (Signed)
  Problem: Fluid Volume: Goal: Hemodynamic stability will improve Outcome: Progressing   Problem: Clinical Measurements: Goal: Diagnostic test results will improve Outcome: Progressing Goal: Signs and symptoms of infection will decrease Outcome: Progressing   Problem: Respiratory: Goal: Ability to maintain adequate ventilation will improve Outcome: Progressing   Problem: Clinical Measurements: Goal: Diagnostic test results will improve Outcome: Progressing Goal: Respiratory complications will improve Outcome: Progressing Goal: Cardiovascular complication will be avoided Outcome: Progressing   Problem: Activity: Goal: Risk for activity intolerance will decrease Outcome: Progressing

## 2022-04-07 NOTE — Plan of Care (Signed)
Monitoring pt she is hallucinating. Other than that pt is eating well. BM today. Urinating good.  Problem: Fluid Volume: Goal: Hemodynamic stability will improve Outcome: Progressing   Problem: Clinical Measurements: Goal: Diagnostic test results will improve Outcome: Progressing Goal: Signs and symptoms of infection will decrease Outcome: Progressing   Problem: Respiratory: Goal: Ability to maintain adequate ventilation will improve Outcome: Progressing   Problem: Health Behavior/Discharge Planning: Goal: Ability to manage health-related needs will improve Outcome: Progressing   Problem: Clinical Measurements: Goal: Ability to maintain clinical measurements within normal limits will improve Outcome: Progressing Goal: Will remain free from infection Outcome: Progressing Goal: Diagnostic test results will improve Outcome: Progressing Goal: Respiratory complications will improve Outcome: Progressing Goal: Cardiovascular complication will be avoided Outcome: Progressing   Problem: Activity: Goal: Risk for activity intolerance will decrease Outcome: Progressing   Problem: Nutrition: Goal: Adequate nutrition will be maintained Outcome: Progressing   Problem: Safety: Goal: Ability to remain free from injury will improve Outcome: Progressing

## 2022-04-07 NOTE — Progress Notes (Signed)
PROGRESS NOTE Holly Valdez  ZOX:[id number] DOB: 1936/06/07 DOA: 04/05/2022 PCP: Lucianne Lei, MD   Brief Narrative/Hospital Course: 86 year old female with past medical history of non-insulin-dependent diabetes mellitus type 2, chronic kidney disease stage IIIb, hyperlipidemia, dementia, chronic low back pain presented from [place] facility via EMS for unresponsiveness hypotension and bradycardia. History is limited as p- she was  nonverbal and minimally responsive, unable to contact anyone on admission. For EMS- she was bradycardic HR-44 and hypotensive 50/38 InED-S/P Narcan with minimal effect.Initial concern for sepsis   given intravenous antibiotics. She was admitted and neuro was consulted. Concern for multifactorial encephalopathy with surprising finding of global anoxic injury and MRI brain. Followed by neurology, work-up significant for UTI.  Urine culture grew Aerococcus susceptibility not done. Seen by speech advise regular thin liquid with mild aspiration risk.    Subjective: Seen and examined this morning patient is alert awake pleasantly confused able to only tell me her name and date of birth.  Assessment and Plan: Principal Problem:   Acute metabolic encephalopathy Active Problems:   Complicated UTI (urinary tract infection)   Acute renal failure superimposed on stage 3b chronic kidney disease (HCC)   Anoxic brain injury (HCC)   Lactic acidosis   Elevated troponin level not due myocardial infarction   Type 2 diabetes mellitus with stage 3b chronic kidney disease, with long-term current use of insulin (HCC)   Dementia (HCC)   Anemia in chronic kidney disease   Spinal stenosis   Diabetic retinopathy ([place])   Hyperlipidemia, unspecified   Acute metabolic encephalopathy Anoxic brain injury-per MRI brain Dementia Per chart: Presented with unresponsiveness -which is now improved she is alert awake pleasantly confused, has history of dementia per  chart. Per neurology MRI shows restricted diffusion symmetric in the parietal cortex bilaterally as well as basal ganglia bilaterally, this can be seen in anoxic insult although not reported by EMS and pulse ox stable here can also seen in carbon monoxide poisoning, carboxyhemoglobin panel ordered-was normal at 1.5 methemoglobin 0.9.  Patient's EEG-with generalized continuous-flow suggestive of moderate diffuse encephalopathy, no seizure.  So far work-up shows UTI, volume depletion/profound hypotension.  BP improved, lactic acidosis resolved.  Continue ceftriaxone, iv thiamine, PT OT supportive care.   Complicated UTI -urine culture with Aerococcus -continue ceftriaxone as above   Acute renal failure superimposed on stage 3b chronic kidney disease: Baseline creatinine 1.3-1.4 in 2021, improved gentle hydration, encourage oral intake  Hypertension holding her losartan, resume hydralazine if BP remains persistently over 160s Hyperlipidemia holding her Lipitor. Lactic acidosis-due to dehydration resolved Mild transaminitis AST slightly uptrending.  Monitor Elevated troponin level not due myocardial infarction- hs trop 54>47-flat mildly elevated suspect demand ischemia due to dehydration hypotension T2DM W/ stage 3b chronic kidney disease, with long-term current use of insulin w/ diabetic retinopathy, neuropathy: A1c uncontrolled 9.9 blood sugar stable continue SSI, hold her Lantus, Tradjenta.  Resume Neurontin Recent Labs  Lab 04/06/22 0254 04/06/22 0410 04/06/22 1623 04/06/22 1941 04/07/22 0008 04/07/22 0346 04/07/22 0922  GLUCAP  --    < > 86 86 98 101* 125*  HGBA1C 9.9*  --   --   --   --   --   --    < > = values in this interval not displayed.    Anemia in chronic kidney disease: Hemoglobin stable.  Monitor Class I Obesity:Patient's Body mass index is 30.54 kg/m. : Will benefit with PCP follow-up, weight loss  healthy lifestyle.   DVT prophylaxis: enoxaparin (  LOVENOX) injection 30 mg  Start: 04/06/22 1000 Code Status:   Code Status: DNR Family Communication: plan of care discussed with patient/NO FAMILY at bedside. Patient status is: Inpatient because of ongoing management of confusion, UTI Level of care: Progressive > downgrade to telemetry  Dispo: The patient is from: [place]            Anticipated disposition: Back to facility hopefully next 1 to 2 days  Mobility Assessment (last 72 hours)     Mobility Assessment     Row Name 04/06/22 2113 04/06/22 0800 04/05/22 2001 04/05/22 09:14:08     Does patient have an order for bedrest or is patient medically unstable No - Continue assessment No - Continue assessment No - Continue assessment No - Continue assessment    What is the highest level of mobility based on the progressive mobility assessment? Level 1 (Bedfast) - Unable to balance while sitting on edge of bed Level 1 (Bedfast) - Unable to balance while sitting on edge of bed Level 1 (Bedfast) - Unable to balance while sitting on edge of bed Level 1 (Bedfast) - Unable to balance while sitting on edge of bed    Is the above level different from baseline mobility prior to current illness? Yes - Recommend PT order Yes - Recommend PT order Yes - Recommend PT order Yes - Recommend PT order              Objective: Vitals last 24 hrs: Vitals:   04/07/22 0052 04/07/22 0350 04/07/22 0604 04/07/22 0744  BP: (!) 158/66 (!) 168/79 124/72 (!) 158/66  Pulse: 63 (!) 56 (!) 57 61  Resp:  16 16 16   Temp:  98.8 F (37.1 C)  97.8 F (36.6 C)  TempSrc:  Oral  Oral  SpO2:  100% 100% 100%  Weight:       Weight change:   Physical Examination: General exam: alert awake oriented x1,older than stated age, weak appearing. HEENT:Oral mucosa moist, Ear/Nose WNL grossly, dentition normal. Respiratory system: bilaterally diminished BS, no use of accessory muscle Cardiovascular system: S1 & S2 +, No JVD. Gastrointestinal system: Abdomen soft,NT,ND, BS+ Nervous System:Alert,  awake, moving extremities and grossly nonfocal Extremities: LE edema none,distal peripheral pulses palpable.  Skin: No rashes,no icterus. MSK: Normal muscle bulk,tone, power  Medications reviewed:  Scheduled Meds:  enoxaparin (LOVENOX) injection  30 mg Subcutaneous Q24H   insulin aspart  0-15 Units Subcutaneous Q4H   Continuous Infusions:  cefTRIAXone (ROCEPHIN)  IV 2 g (04/06/22 2141)   dextrose 5 % and 0.9% NaCl 75 mL/hr at 04/07/22 0247   thiamine injection 500 mg (04/07/22 1008)      Diet Order             Diet heart healthy/carb modified Room service appropriate? Yes; Fluid consistency: Thin  Diet effective now                   Intake/Output Summary (Last 24 hours) at 04/07/2022 1052 Last data filed at 04/07/2022 1044 Gross per 24 hour  Intake 1443.8 ml  Output 2850 ml  Net -1406.2 ml   Net IO Since Admission: -2,206.2 mL [04/07/22 1052]  Wt Readings from Last 3 Encounters:  04/05/22 75.8 kg  12/09/20 75 kg  10/10/20 75 kg     Unresulted Labs (From admission, onward)    None     Data Reviewed: I have personally reviewed following labs and imaging studies CBC: Recent Labs  Lab 04/05/22 0048 04/05/22 0056  04/05/22 0057 04/06/22 0254 04/07/22 0508  WBC 7.6  --   --  8.8 7.0  NEUTROABS 5.4  --   --  6.1  --   HGB 9.7* 10.2* 10.2* 9.6* 11.0*  HCT 30.6* 30.0* 30.0* 29.5* 34.5*  MCV 89.5  --   --  87.0 87.6  PLT 225  --   --  247 782   Basic Metabolic Panel: Recent Labs  Lab 04/05/22 0048 04/05/22 0056 04/05/22 0057 04/06/22 0254 04/07/22 0508  NA 135 135 136 140 138  K 4.2 4.2 4.2 4.1 4.0  CL 106 103  --  113* 107  CO2 19*  --   --  22 21*  GLUCOSE 240* 254*  --  78 111*  BUN 31* 30*  --  21 12  CREATININE 2.09* 2.10*  --  1.50* 1.31*  CALCIUM 8.6*  --   --  8.7* 8.9  MG  --   --   --  2.0  --    GFR: CrCl cannot be calculated (Unknown ideal weight.). Liver Function Tests: Recent Labs  Lab 04/05/22 0048 04/06/22 0254 04/07/22 0508   AST 22 76* 87*  ALT 17 27 32  ALKPHOS 56 50 53  BILITOT 0.4 0.5 0.4  PROT 5.9* 5.8* 6.8  ALBUMIN 3.0* 2.9* 3.4*   No results for input(s): LIPASE, AMYLASE in the last 168 hours. Recent Labs  Lab 04/05/22 0049  AMMONIA 11   Coagulation Profile: Recent Labs  Lab 04/05/22 0048  INR 1.1   BNP (last 3 results) No results for input(s): PROBNP in the last 8760 hours. HbA1C: Recent Labs    04/06/22 0254  HGBA1C 9.9*   CBG: Recent Labs  Lab 04/06/22 1623 04/06/22 1941 04/07/22 0008 04/07/22 0346 04/07/22 0922  GLUCAP 86 86 98 101* 125*   Lipid Profile: No results for input(s): CHOL, HDL, LDLCALC, TRIG, CHOLHDL, LDLDIRECT in the last 72 hours. Thyroid Function Tests: Recent Labs    04/05/22 0048  TSH 4.260   Sepsis Labs: Recent Labs  Lab 04/05/22 0256 04/05/22 0930 04/06/22 0254 04/06/22 0610  LATICACIDVEN 2.5* 3.4* 0.7 0.8    Recent Results (from the past 240 hour(s))  Culture, blood (routine x 2)     Status: None (Preliminary result)   Collection Time: 04/05/22 12:48 AM   Specimen: BLOOD  Result Value Ref Range Status   Specimen Description BLOOD LEFT ANTECUBITAL  Final   Special Requests   Final    BOTTLES DRAWN AEROBIC AND ANAEROBIC Blood Culture results may not be optimal due to an inadequate volume of blood received in culture bottles   Culture   Final    NO GROWTH 1 DAY Performed at [facility], [place] [address] [place], [place] [address]    Report Status PENDING  Incomplete  Culture, blood (routine x 2)     Status: None (Preliminary result)   Collection Time: 04/05/22 12:55 AM   Specimen: BLOOD  Result Value Ref Range Status   Specimen Description BLOOD SITE NOT SPECIFIED  Final   Special Requests   Final    BOTTLES DRAWN AEROBIC AND ANAEROBIC Blood Culture results may not be optimal due to an inadequate volume of blood received in culture bottles   Culture   Final    NO GROWTH 1 DAY Performed at [facility], [place] [address], [place], [place] [address]    Report Status PENDING  Incomplete  Urine Culture     Status:  Abnormal   Collection Time: 04/05/22  1:01 AM   Specimen: Urine, Catheterized  Result Value Ref Range Status   Specimen Description URINE, CATHETERIZED  Final   Special Requests NONE  Final   Culture (A)  Final    80,000 COLONIES/mL YEAST >=100,000 COLONIES/mL AEROCOCCUS SPECIES Standardized susceptibility testing for this organism is not available. Performed at [facility], [place] [address], [place], [place] [address]    Report Status 04/06/2022 FINAL  Final    Antimicrobials: Anti-infectives (From admission, onward)    Start     Dose/Rate Route Frequency Ordered Stop   04/07/22 1000  vancomycin (VANCOCIN) IVPB 1000 mg/200 mL premix  Status:  Discontinued        1,000 mg 200 mL/hr over 60 Minutes Intravenous Every 48 hours 04/05/22 0752 04/06/22 1428   04/05/22 2200  cefTRIAXone (ROCEPHIN) 2 g in sodium chloride 0.9 % 100 mL IVPB        2 g 200 mL/hr over 30 Minutes Intravenous Every 24 hours 04/05/22 0549 04/08/22 2159   04/05/22 0130  aztreonam (AZACTAM) 2 g in sodium chloride 0.9 % 100 mL IVPB  Status:  Discontinued        2 g 200 mL/hr over 30 Minutes Intravenous  Once 04/05/22 0119 04/05/22 0123   04/05/22 0130  metroNIDAZOLE (FLAGYL) IVPB 500 mg        500 mg 100 mL/hr over 60 Minutes Intravenous  Once 04/05/22 0119 04/05/22 0232   04/05/22 0130  vancomycin (VANCOCIN) IVPB 1000 mg/200 mL premix  Status:  Discontinued        1,000 mg 200 mL/hr over 60 Minutes Intravenous  Once 04/05/22 0119 04/05/22 0123   04/05/22 0130  ceFEPIme (MAXIPIME) 2 g in sodium chloride 0.9 % 100 mL IVPB       Note to Pharmacy: Has tolerated multiple cephalosporins in the past   2 g 200 mL/hr over 30 Minutes Intravenous  Once 04/05/22 0123 04/05/22 0204   04/05/22 0130  vancomycin (VANCOREADY) IVPB 1500 mg/300 mL        1,500 mg 150 mL/hr over 120 Minutes Intravenous  Once 04/05/22 0123 04/05/22  0449      Culture/Microbiology    Component Value Date/Time   SDES URINE, CATHETERIZED 04/05/2022 0101   SPECREQUEST NONE 04/05/2022 0101   CULT (A) 04/05/2022 0101    80,000 COLONIES/mL YEAST >=100,000 COLONIES/mL AEROCOCCUS SPECIES Standardized susceptibility testing for this organism is not available. Performed at [facility], [place] [address] [place], [place] [address]    REPTSTATUS 04/06/2022 FINAL 04/05/2022 0101    Other culture-see note  Radiology Studies: No results found.   LOS: 2 days   Antonieta Pert, MD [facility]  04/07/2022, 10:52 AM

## 2022-04-07 NOTE — Evaluation (Addendum)
Clinical/Bedside Swallow Evaluation Patient Details  Name: Holly Valdez MRN: [id number] Date of Birth: July 20, 1936  Today's Date: 04/07/2022 Time: SLP Start Time (ACUTE ONLY): 0755 SLP Stop Time (ACUTE ONLY): 0830 SLP Time Calculation (min) (ACUTE ONLY): 35 min  Past Medical History:  Past Medical History:  Diagnosis Date   Dementia ([place])    Diabetes mellitus without complication ([place])    Hypertension    Past Surgical History: History reviewed. No pertinent surgical history. HPI:  Pt admitted to [facility] with AMS.  H/o of non-insulin-dependent diabetes mellitus type 2, chronic kidney disease stage IIIb, hyperlipidemia, dementia, chronic low back pain who presents from [place] facility via EMS for unresponsiveness hypotension and bradycardia. Pt failed Yale stroke swallow screen - denies dysphagia.  PT found to have a hypoxic injury.     Assessment / Plan / Recommendation  Clinical Impression  Patient presents with functional oropharyngeal swallow ability based on clinical swallow evaluaiton.  No focal CN deficites, pt tends to tilt head to the right and leans slightly to the right.  Able to repositioned - She easily passed 3 ounce Yale test.  No indication of aspiration with all po including 3 ounces water, 2 ounces apple juice, applesauce and crackers.  No oral retention - minimally prolonged oral transiting - pt has dentures that may be slightly loose.  Pt reports pill dysphagia - improved tolerance when she takes medications with slightly thicker liquids.  She is delayed in her verbal responses and is easily distracted during session c/w her dementia.    Recommend regular/thin diet - with esophageal precautions given pt's eructation and report of GERD.  Advised pt remove her dentures nightly and brush gums/tongue. SLP Visit Diagnosis: Dysphagia, unspecified (R13.10)    Aspiration Risk  Mild aspiration risk    Diet Recommendation Regular;Thin liquid   Liquid  Administration via: Cup;Straw Medication Administration: Whole meds with liquid (slighly thicker liquids per pt request - ? w/Glucerna) Supervision: Patient able to self feed Compensations: Slow rate;Small sips/bites Postural Changes: Seated upright at 90 degrees;Remain upright for at least 30 minutes after po intake    Other  Recommendations Oral Care Recommendations: Oral care BID    Recommendations for follow up therapy are one component of a multi-disciplinary discharge planning process, led by the attending physician.  Recommendations may be updated based on patient status, additional functional criteria and insurance authorization.  Follow up Recommendations No SLP follow up      Assistance Recommended at Discharge None  Functional Status Assessment Patient has not had a recent decline in their functional status  Frequency and Duration     N/a       Prognosis    N/a    Swallow Study   General Date of Onset: 04/07/22 HPI: Pt admitted to [facility] with AMS.  H/o of non-insulin-dependent diabetes mellitus type 2, chronic kidney disease stage IIIb, hyperlipidemia, dementia, chronic low back pain who presents from [place] facility via EMS for unresponsiveness hypotension and bradycardia. Pt failed Yale stroke swallow screen - denies dysphagia. Type of Study: Bedside Swallow Evaluation Diet Prior to this Study: NPO Temperature Spikes Noted: No Respiratory Status: Room air History of Recent Intubation: No Behavior/Cognition: Cooperative;Alert;Pleasant mood Oral Cavity Assessment: Within Functional Limits Oral Care Completed by SLP: No Oral Cavity - Dentition: Dentures, top;Adequate natural dentition Vision: Functional for self-feeding Self-Feeding Abilities: Able to feed self Patient Positioning: Upright in bed Baseline Vocal Quality: Normal Volitional Cough: Weak Volitional Swallow: Able to  elicit    Oral/Motor/Sensory Function Overall Oral Motor/Sensory  Function: Within functional limits   Ice Chips Ice chips: Not tested   Thin Liquid Thin Liquid: Within functional limits Presentation: Straw    Nectar Thick Nectar Thick Liquid: Not tested   Honey Thick Honey Thick Liquid: Not tested   Puree Puree: Within functional limits Presentation: Self Fed;Spoon   Solid     Solid: Within functional limits Presentation: Self Fed;Spoon      Macario Golds 04/07/2022,8:34 AM  Kathleen Lime, MS [facility] SLP [place] Office [phone] Pager [phone]

## 2022-04-07 NOTE — Progress Notes (Signed)
Daily Progress Note   Patient Name: Holly Valdez       Date: 04/07/2022 DOB: [birth year]  Age: 86 y.o. MRN#: [id number] Attending Physician: Antonieta Pert, MD Primary Care Physician: Lucianne Lei, MD Admit Date: 04/05/2022  Reason for Consultation/Follow-up: Establishing goals of care  Subjective: Medical records reviewed. Patient assessed at the bedside. Discussed with RN.  Patient is pleasantly confused, tells me she is feeling fine.  Her daughter is present at the bedside for scheduled family meeting.  We reviewed patient's marked improvement over the last 48 hours, current treatment plan, and created space and opportunity for families thoughts and feelings on goals of care moving forward.  Holly Valdez is grateful for patient's return to baseline, also very worried about how she will do when she returns to nursing facility.  Emotional support and therapeutic listening was provided as she reflected on the year and a half of no healthcare other than checkup for her eyes and hearing aid evaluation.  Patient also used to go to a podiatrist and this has not been done.  She was telling SNF of her suspicion for UTI for months.  We discussed patient's family support and Holly Valdez shares that patient has many adult children who are no longer in her life.  She knows patient through a shared godmother who raised them both.  She considers patient to be her mother due to their age difference and patient has treated her as a daughter.  A MOST form was introduced.  Concepts specific to code status, artifical feeding and hydration, and rehospitalization were considered and discussed.  Goals are for limited medical interventions and primarily focus on quality of life.  Outpatient palliative care referral was offered and  explained.  Questions and concerns addressed. PMT will continue to support holistically.   Length of Stay: 2   Physical Exam Vitals and nursing note reviewed.  Constitutional:      General: She is not in acute distress. Cardiovascular:     Rate and Rhythm: Normal rate.  Pulmonary:     Effort: Pulmonary effort is normal.  Skin:    General: Skin is warm and dry.  Neurological:     Mental Status: She is alert. Mental status is at baseline.  Psychiatric:        Mood and Affect: Mood normal.  Vital Signs: BP (!) 163/74 (BP Location: Left Arm)   Pulse 75   Temp 97.8 F (36.6 C) (Oral)   Resp 16   Wt 75.8 kg   SpO2 98%   BMI 30.54 kg/m  SpO2: SpO2: 98 % O2 Device: O2 Device: Room Air O2 Flow Rate: O2 Flow Rate (L/min): 2 L/min      Palliative Assessment/Data:       Palliative Care Assessment & Plan   Patient Profile: 86 y.o. female  with past medical history of non-insulin-dependent diabetes mellitus type 2, chronic kidney disease stage IIIb, hyperlipidemia, dementia, chronic low back pain   admitted on 04/05/2022 with unresponsiveness hypotension and bradycardia.    Patient is from [place] skilled nursing facility, admitted for acute metabolic encephalopathy, complicated UTI, AKI on [id number], anoxic brain injury. PMT has been consulted to assist with goals of care conversation.    Assessment: Goals of care conversation Anoxic brain injury UTI, complicated Acute metabolic encephalopathy Dementia AKI on CKD 3B  Recommendations/Plan: DNR Continue current care MOST form was completed and copy was provided to patient's daughter.  Hard copy left on patient's chart for discharge to SNF.  Will scan copy into [facility] Psychosocial and emotional support provided Outpatient palliative care referral at discharge, patient's daughter would be open to hospice if she declines further PMT available for acute needs   Prognosis:  Unable to determine  Discharge  Planning: To Be Determined  Care plan was discussed with patient, patient's daughter, RN   Total time: I spent 70 minutes in the care of the patient today in the above activities and documenting the encounter.         Holly Stonerock Johnnette Litter, PA-C  Palliative Medicine Team Team phone # [phone]  Thank you for allowing the Palliative Medicine Team to assist in the care of this patient. Please utilize secure chat with additional questions, if there is no response within 30 minutes please call the above phone number.  Palliative Medicine Team providers are available by phone from 7am to 7pm daily and can be reached through the team cell phone.  Should this patient require assistance outside of these hours, please call the patient's attending physician.

## 2022-04-08 DIAGNOSIS — G9341 Metabolic encephalopathy: Secondary | ICD-10-CM | POA: Diagnosis not present

## 2022-04-08 LAB — GLUCOSE, CAPILLARY
Glucose-Capillary: 237 mg/dL — ABNORMAL HIGH (ref 70–99)
Glucose-Capillary: 155 mg/dL — ABNORMAL HIGH (ref 70–99)
Glucose-Capillary: 326 mg/dL — ABNORMAL HIGH (ref 70–99)

## 2022-04-08 LAB — HEPATIC FUNCTION PANEL
ALT: 26 U/L (ref 0–44)
AST: 46 U/L — ABNORMAL HIGH (ref 15–41)
Albumin: 3.1 g/dL — ABNORMAL LOW (ref 3.5–5.0)
Alkaline Phosphatase: 53 U/L (ref 38–126)
Bilirubin, Direct: <0.1 mg/dL (ref 0.0–0.2)
Total Bilirubin: 0.4 mg/dL (ref 0.3–1.2)
Total Protein: 6.7 g/dL (ref 6.5–8.1)

## 2022-04-08 MED ORDER — INSULIN GLARGINE 100 UNIT/ML SOLOSTAR PEN: 5.0000 [IU] | PEN_INJECTOR | Freq: Every day | SUBCUTANEOUS | 0 refills | Status: DC

## 2022-04-08 MED ORDER — INSULIN GLARGINE 100 UNIT/ML SOLOSTAR PEN: 10.0000 [IU] | PEN_INJECTOR | Freq: Every day | SUBCUTANEOUS | 0 refills | Status: DC

## 2022-04-08 MED ORDER — CEFADROXIL 500 MG PO CAPS
500.0000 mg | ORAL_CAPSULE | Freq: Two times a day (BID) | ORAL | Status: DC
  Administered 2022-04-08: 500 mg via ORAL
  Filled 2022-04-08 (×2): qty 1

## 2022-04-08 MED ORDER — CEFADROXIL 500 MG PO CAPS: 500.0000 mg | ORAL_CAPSULE | Freq: Two times a day (BID) | ORAL | 0 refills | Status: AC

## 2022-04-08 MED ORDER — THIAMINE HCL 100 MG PO TABS: 100.0000 mg | ORAL_TABLET | Freq: Every day | ORAL | 0 refills | Status: AC

## 2022-04-08 MED ORDER — THIAMINE HCL 100 MG PO TABS
100.0000 mg | ORAL_TABLET | Freq: Every day | ORAL | Status: DC
  Filled 2022-04-08: qty 1

## 2022-04-08 NOTE — Discharge Summary (Signed)
Physician Discharge Summary  JASIAH BUNTIN WUJ:[id number] DOB: July 21, 1936 DOA: 04/05/2022  PCP: Lucianne Lei, MD  Admit date: 04/05/2022 Discharge date: 04/08/2022 Recommendations for Outpatient Follow-up:  Follow up with PCP in 1 weeks-call for appointment Follow-up with neurology Dr. And to repeat MRI in 1 to 2 weeks, Follow-up with podiatry for your toe/nail care   Discharge Dispo: SNF Discharge Condition: Stable Code Status:   Code Status: DNR Diet recommendation:  Diet Order             Diet heart healthy/carb modified Room service appropriate? No; Fluid consistency: Thin  Diet effective now           Diet Carb Modified                    Brief/Interim Summary: 86 year old female with past medical history of non-insulin-dependent diabetes mellitus type 2, chronic kidney disease stage IIIb, hyperlipidemia, dementia, chronic low back pain presented from [place] facility via EMS for unresponsiveness hypotension and bradycardia. History is limited as p- she was  nonverbal and minimally responsive, unable to contact anyone on admission. For EMS- she was bradycardic HR-44 and hypotensive 50/38 InED-S/P Narcan with minimal effect.Initial concern for sepsis   given intravenous antibiotics. She was admitted and neuro was consulted. Concern for multifactorial encephalopathy with surprising finding of global anoxic injury and MRI brain. Followed by neurology, work-up significant for UTI.  Urine culture grew Aerococcus susceptibility not done. Seen by speech advise regular thin liquid with mild aspiration risk.  Mental status has returned to baseline patient was seen by palliative care most from completed, DNR.  At this time patient is stable for discharge back to skilled nursing facility on oral antibiotics and have outpatient follow-up with palliative care.   Discharge Diagnoses:  Principal Problem:   Acute metabolic encephalopathy Active Problems:    Complicated UTI (urinary tract infection)   Acute renal failure superimposed on stage 3b chronic kidney disease (HCC)   Anoxic brain injury (HCC)   Lactic acidosis   Elevated troponin level not due myocardial infarction   Type 2 diabetes mellitus with stage 3b chronic kidney disease, with long-term current use of insulin (HCC)   Dementia (HCC)   Anemia in chronic kidney disease   Spinal stenosis   Diabetic retinopathy ([place])   Hyperlipidemia, unspecified  Acute metabolic encephalopathy Anoxic brain injury-per MRI brain Dementia Per chart: Presented with unresponsiveness -which is now improved she is alert awake pleasantly confused, has history of dementia per chart. Per neurology MRI shows restricted diffusion symmetric in the parietal cortex bilaterally as well as basal ganglia bilaterally, this can be seen in anoxic insult although not reported by EMS and pulse ox stable here can also seen in carbon monoxide poisoning, carboxyhemoglobin panel ordered-was normal at 1.5 methemoglobin 0.9.  Patient's EEG-with generalized continuous-flow suggestive of moderate diffuse encephalopathy, no seizure.  So far work-up shows UTI, volume depletion/profound hypotension.  BP ahs improved, lactic acidosis resolved.  Mental status stable improved back to baseline.  Complete oral antibiotics, continue oral thiamine, return back to facility.  Follow-up with neurology for repeat MRI in 1 to 2 weeks   Complicated UTI -urine culture with Aerococcus -s/p ceftriaxone-completed oral meds   Acute renal failure superimposed on stage 3b chronic kidney disease: Baseline creatinine 1.3-1.4 in 2021, improved gentle hydration, encourage oral intake   Hypertension blood pressure starting to trend up, resume home meds monitor at the facility Hyperlipidemia holding her Lipitor. Lactic acidosis-due to dehydration  resolved Mild transaminitis AST downtrending on repeat LFTs.  Follow-up outpatient  Elevated troponin level not  due myocardial infarction- hs trop 54>47-flat mildly elevated suspect demand ischemia due to dehydration hypotension T2DM W/ stage 3b chronic kidney disease, with long-term current use of insulin w/ diabetic retinopathy, neuropathy: A1c uncontrolled 9.9 blood sugar stable continue SSI, resume Lantus at lower dose and can go up on home dose rosuvastatin stable as blood sugar currently controlled just with sliding scale insulin, continue Tradjenta, continue Neurontin. Recent Labs  Lab 04/06/22 0254 04/06/22 0410 04/07/22 1607 04/07/22 1938 04/07/22 2208 04/08/22 0624 04/08/22 1131  GLUCAP  --    < > 237* 212* 177* 155* 326*  HGBA1C 9.9*  --   --   --   --   --   --    < > = values in this interval not displayed.      Anemia in chronic kidney disease: Hemoglobin stable.  Monitor Class I Obesity:Patient's Body mass index is 30.54 kg/m. : Will benefit with PCP follow-up, weight loss  healthy lifestyle  Consults: Neurology Subjective: Alert awake and pleasantly confused daughter at the bedside.  Negative for discharge back to the facility.  Patient is back to baseline eating and talking well.  Discharge Exam: Vitals:   04/08/22 0751 04/08/22 1106  BP: (!) 152/55 (!) 163/73  Pulse: 64 76  Resp: 16 16  Temp: 98.8 F (37.1 C) 98.7 F (37.1 C)  SpO2: 100% 96%   General: Pt is alert, awake, not in acute distress Cardiovascular: RRR, S1/S2 +, no rubs, no gallops Respiratory: CTA bilaterally, no wheezing, no rhonchi Abdominal: Soft, NT, ND, bowel sounds + Extremities: no edema, no cyanosis  Discharge Instructions  Discharge Instructions     Diet Carb Modified   Complete by: As directed    Discharge instructions   Complete by: As directed    Please call call MD or return to ER for similar or worsening recurring problem that brought you to hospital or if any fever,nausea/vomiting,abdominal pain, uncontrolled pain, chest pain,  shortness of breath or any other alarming  symptoms.  Please take her to podaatry for toe nail care in 1-2 wks  And with neurology in 2 wks time  Please follow-up your doctor as instructed in a week time and call the office for appointment.  Please avoid alcohol, smoking, or any other illicit substance and maintain healthy habits including taking your regular medications as prescribed.  You were cared for by a hospitalist during your hospital stay. If you have any questions about your discharge medications or the care you received while you were in the hospital after you are discharged, you can call the unit and ask to speak with the hospitalist on call if the hospitalist that took care of you is not available.  Once you are discharged, your primary care physician will handle any further medical issues. Please note that NO REFILLS for any discharge medications will be authorized once you are discharged, as it is imperative that you return to your primary care physician (or establish a relationship with a primary care physician if you do not have one) for your aftercare needs so that they can reassess your need for medications and monitor your lab values   Check blood sugar 3 times a day and bedtime at home. If blood sugar running above 200 less than 70 please call your MD to adjust insulin. If blood sugars running less 100 do not use insulin and call MD. If  you noticed signs and symptoms of hypoglycemia or low blood sugar like jitteriness, confusion, thirst, tremor, sweating- Check blood sugar, drink sugary drink/biscuits/sweets to increase sugar level and call MD or return to ER.   Increase activity slowly   Complete by: As directed       Allergies as of 04/08/2022       Reactions   Penicillins Other (See Comments)   Unknown reaction - Tolerated keflex and ceftriaxone while hospitalized 09/2020        Medication List     TAKE these medications    atorvastatin 40 MG tablet Commonly known as: LIPITOR Take 40 mg by mouth  every evening.   calcium carbonate 1500 (600 Ca) MG Tabs tablet Commonly known as: OSCAL Take 1,500 mg by mouth 2 (two) times daily.   cefadroxil 500 MG capsule Commonly known as: DURICEF Take 1 capsule (500 mg total) by mouth 2 (two) times daily for 2 days.   Cholecalciferol 50 MCG (2000 UT) Caps Take 1 capsule by mouth daily.   diclofenac Sodium 1 % Gel Commonly known as: VOLTAREN Apply 4 g topically 4 (four) times daily.   DSS 100 MG Caps Take 100 mg by mouth daily as needed (constipation).   DULoxetine HCl 30 MG Csdr Take 30 mg by mouth daily.   gabapentin 300 MG capsule Commonly known as: NEURONTIN Take 300 mg by mouth 3 (three) times daily.   GLUCERNA 1.2 CAL PO Take 1 tablet by mouth in the morning, at noon, in the evening, and at bedtime.   hydrALAZINE 25 MG tablet Commonly known as: APRESOLINE Take 25 mg by mouth 3 (three) times daily.   insulin aspart 100 UNIT/ML injection Commonly known as: novoLOG Inject into the skin See admin instructions. Sliding scale  70-150 0 units  151-200 2 units  201-250 4 units  251-300 6 units  301-400 8 units   insulin glargine 100 UNIT/ML Solostar Pen Commonly known as: LANTUS Inject 10 Units into the skin daily. Can increase to home dose slowly if sugar start to go up. What changed:  how much to take additional instructions   linagliptin 5 MG Tabs tablet Commonly known as: TRADJENTA Take 5 mg by mouth daily.   losartan 100 MG tablet Commonly known as: COZAAR Take 100 mg by mouth daily.   melatonin 3 MG Tabs tablet Take 3 mg by mouth at bedtime.   thiamine 100 MG tablet Take 1 tablet (100 mg total) by mouth daily. Start taking on: Apr 09, 2022        Follow-up Information     Lucianne Lei, MD Follow up in 1 week(s).   Specialty: Family Medicine Contact information: [address] [place] [address] [phone]         Tat, Eustace Quail, DO Follow up in 2 week(s).   Specialty:  Neurology Contact information: [place] [address] [place], DPM Follow up in 1 week(s).   Specialty: Podiatry Why: please see him for toe/nail care Contact information: [address] [address] [phone]                Allergies  Allergen Reactions   Penicillins Other (See Comments)    Unknown reaction - Tolerated keflex and ceftriaxone while hospitalized 09/2020    The results of significant diagnostics from this hospitalization (including imaging, microbiology, ancillary and laboratory) are listed  below for reference.    Microbiology: Recent Results (from the past 240 hour(s))  Culture, blood (routine x 2)     Status: None (Preliminary result)   Collection Time: 04/05/22 12:48 AM   Specimen: BLOOD  Result Value Ref Range Status   Specimen Description BLOOD LEFT ANTECUBITAL  Final   Special Requests   Final    BOTTLES DRAWN AEROBIC AND ANAEROBIC Blood Culture results may not be optimal due to an inadequate volume of blood received in culture bottles   Culture   Final    NO GROWTH 3 DAYS Performed at [facility], [place] [address], [place], [place] [address]    Report Status PENDING  Incomplete  Culture, blood (routine x 2)     Status: None (Preliminary result)   Collection Time: 04/05/22 12:55 AM   Specimen: BLOOD  Result Value Ref Range Status   Specimen Description BLOOD SITE NOT SPECIFIED  Final   Special Requests   Final    BOTTLES DRAWN AEROBIC AND ANAEROBIC Blood Culture results may not be optimal due to an inadequate volume of blood received in culture bottles   Culture   Final    NO GROWTH 3 DAYS Performed at [facility], [place] [address], [place], [place] [address]    Report Status PENDING  Incomplete  Urine Culture     Status: Abnormal   Collection Time: 04/05/22  1:01 AM   Specimen: Urine, Catheterized  Result Value Ref Range Status   Specimen  Description URINE, CATHETERIZED  Final   Special Requests NONE  Final   Culture (A)  Final    80,000 COLONIES/mL YEAST >=100,000 COLONIES/mL AEROCOCCUS SPECIES Standardized susceptibility testing for this organism is not available. Performed at [facility], [place] [address], [place], [place] [address]    Report Status 04/06/2022 FINAL  Final    Procedures/Studies: CT ABDOMEN PELVIS WO CONTRAST  Result Date: 04/05/2022 CLINICAL DATA:  UTI, recurrent/complicated abdominal mass on examination. EXAM: CT ABDOMEN AND PELVIS WITHOUT CONTRAST TECHNIQUE: Multidetector CT imaging of the abdomen and pelvis was performed following the standard protocol without IV contrast. RADIATION DOSE REDUCTION: This exam was performed according to the departmental dose-optimization program which includes automated exposure control, adjustment of the mA and/or kV according to patient size and/or use of iterative reconstruction technique. COMPARISON:  None Available. FINDINGS: Lower chest: Large hiatal hernia with adjacent atelectasis. Linear atelectasis in the right lung base. Coronary artery calcifications. Calcifications of the aortic valve. Hepatobiliary: Unremarkable noncontrast appearance of the hepatic parenchyma. Layering sludge and/or cholelithiasis without evidence of acute cholecystitis. No biliary ductal dilation. Pancreas: No pancreatic ductal dilation or evidence of acute inflammation. Spleen: No splenomegaly or focal splenic lesion. Adrenals/Urinary Tract: Bilateral adrenal glands are within normal limits. No hydronephrosis. Vascular calcifications in the bilateral renal hila. No renal, ureteral or bladder calculi identified. Left upper pole renal sinus cysts which are benign and require no follow-up. Urinary bladder is distended with urine, extending above the sacral promontory. Stomach/Bowel: Large hiatal hernia. Stomach is unremarkable for degree of distension. No pathologic dilation of large or small  bowel. Colonic diverticulosis without findings of acute diverticulitis. Moderate rectal stool burden with minimal wall thickening adjacent perirectal fat stranding. Vascular/Lymphatic: Extensive aortic and branch vessel atherosclerosis without abdominal aortic aneurysm. No pathologically enlarged abdominal lymph nodes. Reproductive: Probable prior hysterectomy. Hyperdense nodular area along the left adnexa measures approximally 3.7 cm on image 58/3 possibly reflecting bowel or a pelvic/adnexal lesion. Other: No significant  abdominopelvic free fluid. Musculoskeletal: Prior L4-L5 posterior spinal fusion with advanced multilevel degenerative changes spine. IMPRESSION: 1. Urinary bladder is distended with urine, extending above the sacral promontory. Correlate for urinary retention. 2. No hydronephrosis. 3. Hyperdense nodular area along the left adnexa measures approximally 3.7 cm possibly reflecting bowel or a pelvic/adnexal lesion but incompletely evaluated on this noncontrast examination. Consider further evaluation with pelvic ultrasound. 4. Large hiatal hernia. 5. Colonic diverticulosis without findings of acute diverticulitis. 6. Layering sludge and/or cholelithiasis without evidence of acute cholecystitis. 7.  Aortic Atherosclerosis (ICD10-I70.0). Electronically Signed   By: Dahlia Bailiff M.D.   On: 04/05/2022 10:19   CT Head Wo Contrast  Result Date: 04/05/2022 CLINICAL DATA:  Mental status change, unknown cause EXAM: CT HEAD WITHOUT CONTRAST TECHNIQUE: Contiguous axial images were obtained from the base of the skull through the vertex without intravenous contrast. RADIATION DOSE REDUCTION: This exam was performed according to the departmental dose-optimization program which includes automated exposure control, adjustment of the mA and/or kV according to patient size and/or use of iterative reconstruction technique. COMPARISON:  None Available. FINDINGS: Brain: Normal anatomic configuration. Parenchymal  volume loss is commensurate with the patient's age. Moderate subcortical and periventricular white matter changes are present likely reflecting the sequela of small vessel ischemia. Tiny remote lacunar infarct noted within the right cerebellar hemisphere. No abnormal intra or extra-axial mass lesion or fluid collection. No abnormal mass effect or midline shift. No evidence of acute intracranial hemorrhage or infarct. Ventricular size is normal. Cerebellum unremarkable. Vascular: No asymmetric hyperdense vasculature at the skull base. Skull: Intact Sinuses/Orbits: Paranasal sinuses are clear. Ocular lenses have been removed. Orbits are otherwise unremarkable. Other: Mastoid air cells and middle ear cavities are clear. IMPRESSION: No acute intracranial abnormality.  No calvarial fracture. Electronically Signed   By: Fidela Salisbury M.D.   On: 04/05/2022 01:22   MR BRAIN WO CONTRAST  Result Date: 04/05/2022 CLINICAL DATA:  Delirium.  Unresponsive with pinpoint pupils. EXAM: MRI HEAD WITHOUT CONTRAST TECHNIQUE: Multiplanar, multiecho pulse sequences of the brain and surrounding structures were obtained without intravenous contrast. COMPARISON:  Head CT from earlier today FINDINGS: Brain: Linear increased diffusion signal along the bilateral parietal cortex and faintly seen in the bilateral globus pallidus. No additional areas of edematous cortex to imply background posterior reversible encephalopathy syndrome. There is a background of advanced chronic small vessel ischemia with confluent gliosis in the deep white matter. Small remote right cerebellar infarct. Cerebral volume loss in keeping with age. No hemorrhage, hydrocephalus, or masslike finding Vascular: Major flow voids are preserved. Skull and upper cervical spine: Cervical spine degeneration where covered. No focal marrow lesion. Sinuses/Orbits: Bilateral cataract resection. IMPRESSION: 1. Symmetric restricted diffusion in the parietal cortex and basal  ganglia suggesting global anoxic insult. 2. Extensive chronic small vessel ischemia. Electronically Signed   By: Jorje Guild M.D.   On: 04/05/2022 06:14   DG Chest Port 1 View  Result Date: 04/05/2022 CLINICAL DATA:  Altered mental status. EXAM: PORTABLE CHEST 1 VIEW COMPARISON:  None Available. FINDINGS: Mild cardiomegaly with mild central vascular congestion. No focal consolidation, pleural effusion, pneumothorax. There is moderate size hiatal hernia. Atherosclerotic calcification of the aorta. Degenerative changes of spine. No acute osseous pathology. IMPRESSION: Mild cardiomegaly with mild central vascular congestion. Electronically Signed   By: Anner Crete M.D.   On: 04/05/2022 02:00   DG Abd Portable 1V  Result Date: 04/05/2022 CLINICAL DATA:  MRI clearance. EXAM: PORTABLE ABDOMEN - 1 VIEW COMPARISON:  None Available.  FINDINGS: Posterior spinal fusion hardware noted at L4-5. Linear metallic foreign body overlies the L3-4 interspace. Bowel gas pattern is unremarkable. Bones are diffusely demineralized. Telemetry leads overlie the lower chest and upper abdomen. IMPRESSION: Posterior spinal fusion hardware at L4-5. Additional linear metallic foreign body at the level of the L3-4 interspace indeterminate on frontal projection, possibly a surgical clip. Lateral lumbar spine x-ray could be used to further evaluate as clinically warranted. Electronically Signed   By: Misty Stanley M.D.   On: 04/05/2022 05:26   EEG adult  Result Date: 04/05/2022 Lora Havens, MD     04/05/2022  1:45 PM Patient Name: Holly Valdez MRN: [id number] Epilepsy Attending: Lora Havens Referring Physician/Provider: Lorenza Chick, MD Date: 04/05/2022 Duration: 22.31 mins Patient history: 86 year old woman with a past medical history of dementia and vascular risk factors as above presenting with altered mental status. EEG to evaluate for seizure Level of alertness: Awake AEDs during EEG study: None Technical  aspects: This EEG study was done with scalp electrodes positioned according to the 10-20 International system of electrode placement. Electrical activity was acquired at a sampling rate of 500Hz  and reviewed with a high frequency filter of 70Hz  and a low frequency filter of 1Hz . EEG data were recorded continuously and digitally stored. Description: No posterior dominant rhythm consists of 9-10 Hz activity of moderate voltage (25-35 uV) seen predominantly in posterior head regions, symmetric and reactive to eye opening and eye closing. EEG showed continuous generalized 3 to 6 Hz theta-delta slowing. Hyperventilation and photic stimulation were not performed.   ABNORMALITY - Continuous slow, generalized IMPRESSION: This study is suggestive of moderate diffuse encephalopathy, nonspecific etiology. No seizures or epileptiform discharges were seen throughout the recording. Koppel: BNP (last 3 results) No results for input(s): BNP in the last 8760 hours. Basic Metabolic Panel: Recent Labs  Lab 04/05/22 0048 04/05/22 0056 04/05/22 0057 04/06/22 0254 04/07/22 0508  NA 135 135 136 140 138  K 4.2 4.2 4.2 4.1 4.0  CL 106 103  --  113* 107  CO2 19*  --   --  22 21*  GLUCOSE 240* 254*  --  78 111*  BUN 31* 30*  --  21 12  CREATININE 2.09* 2.10*  --  1.50* 1.31*  CALCIUM 8.6*  --   --  8.7* 8.9  MG  --   --   --  2.0  --    Liver Function Tests: Recent Labs  Lab 04/05/22 0048 04/06/22 0254 04/07/22 0508 04/08/22 0917  AST 22 76* 87* 46*  ALT 17 27 32 26  ALKPHOS 56 50 53 53  BILITOT 0.4 0.5 0.4 0.4  PROT 5.9* 5.8* 6.8 6.7  ALBUMIN 3.0* 2.9* 3.4* 3.1*   No results for input(s): LIPASE, AMYLASE in the last 168 hours. Recent Labs  Lab 04/05/22 0049  AMMONIA 11   CBC: Recent Labs  Lab 04/05/22 0048 04/05/22 0056 04/05/22 0057 04/06/22 0254 04/07/22 0508  WBC 7.6  --   --  8.8 7.0  NEUTROABS 5.4  --   --  6.1  --   HGB 9.7* 10.2* 10.2* 9.6* 11.0*  HCT 30.6* 30.0*  30.0* 29.5* 34.5*  MCV 89.5  --   --  87.0 87.6  PLT 225  --   --  247 239   Cardiac Enzymes: No results for input(s): CKTOTAL, CKMB, CKMBINDEX, TROPONINI in the last 168 hours. BNP: Invalid input(s): POCBNP CBG: Recent Labs  Lab 04/07/22 1607 04/07/22 1938 04/07/22 2208 04/08/22 0624 04/08/22 1131  GLUCAP 237* 212* 177* 155* 326*   D-Dimer No results for input(s): DDIMER in the last 72 hours. Hgb A1c Recent Labs    04/06/22 0254  HGBA1C 9.9*   Lipid Profile No results for input(s): CHOL, HDL, LDLCALC, TRIG, CHOLHDL, LDLDIRECT in the last 72 hours. Thyroid function studies No results for input(s): TSH, T4TOTAL, T3FREE, THYROIDAB in the last 72 hours.  Invalid input(s): FREET3 Anemia work up No results for input(s): VITAMINB12, FOLATE, FERRITIN, TIBC, IRON, RETICCTPCT in the last 72 hours. Urinalysis    Component Value Date/Time   COLORURINE YELLOW 04/05/2022 0101   APPEARANCEUR CLOUDY (A) 04/05/2022 0101   LABSPEC 1.025 04/05/2022 0101   PHURINE 6.0 04/05/2022 0101   GLUCOSEU NEGATIVE 04/05/2022 0101   HGBUR NEGATIVE 04/05/2022 0101   BILIRUBINUR NEGATIVE 04/05/2022 0101   KETONESUR NEGATIVE 04/05/2022 0101   PROTEINUR 30 (A) 04/05/2022 0101   NITRITE NEGATIVE 04/05/2022 0101   LEUKOCYTESUR MODERATE (A) 04/05/2022 0101   Sepsis Labs Invalid input(s): PROCALCITONIN,  WBC,  LACTICIDVEN Microbiology Recent Results (from the past 240 hour(s))  Culture, blood (routine x 2)     Status: None (Preliminary result)   Collection Time: 04/05/22 12:48 AM   Specimen: BLOOD  Result Value Ref Range Status   Specimen Description BLOOD LEFT ANTECUBITAL  Final   Special Requests   Final    BOTTLES DRAWN AEROBIC AND ANAEROBIC Blood Culture results may not be optimal due to an inadequate volume of blood received in culture bottles   Culture   Final    NO GROWTH 3 DAYS Performed at [facility], [place] [address], [place], [place] [address]    Report Status PENDING   Incomplete  Culture, blood (routine x 2)     Status: None (Preliminary result)   Collection Time: 04/05/22 12:55 AM   Specimen: BLOOD  Result Value Ref Range Status   Specimen Description BLOOD SITE NOT SPECIFIED  Final   Special Requests   Final    BOTTLES DRAWN AEROBIC AND ANAEROBIC Blood Culture results may not be optimal due to an inadequate volume of blood received in culture bottles   Culture   Final    NO GROWTH 3 DAYS Performed at [facility], [place] [address], [place], [place] [address]    Report Status PENDING  Incomplete  Urine Culture     Status: Abnormal   Collection Time: 04/05/22  1:01 AM   Specimen: Urine, Catheterized  Result Value Ref Range Status   Specimen Description URINE, CATHETERIZED  Final   Special Requests NONE  Final   Culture (A)  Final    80,000 COLONIES/mL YEAST >=100,000 COLONIES/mL AEROCOCCUS SPECIES Standardized susceptibility testing for this organism is not available. Performed at [facility], [place] [address] [place],  [address]    Report Status 04/06/2022 FINAL  Final     Time coordinating discharge: 35 minutes  SIGNED: Antonieta Pert, MD  [facility] 04/08/2022, 11:36 AM  If 7PM-7AM, please contact night-coverage [email or web address]

## 2022-04-08 NOTE — Plan of Care (Signed)
Neurology plan of care  Evaluated patient and she seems stable, pleasantly confused, oriented to self, interacts appropriately and consistently. Her baseline cognitive function is not clear and CO2 toxicity has been ruled out. She was admitted with multiple active medical conditions and suspect multifactorial etiology. The cause of restricted diffusion on Imaging is not clear as documentation regarding timeline and home events are not available. In light of her continued improvement recommend outpatient neurology follow up with Dr. Wells Guiles Tat at [facility] for repeat MRI brain.  Please call for questions.   Electronically signed by:  Lynnae Sandhoff, MD Page: [phone] 04/08/2022, 8:26 AM

## 2022-04-08 NOTE — TOC Transition Note (Signed)
Transition of Care [facility]) - CM/SW Discharge Note   Patient Details  Name: Holly Valdez MRN: [id number] Date of Birth: 05-15-1936  Transition of Care [facility]) CM/SW Contact:  Geralynn Ochs, LCSW Phone Number: 04/08/2022, 12:38 PM   Clinical Narrative:   Patient from [facility], will return. CSW sent discharge information and confirmed patient is ready for transition back. CSW updated daughter via phone, she was appreciative of update. Transport scheduled with [facility] for next available.  Nurse to call report to [phone].    Final next level of care: Skilled Nursing Facility Barriers to Discharge: Barriers Resolved   Patient Goals and CMS Choice Patient states their goals for this hospitalization and ongoing recovery are:: patient unable to participate in goal setting, not oriented CMS Medicare.gov Compare Post Acute Care list provided to:: Patient Represenative (must comment) Choice offered to / list presented to : Adult Children  Discharge Placement              Patient chooses bed at:  [facility]) Patient to be transferred to facility by: [place] Name of family member notified: Sherwyn Patient and family notified of of transfer: 04/08/22  Discharge Plan and Services                                     Social Determinants of Health (SDOH) Interventions     Readmission Risk Interventions    10/14/2020   10:15 AM  Readmission Risk Prevention Plan  Post Dischage Appt Complete  Medication Screening Complete  Transportation Screening Complete

## 2022-04-08 NOTE — Plan of Care (Signed)
Pt is still hallucinating but calm and looks pleasant. Eating and drinking well. No complaints during the shift.    Problem: Fluid Volume: Goal: Hemodynamic stability will improve Outcome: Progressing   Problem: Clinical Measurements: Goal: Diagnostic test results will improve Outcome: Progressing Goal: Signs and symptoms of infection will decrease Outcome: Progressing   Problem: Health Behavior/Discharge Planning: Goal: Ability to manage health-related needs will improve Outcome: Progressing   Problem: Clinical Measurements: Goal: Ability to maintain clinical measurements within normal limits will improve Outcome: Progressing   Problem: Activity: Goal: Risk for activity intolerance will decrease Outcome: Progressing   Problem: Nutrition: Goal: Adequate nutrition will be maintained Outcome: Progressing   Problem: Safety: Goal: Ability to remain free from injury will improve Outcome: Progressing

## 2022-04-08 NOTE — Progress Notes (Signed)
Pt son Mr. Yousra Ivens wants an update about her mother from attending MD if possible. Contact number [phone]

## 2022-04-08 NOTE — Care Management Important Message (Signed)
Important Message  Patient Details  Name: Holly Valdez MRN: [id number] Date of Birth: 03-24-1936   Medicare Important Message Given:  Yes     [name] [name] 04/08/2022, 3:18 PM

## 2022-04-10 LAB — CULTURE, BLOOD (ROUTINE X 2)
Culture: NO GROWTH
Culture: NO GROWTH

## 2022-04-22 ENCOUNTER — Inpatient Hospital Stay (HOSPITAL_COMMUNITY)
Admission: EM | Admit: 2022-04-22 | Discharge: 2022-04-26 | DRG: 071 | Disposition: A | Discharge status: Skilled Nursing Facility | Payer: Medicare Other | Source: Skilled Nursing Facility | Attending: Student | Admitting: Student

## 2022-04-22 ENCOUNTER — Other Ambulatory Visit: Payer: Self-pay

## 2022-04-22 ENCOUNTER — Encounter (HOSPITAL_COMMUNITY): Payer: Self-pay | Admitting: *Deleted

## 2022-04-22 ENCOUNTER — Observation Stay (HOSPITAL_COMMUNITY): Payer: Medicare Other

## 2022-04-22 ENCOUNTER — Emergency Department (HOSPITAL_COMMUNITY): Payer: Medicare Other

## 2022-04-22 ENCOUNTER — Other Ambulatory Visit (HOSPITAL_COMMUNITY): Payer: Medicare Other

## 2022-04-22 DIAGNOSIS — D631 Anemia in chronic kidney disease: Secondary | ICD-10-CM | POA: Diagnosis present

## 2022-04-22 DIAGNOSIS — G931 Anoxic brain damage, not elsewhere classified: Secondary | ICD-10-CM | POA: Diagnosis present

## 2022-04-22 DIAGNOSIS — G9340 Encephalopathy, unspecified: Secondary | ICD-10-CM | POA: Diagnosis not present

## 2022-04-22 DIAGNOSIS — Z7189 Other specified counseling: Secondary | ICD-10-CM | POA: Diagnosis not present

## 2022-04-22 DIAGNOSIS — E1122 Type 2 diabetes mellitus with diabetic chronic kidney disease: Secondary | ICD-10-CM | POA: Diagnosis present

## 2022-04-22 DIAGNOSIS — Z880 Allergy status to penicillin: Secondary | ICD-10-CM

## 2022-04-22 DIAGNOSIS — B3749 Other urogenital candidiasis: Secondary | ICD-10-CM | POA: Diagnosis present

## 2022-04-22 DIAGNOSIS — Z66 Do not resuscitate: Secondary | ICD-10-CM | POA: Diagnosis present

## 2022-04-22 DIAGNOSIS — Z87891 Personal history of nicotine dependence: Secondary | ICD-10-CM

## 2022-04-22 DIAGNOSIS — N1832 Chronic kidney disease, stage 3b: Secondary | ICD-10-CM | POA: Diagnosis not present

## 2022-04-22 DIAGNOSIS — I1310 Hypertensive heart and chronic kidney disease without heart failure, with stage 1 through stage 4 chronic kidney disease, or unspecified chronic kidney disease: Secondary | ICD-10-CM | POA: Diagnosis present

## 2022-04-22 DIAGNOSIS — R6510 Systemic inflammatory response syndrome (SIRS) of non-infectious origin without acute organ dysfunction: Secondary | ICD-10-CM | POA: Diagnosis present

## 2022-04-22 DIAGNOSIS — Z794 Long term (current) use of insulin: Secondary | ICD-10-CM

## 2022-04-22 DIAGNOSIS — Z20822 Contact with and (suspected) exposure to covid-19: Secondary | ICD-10-CM | POA: Diagnosis present

## 2022-04-22 DIAGNOSIS — E1140 Type 2 diabetes mellitus with diabetic neuropathy, unspecified: Secondary | ICD-10-CM | POA: Diagnosis present

## 2022-04-22 DIAGNOSIS — F03C18 Unspecified dementia, severe, with other behavioral disturbance: Secondary | ICD-10-CM | POA: Diagnosis present

## 2022-04-22 DIAGNOSIS — G9341 Metabolic encephalopathy: Principal | ICD-10-CM | POA: Diagnosis present

## 2022-04-22 DIAGNOSIS — E1165 Type 2 diabetes mellitus with hyperglycemia: Secondary | ICD-10-CM | POA: Diagnosis present

## 2022-04-22 DIAGNOSIS — Z515 Encounter for palliative care: Secondary | ICD-10-CM

## 2022-04-22 DIAGNOSIS — E871 Hypo-osmolality and hyponatremia: Secondary | ICD-10-CM | POA: Diagnosis not present

## 2022-04-22 DIAGNOSIS — Z6832 Body mass index (BMI) 32.0-32.9, adult: Secondary | ICD-10-CM

## 2022-04-22 DIAGNOSIS — R296 Repeated falls: Secondary | ICD-10-CM | POA: Diagnosis present

## 2022-04-22 DIAGNOSIS — N189 Chronic kidney disease, unspecified: Secondary | ICD-10-CM | POA: Diagnosis present

## 2022-04-22 DIAGNOSIS — Z79899 Other long term (current) drug therapy: Secondary | ICD-10-CM

## 2022-04-22 DIAGNOSIS — N390 Urinary tract infection, site not specified: Secondary | ICD-10-CM

## 2022-04-22 DIAGNOSIS — F03918 Unspecified dementia, unspecified severity, with other behavioral disturbance: Secondary | ICD-10-CM | POA: Diagnosis present

## 2022-04-22 DIAGNOSIS — E669 Obesity, unspecified: Secondary | ICD-10-CM | POA: Diagnosis present

## 2022-04-22 DIAGNOSIS — Z8616 Personal history of COVID-19: Secondary | ICD-10-CM

## 2022-04-22 LAB — URINALYSIS, ROUTINE W REFLEX MICROSCOPIC
Bilirubin Urine: NEGATIVE
Glucose, UA: >=500 mg/dL — AB
Hgb urine dipstick: NEGATIVE
Ketones, ur: NEGATIVE mg/dL
Nitrite: NEGATIVE
Protein, ur: 100 mg/dL — AB
Specific Gravity, Urine: 1.02 (ref 1.005–1.030)
WBC, UA: >50 WBC/hpf — ABNORMAL HIGH (ref 0–5)
pH: 6 (ref 5.0–8.0)

## 2022-04-22 LAB — COMPREHENSIVE METABOLIC PANEL
ALT: 17 U/L (ref 0–44)
AST: 19 U/L (ref 15–41)
Albumin: 3.4 g/dL — ABNORMAL LOW (ref 3.5–5.0)
Alkaline Phosphatase: 58 U/L (ref 38–126)
Anion gap: 9 (ref 5–15)
BUN: 16 mg/dL (ref 8–23)
CO2: 25 mmol/L (ref 22–32)
Calcium: 9.3 mg/dL (ref 8.9–10.3)
Chloride: 101 mmol/L (ref 98–111)
Creatinine, Ser: 1.38 mg/dL — ABNORMAL HIGH (ref 0.44–1.00)
GFR, Estimated: 38 mL/min — ABNORMAL LOW (ref >60)
Glucose, Bld: 275 mg/dL — ABNORMAL HIGH (ref 70–99)
Potassium: 4.6 mmol/L (ref 3.5–5.1)
Sodium: 135 mmol/L (ref 135–145)
Total Bilirubin: 0.6 mg/dL (ref 0.3–1.2)
Total Protein: 6.9 g/dL (ref 6.5–8.1)

## 2022-04-22 LAB — PROTIME-INR
INR: 1.1 (ref 0.8–1.2)
Prothrombin Time: 14.2 seconds (ref 11.4–15.2)

## 2022-04-22 LAB — CBC WITH DIFFERENTIAL/PLATELET
Abs Immature Granulocytes: 0.06 10*3/uL (ref 0.00–0.07)
Basophils Absolute: 0 10*3/uL (ref 0.0–0.1)
Basophils Relative: 0 %
Eosinophils Absolute: 0 10*3/uL (ref 0.0–0.5)
Eosinophils Relative: 0 %
HCT: 36.1 % (ref 36.0–46.0)
Hemoglobin: 11.7 g/dL — ABNORMAL LOW (ref 12.0–15.0)
Immature Granulocytes: 1 %
Lymphocytes Relative: 8 %
Lymphs Abs: 0.8 10*3/uL (ref 0.7–4.0)
MCH: 28.1 pg (ref 26.0–34.0)
MCHC: 32.4 g/dL (ref 30.0–36.0)
MCV: 86.8 fL (ref 80.0–100.0)
Monocytes Absolute: 0.7 10*3/uL (ref 0.1–1.0)
Monocytes Relative: 7 %
Neutro Abs: 8.3 10*3/uL — ABNORMAL HIGH (ref 1.7–7.7)
Neutrophils Relative %: 84 %
Platelets: 298 10*3/uL (ref 150–400)
RBC: 4.16 MIL/uL (ref 3.87–5.11)
RDW: 14.1 % (ref 11.5–15.5)
WBC: 9.9 10*3/uL (ref 4.0–10.5)
nRBC: 0 % (ref 0.0–0.2)

## 2022-04-22 LAB — CBG MONITORING, ED: Glucose-Capillary: 223 mg/dL — ABNORMAL HIGH (ref 70–99)

## 2022-04-22 LAB — VITAMIN B12: Vitamin B-12: 601 pg/mL (ref 180–914)

## 2022-04-22 LAB — RESP PANEL BY RT-PCR (FLU A&B, COVID) ARPGX2
Influenza A by PCR: NEGATIVE
Influenza B by PCR: NEGATIVE
SARS Coronavirus 2 by RT PCR: NEGATIVE

## 2022-04-22 LAB — APTT: aPTT: 28 seconds (ref 24–36)

## 2022-04-22 LAB — AMMONIA: Ammonia: <10 umol/L (ref 9–35)

## 2022-04-22 LAB — LACTIC ACID, PLASMA: Lactic Acid, Venous: 1.2 mmol/L (ref 0.5–1.9)

## 2022-04-22 LAB — LIPASE, BLOOD: Lipase: 35 U/L (ref 11–51)

## 2022-04-22 LAB — GLUCOSE, CAPILLARY: Glucose-Capillary: 239 mg/dL — ABNORMAL HIGH (ref 70–99)

## 2022-04-22 LAB — MRSA NEXT GEN BY PCR, NASAL: MRSA by PCR Next Gen: NOT DETECTED

## 2022-04-22 IMAGING — MR MR HEAD W/O CM
6 of 10 series · 27 of 48 positions shown · non-contrast
Comparison: [DATE]

CLINICAL DATA: Altered mental status

EXAM:
MRI HEAD WITHOUT CONTRAST
TECHNIQUE: Multiplanar, multiecho pulse sequences of the brain and surrounding
structures were obtained without intravenous contrast.

[Series 2: DWI · axial · 3.0mm · 0.94mm/px · z∈[-76,+67]mm · 8 of 97 slices shown (1 of 2)]
[im 1/97]
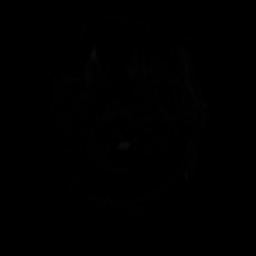
[im 11/97]
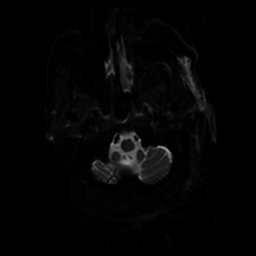
[im 33/97]
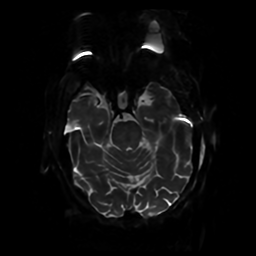
[im 43/97]
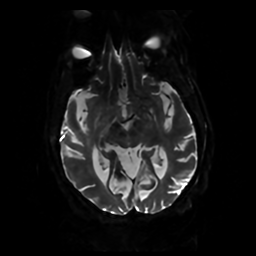
[im 54/97]
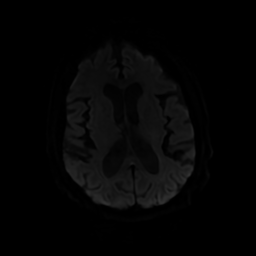
[im 65/97]
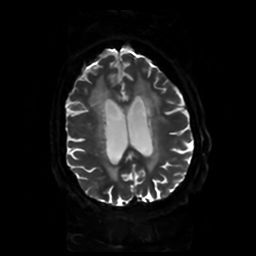
[im 86/97]
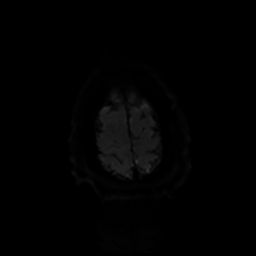
[im 97/97]
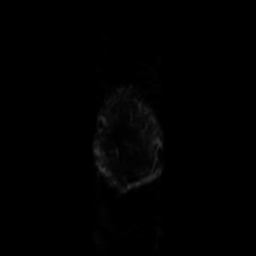

[Series 3: DWI · coronal · 4.0mm · 0.94mm/px · 7 of 72 slices shown (2 of 2)]
[im 1/72]
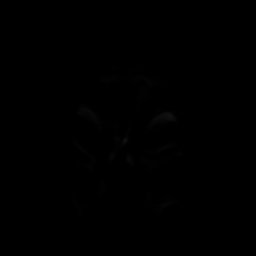
[im 12/72]
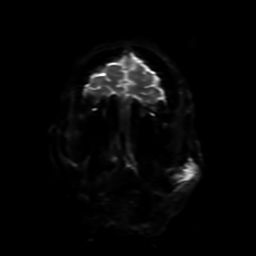
[im 24/72]
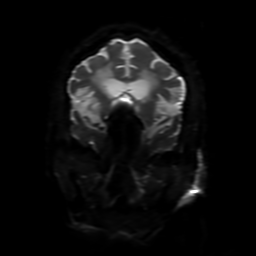
[im 36/72]
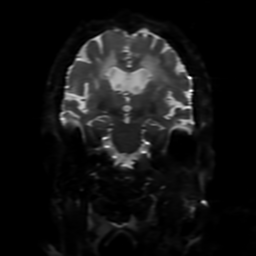
[im 48/72]
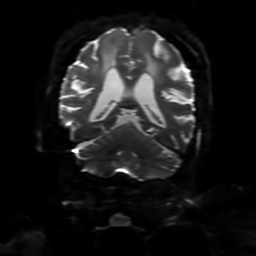
[im 60/72]
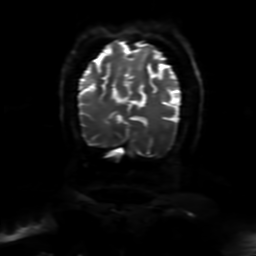
[im 72/72]
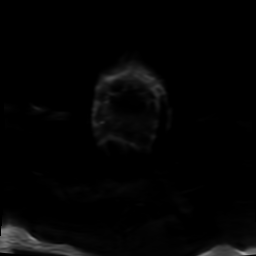

[Series 4: FLAIR · sagittal · 5.0mm · 0.23mm/px · 2 of 24 slices shown (1 of 2)]
[im 1/24]
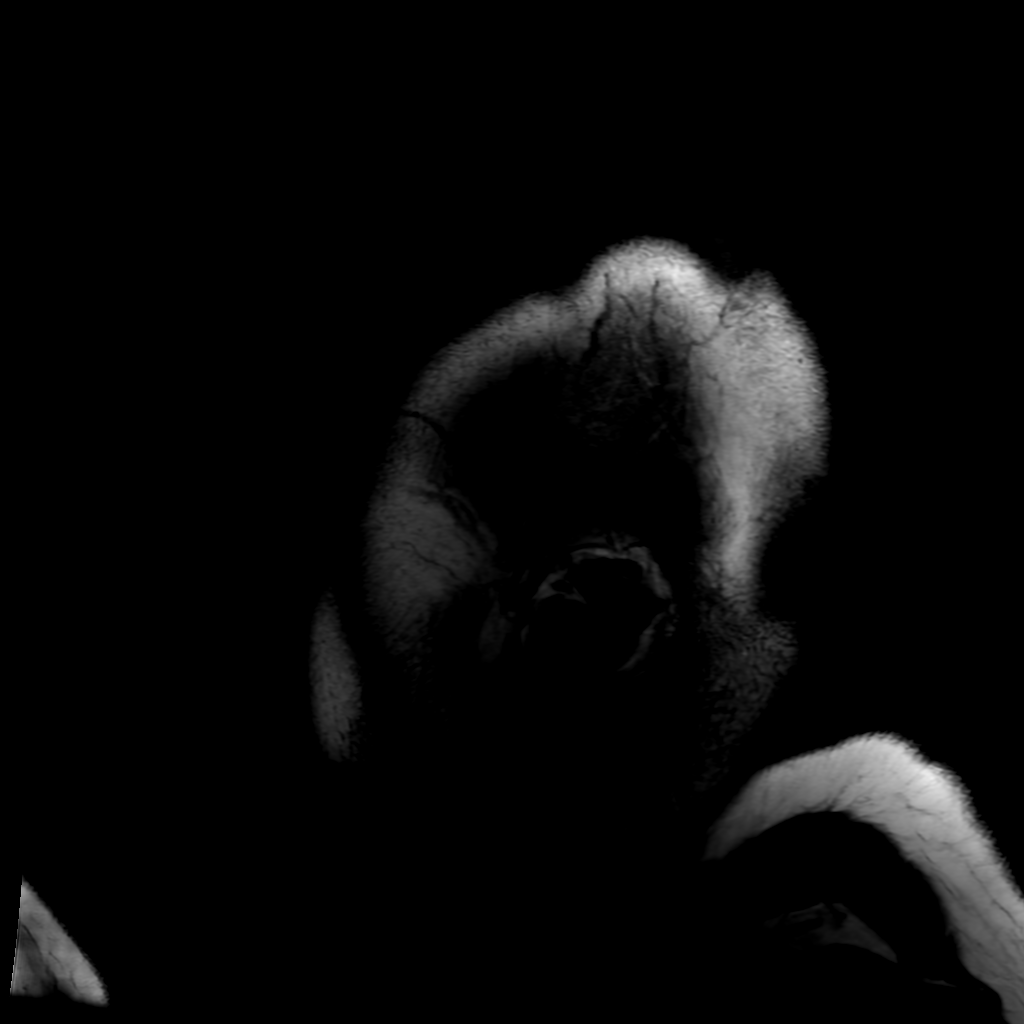
[im 24/24]
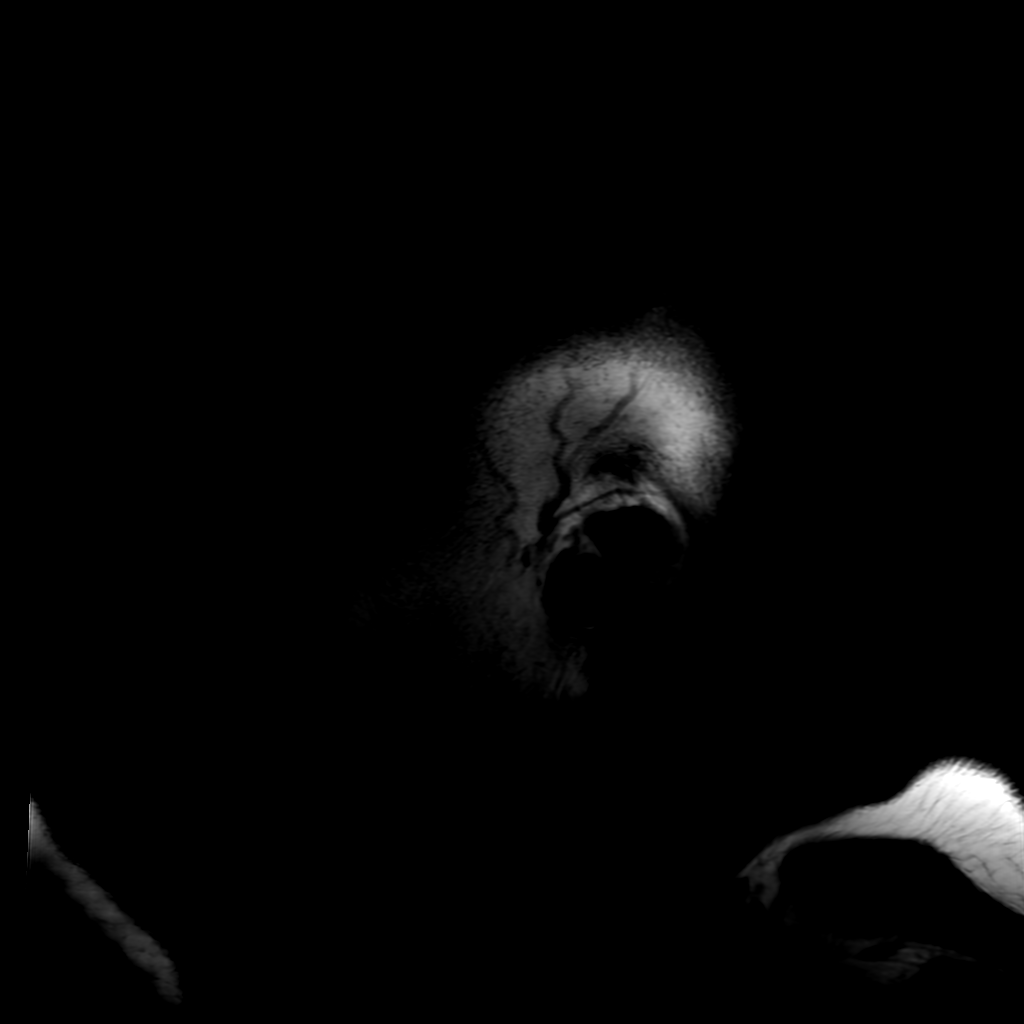

[Series 6: FLAIR · axial · 4.0mm · 0.45mm/px · z∈[-65,+84]mm · 3 of 35 slices shown (2 of 2)]
[im 1/35]
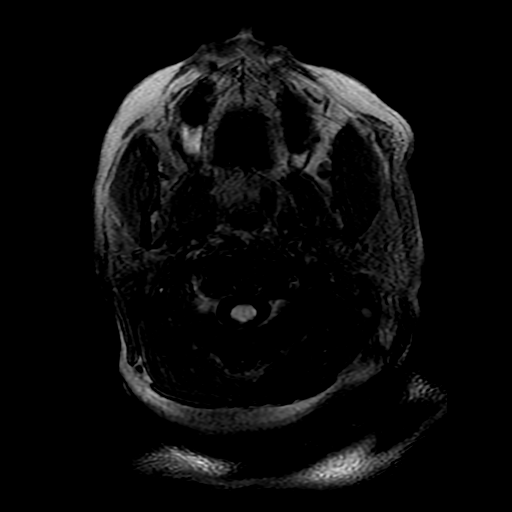
[im 18/35]
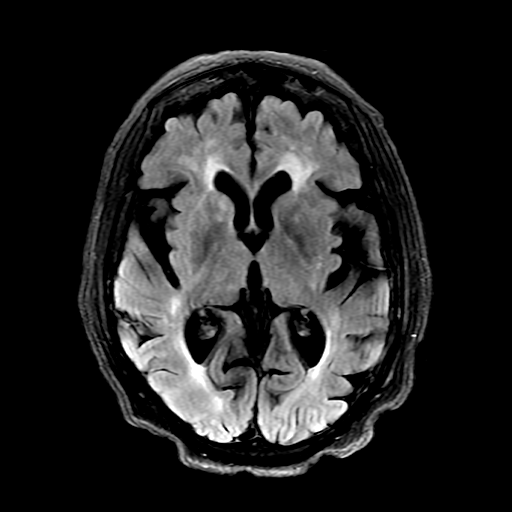
[im 35/35]
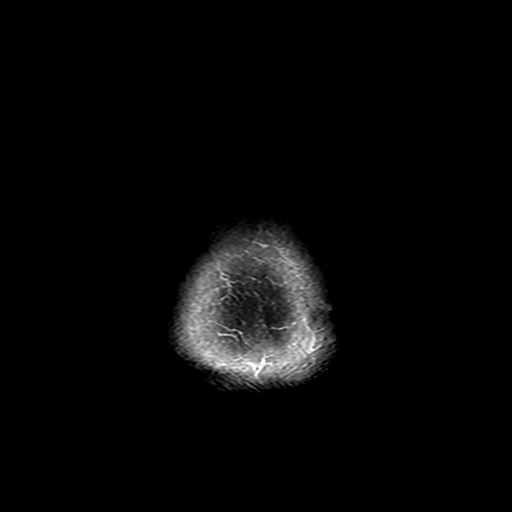

[Series 250: ADC · axial · 3.0mm · 0.94mm/px · z∈[-76,+67]mm · 4 of 49 slices shown (1 of 2)]
[im 1/49]
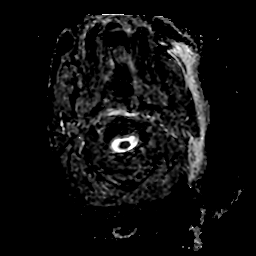
[im 17/49]
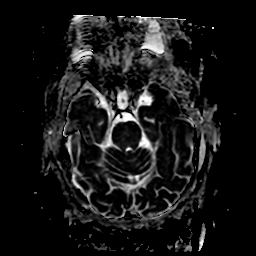
[im 33/49]
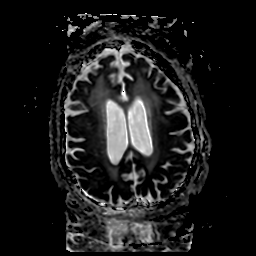
[im 49/49]
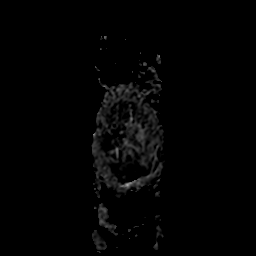

[Series 350: ADC · coronal · 4.0mm · 0.94mm/px · 3 of 34 slices shown (2 of 2)]
[im 1/34]
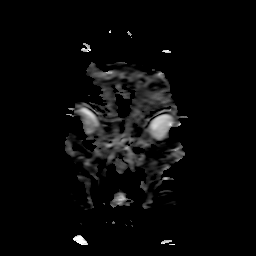
[im 17/34]
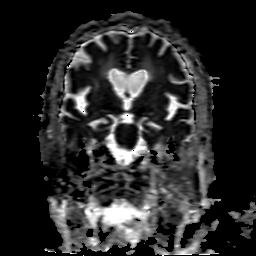
[im 34/34]
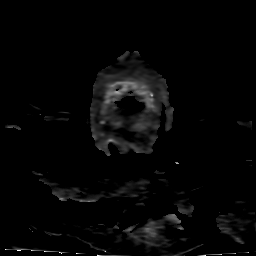

[27 of 48 positions shown; findings below may reference images not displayed]

FINDINGS: Brain: No acute infarct, mass effect or extra-axial collection. No
acute or chronic hemorrhage. There is multifocal hyperintense
T2-weighted signal within the white matter. Generalized cerebral
volume loss. Old, small right cerebellar infarct. The midline
structures are normal.

Vascular: Major flow voids are preserved.

Skull and upper cervical spine: Normal calvarium and skull base.
Visualized upper cervical spine and soft tissues are normal.

Sinuses/Orbits:No paranasal sinus fluid levels or advanced mucosal
thickening. No mastoid or middle ear effusion. Normal orbits.
IMPRESSION: 1. No acute intracranial abnormality.
2. Generalized cerebral volume loss and findings of chronic ischemic
microangiopathy.

## 2022-04-22 IMAGING — DX DG CHEST 1V PORT
1 series · 1 of 1 positions shown · non-contrast
Comparison: [DATE]

CLINICAL DATA: Altered mental status

EXAM:
PORTABLE CHEST 1 VIEW

[chest ap]
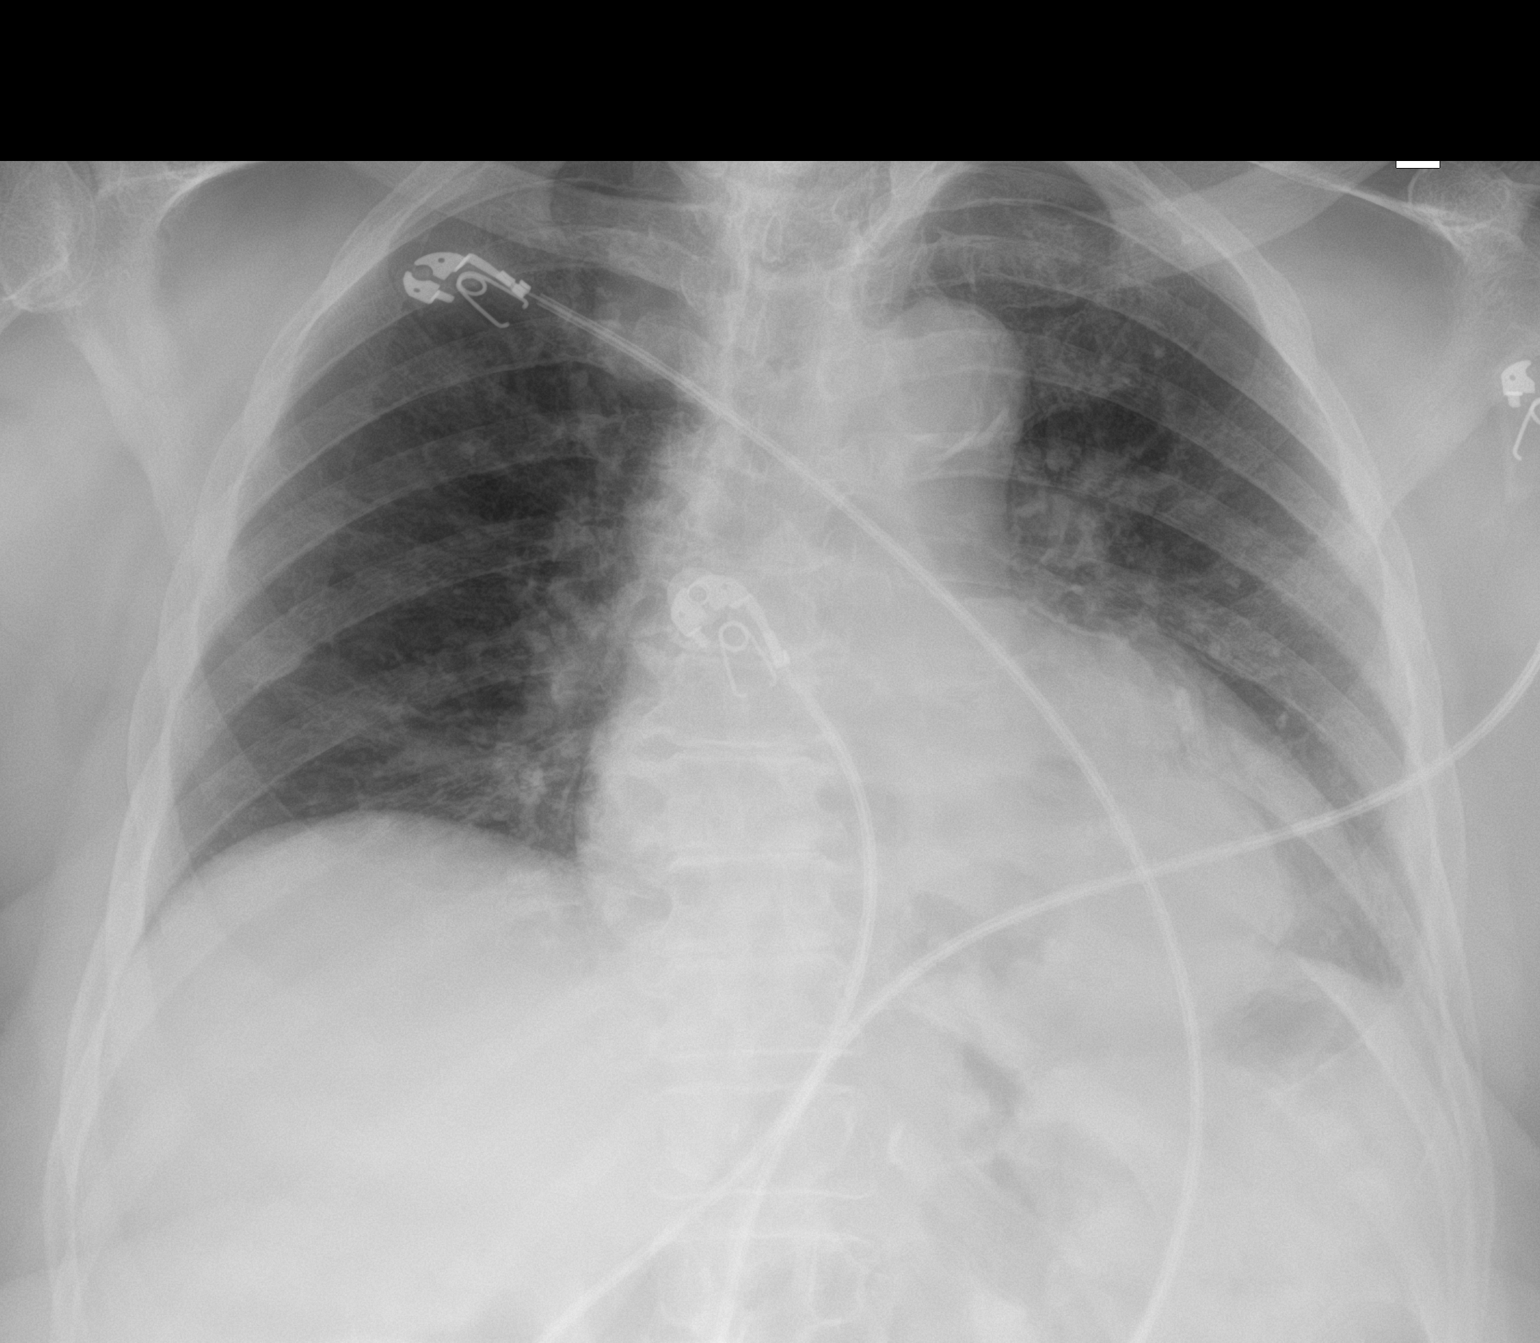

[1 of 1 positions shown; findings below may reference images not displayed]

FINDINGS: Transverse diameter of heart is increased. There are no signs of
alveolar pulmonary edema. There is blunting of left lateral CP
angle. Radiolucency overlying the cardiac shadow most likely
suggests fixed hiatal hernia.
IMPRESSION: Cardiomegaly. There are no signs of pulmonary edema or focal
pulmonary consolidation. Blunting of left lateral CP angle may be
due to pleural thickening or minimal effusion. Possible fixed hiatal
hernia.

## 2022-04-22 IMAGING — CT CT HEAD W/O CM
3 of 6 series · 14 of 47 positions shown, 16 images · non-contrast
Comparison: MRI brain and CT head dated [DATE].

CLINICAL DATA: Delirium.



[Series 3: head 5.0 h30s · axial · 0.43mm/px · z∈[-112,+23]mm · 8 of 33 slices shown, 10 images]
[im 3/33  brain]
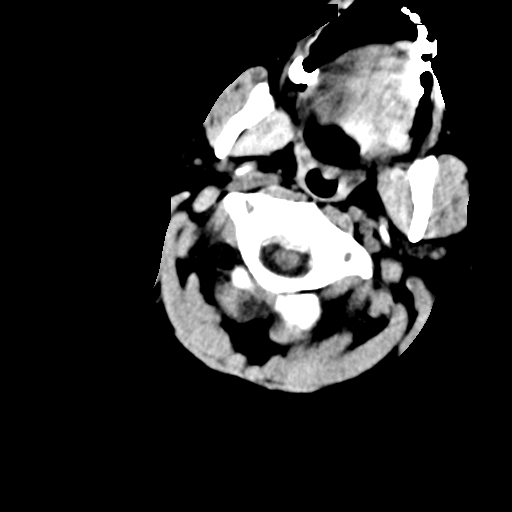
[im 3/33  bone]
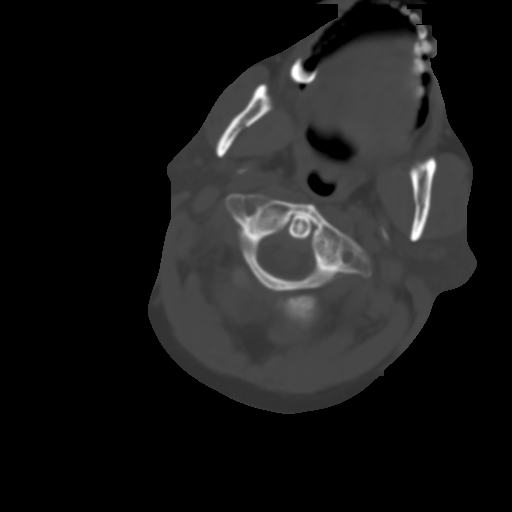
[im 7/33  brain]
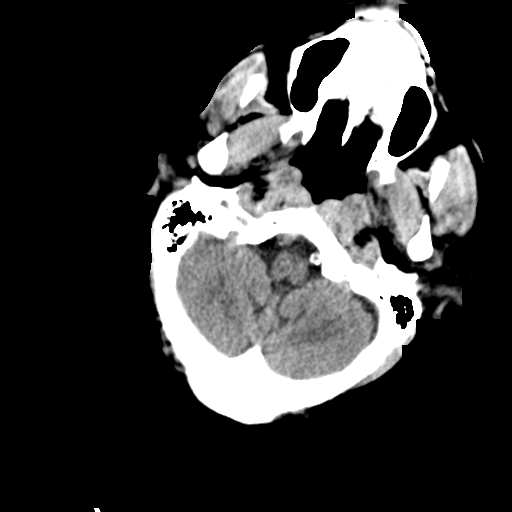
[im 12/33  brain]
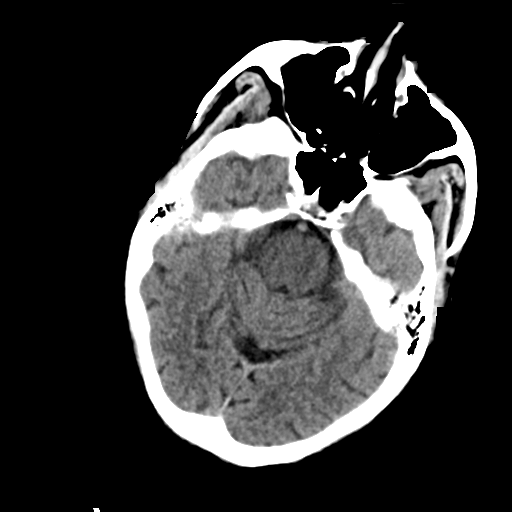
[im 14/33  brain]
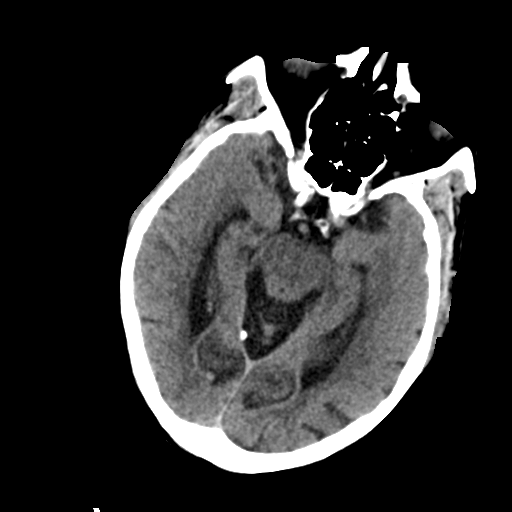
[im 19/33  brain]
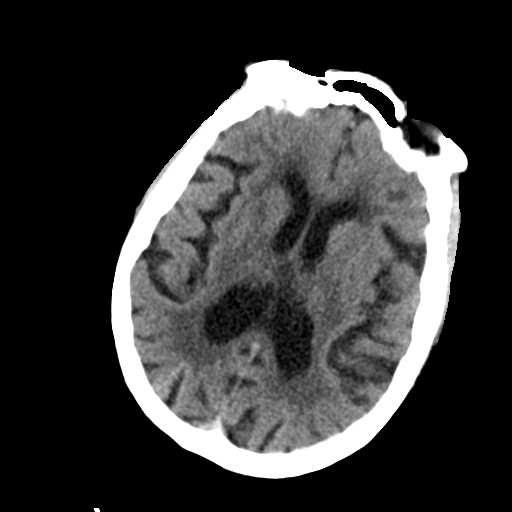
[im 19/33  bone]
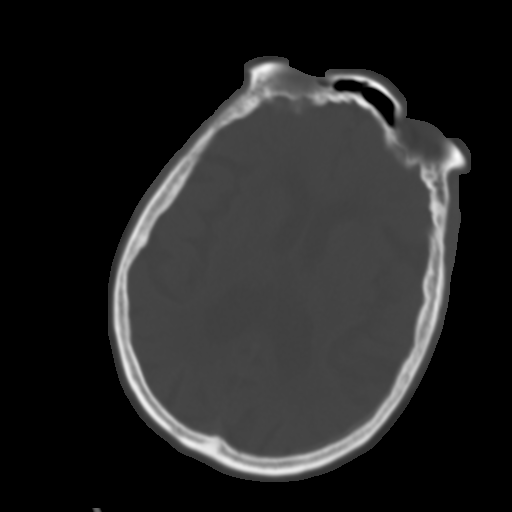
[im 21/33  brain]
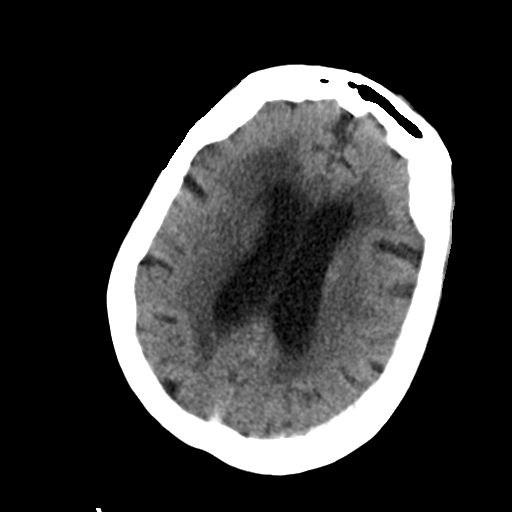
[im 26/33  brain]
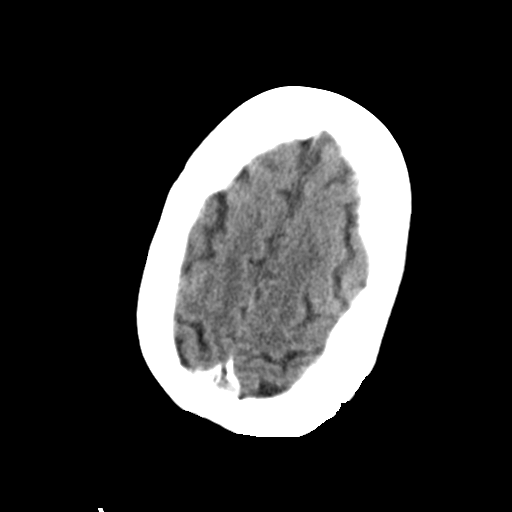
[im 30/33  brain]
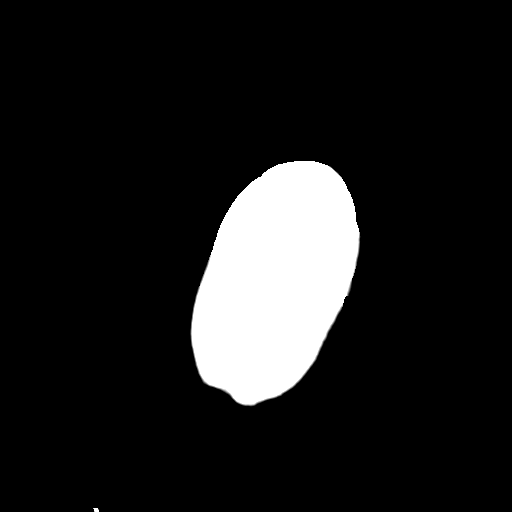

[Series 5: head 3.0 mpr cor · coronal · 0.31mm/px · 3 of 66 slices shown]
[im 17/66  brain]
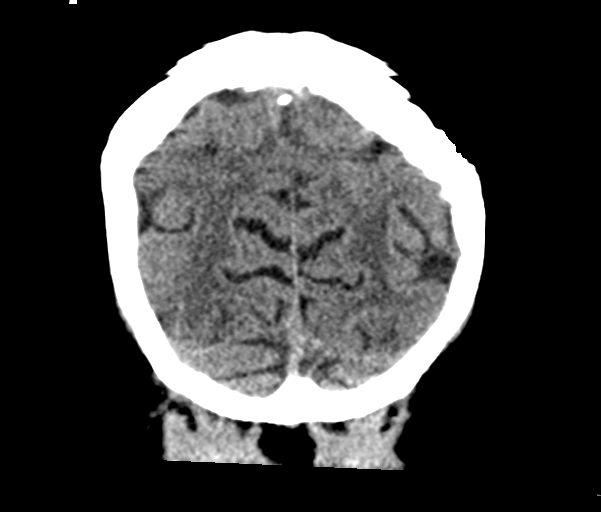
[im 33/66  brain]
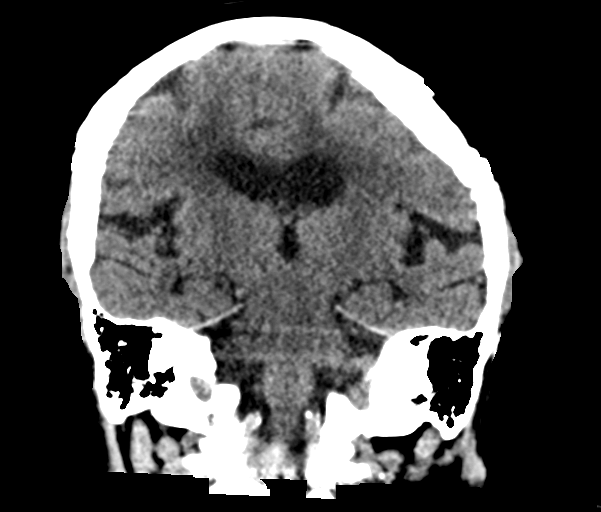
[im 49/66  brain]
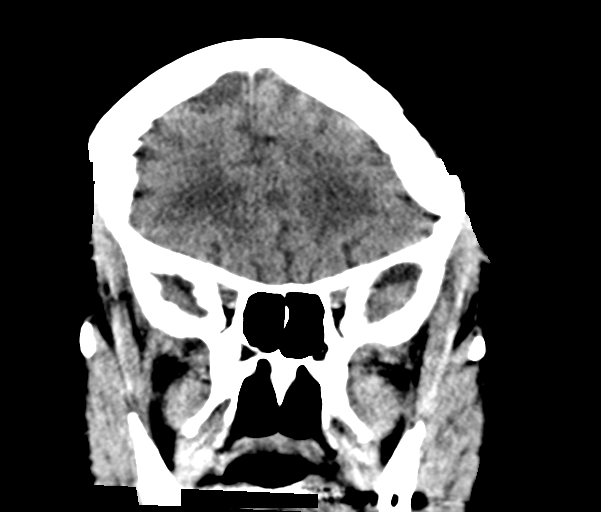

[Series 6: head 3.0 mpr sag · sagittal · 0.31mm/px · 3 of 67 slices shown]
[im 5/67  brain]
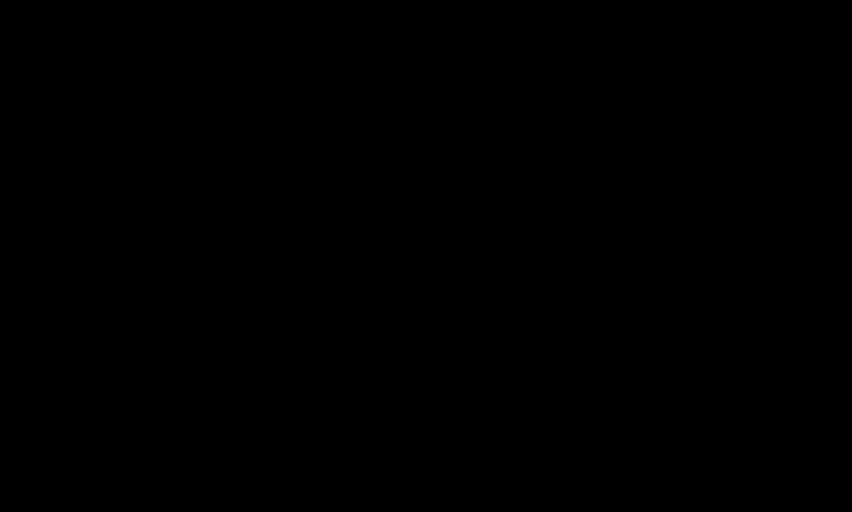
[im 25/67  brain]
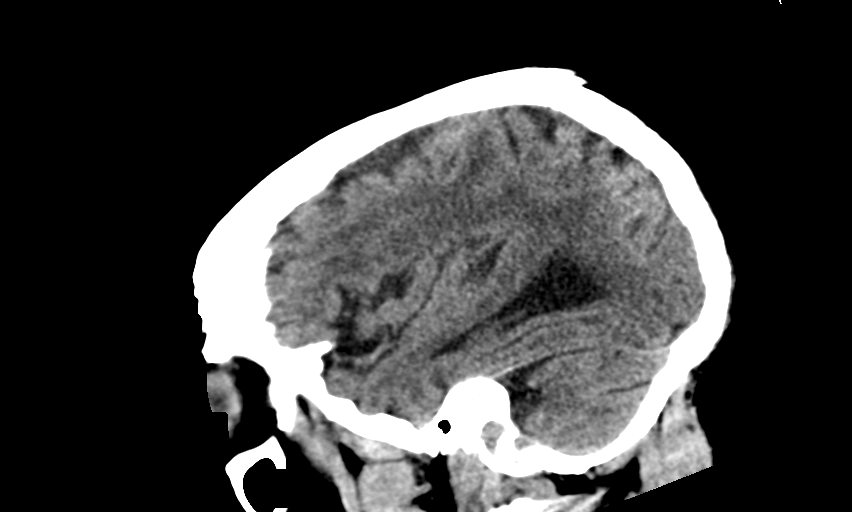
[im 46/67  brain]
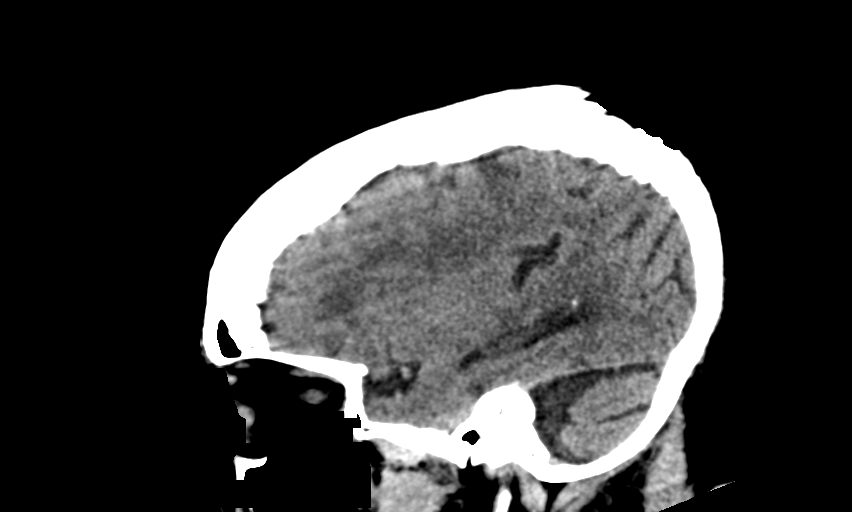

[14 of 47 positions shown; findings below may reference images not displayed]

FINDINGS: Brain: No evidence of acute infarction, hemorrhage, hydrocephalus,
extra-axial collection or mass lesion/mass effect. Stable atrophy
and severe chronic microvascular ischemic changes.

Vascular: Calcified atherosclerosis at the skull base. No hyperdense
vessel.

Skull: Normal. Negative for fracture or focal lesion.

Sinuses/Orbits: No acute finding.

Other: None.
IMPRESSION: 1. No acute intracranial abnormality.
2. Stable atrophy and severe chronic microvascular ischemic changes.

## 2022-04-22 MED ORDER — SODIUM CHLORIDE 0.9 % IV SOLN
2.0000 g | Freq: Two times a day (BID) | INTRAVENOUS | Status: DC
  Administered 2022-04-22: 2 g via INTRAVENOUS
  Filled 2022-04-22: qty 12.5

## 2022-04-22 MED ORDER — ACETAMINOPHEN 325 MG PO TABS: 650.0000 mg | ORAL_TABLET | Freq: Four times a day (QID) | ORAL | Status: DC | PRN

## 2022-04-22 MED ORDER — SODIUM CHLORIDE 0.9 % IV SOLN: 2.0000 g | INTRAVENOUS | Status: DC

## 2022-04-22 MED ORDER — ONDANSETRON HCL 4 MG/2ML IJ SOLN: 4.0000 mg | Freq: Four times a day (QID) | INTRAMUSCULAR | Status: DC | PRN

## 2022-04-22 MED ORDER — LACTATED RINGERS IV BOLUS (SEPSIS)
1000.0000 mL | Freq: Once | INTRAVENOUS | Status: AC
Start: 2022-04-22 — End: 2022-04-22
  Administered 2022-04-22: 1000 mL via INTRAVENOUS

## 2022-04-22 MED ORDER — SODIUM CHLORIDE 0.9 % IV SOLN: 2.0000 g | Freq: Once | INTRAVENOUS | Status: DC

## 2022-04-22 MED ORDER — VANCOMYCIN HCL 1750 MG/350ML IV SOLN
1750.0000 mg | Freq: Once | INTRAVENOUS | Status: AC
  Administered 2022-04-22: 1750 mg via INTRAVENOUS
  Filled 2022-04-22: qty 350

## 2022-04-22 MED ORDER — SODIUM CHLORIDE 0.9 % IV SOLN
2.0000 g | Freq: Two times a day (BID) | INTRAVENOUS | Status: DC
  Administered 2022-04-22 – 2022-04-23 (×3): 2 g via INTRAVENOUS
  Filled 2022-04-22 (×4): qty 20

## 2022-04-22 MED ORDER — ONDANSETRON HCL 4 MG PO TABS: 4.0000 mg | ORAL_TABLET | Freq: Four times a day (QID) | ORAL | Status: DC | PRN

## 2022-04-22 MED ORDER — VANCOMYCIN HCL 1750 MG/350ML IV SOLN
1750.0000 mg | INTRAVENOUS | Status: DC
  Filled 2022-04-22: qty 350

## 2022-04-22 MED ORDER — ACETAMINOPHEN 650 MG RE SUPP
650.0000 mg | Freq: Four times a day (QID) | RECTAL | Status: DC | PRN
  Administered 2022-04-22: 650 mg via RECTAL
  Filled 2022-04-22: qty 1

## 2022-04-22 MED ORDER — HEPARIN SODIUM (PORCINE) 5000 UNIT/ML IJ SOLN
5000.0000 [IU] | Freq: Three times a day (TID) | INTRAMUSCULAR | Status: DC
  Administered 2022-04-23 – 2022-04-26 (×11): 5000 [IU] via SUBCUTANEOUS
  Filled 2022-04-22 (×11): qty 1

## 2022-04-22 MED ORDER — INSULIN ASPART 100 UNIT/ML IJ SOLN
0.0000 [IU] | Freq: Every day | INTRAMUSCULAR | Status: DC
  Administered 2022-04-22: 2 [IU] via SUBCUTANEOUS

## 2022-04-22 MED ORDER — INSULIN ASPART 100 UNIT/ML IJ SOLN
0.0000 [IU] | Freq: Three times a day (TID) | INTRAMUSCULAR | Status: DC
  Administered 2022-04-23 – 2022-04-24 (×4): 2 [IU] via SUBCUTANEOUS

## 2022-04-22 MED ORDER — DEXTROSE 5 % IV SOLN
10.0000 mg/kg | INTRAVENOUS | Status: DC
  Administered 2022-04-23 – 2022-04-24 (×3): 605 mg via INTRAVENOUS
  Filled 2022-04-22 (×5): qty 12.1

## 2022-04-22 MED ORDER — LACTATED RINGERS IV SOLN: INTRAVENOUS | Status: DC

## 2022-04-22 NOTE — Assessment & Plan Note (Signed)
A1c 9.9% 2 weeks ago.  She has hyperglycemia but not in hyperglycemic crisis. -CBG monitoring and SSI-sensitive while n.p.o.

## 2022-04-22 NOTE — Assessment & Plan Note (Signed)
Patient is DNR/DNI.  She has MOST form at bedside.  Discussed about inevitable recurrent hospitalization given severe dementia and other comorbidities with patient's son over the phone.  I broached about hospice if she does not improve over the next 24 hours.  I also offered palliative care consult.  He appreciated the call and information. -Palliative medicine consulted.

## 2022-04-22 NOTE — Assessment & Plan Note (Signed)
Seems to be very advanced.  See encephalopathy

## 2022-04-22 NOTE — Assessment & Plan Note (Addendum)
Recently hospitalized for the same when she was treated for UTI and dehydration with improvement in her mental status and discharged to SNF.  At that time, she was back to "baseline". Per patient's son, she was able to talk few and few days self when he saw her about a week ago.  She has significant fever.  She has pyuria with few bacteria on UA but no suprapubic tenderness or CVA tenderness.  Ammonia within normal.  She seems to be on gabapentin but not significantly high dose.  Head neck looks stiff but not sure if this is new.  Does not appear to have focal neurodeficit.  Difficult to exclude seizure activity.  His CT head did not show any acute finding.  MRSA PCR screen negative.  Cultures drawn in ED.  Started on vancomycin and cefepime. -Fluoroscopy guided LP requested -Continue broad-spectrum antibiotics with vancomycin and ceftriaxone -Cannot use ampicillin due to severe penicillin allergy. -We will add IV acyclovir -Follow cultures -Check EEG -MRI brain for follow-up on possible ABI which was recommended previous hospitalization -Palliative medicine consulted for further goals of care discussion given recurrent hospitalization -N.p.o. pending SLP eval -Aspiration and fall precaution -Seizure precaution -IV fluid

## 2022-04-22 NOTE — Hospital Course (Addendum)
86 year old F with PMH of severe dementia, CKD-3B, DM-2 with neuropathy, HTN, chronic lower back pain, recent hospitalization from 5/23-26 for acute metabolic encephalopathy in the setting of UTI, dehydration and possible anoxic brain injury as noted on MRI, returning from SNF with worsening mental status for about 2 hours prior to presentation.   Last hospitalization, patient's urine culture with Aerococcus and yeast.  She was treated with IV ceftriaxone and discharged on cefadroxil.  Brain MRI raised concern for possible anoxic brain injury for which neurology recommended repeat MRI brain in 1 to 2 weeks.  Patient mumbled  when I asked her name.  She was able to squeeze my fingers but not able to do much.  Does not appear to be in distress. I called Sherwyn who was listed as a daughter for additional information but she did not answer.  I then called patient's son, who told me that Shelbie Proctor is a caregiver not a daughter.  Patient's son stated that patient was able to talk some and feed herself when he saw her last week.   Per EMS report to ED PA, concern about UTI with mild temp to 99 degrees.  In ED, febrile to 102.2.  Other vitals stable.  Normal saturation on RA.  WBC 9.9 with left shift.  Hgb 11.7 (> slightly higher than baseline). Cr 1.38 (1.31 about 2 weeks ago).  Lactic acid 1.2.  COVID-19 and influenza PCR nonreactive.  UA with large LE, few bacteria, > WBC and negative nitrite.  CT head without acute finding.  CXR with cardiomegaly and slight left costophrenic blunting.  MRSA PCR screen negative.  Blood and urine cultures ordered.  Patient was started on IVF, vancomycin and cefepime.  Hospitalist service called for admission due to encephalopathy in the setting of possible UTI.  Discussed about possibility of meningitis with EDP, who ordered fluoroscopy guided lumbar puncture.

## 2022-04-22 NOTE — Assessment & Plan Note (Signed)
Recent Labs    04/05/22 0048 04/05/22 0056 04/06/22 0254 04/07/22 0508 04/22/22 1515  BUN 31* 30* 21 12 16   CREATININE 2.09* 2.10* 1.50* 1.31* 1.38*  Creatinine seems to be at baseline. -Continue IV fluid and monitor

## 2022-04-22 NOTE — Assessment & Plan Note (Signed)
Has fever, mild tachycardia and mild tachypnea.  Unclear source of infection.  See encephalopathy

## 2022-04-22 NOTE — Sepsis Progress Note (Signed)
eLink is following this Code Sepsis. °

## 2022-04-22 NOTE — Assessment & Plan Note (Signed)
Recent Labs    04/05/22 0048 04/05/22 0056 04/05/22 0057 04/06/22 0254 04/07/22 0508 04/22/22 1515  HGB 9.7* 10.2* 10.2* 9.6* 11.0* 11.7*  Hgb seems to be higher than baseline.  Likely hemoconcentration. -IV fluid -Continue monitoring

## 2022-04-22 NOTE — Assessment & Plan Note (Signed)
MRI last hospitalization raise concern for anoxic brain injury. -Repeat MRI as previously recommended

## 2022-04-22 NOTE — ED Notes (Signed)
Patient transported to CT 

## 2022-04-22 NOTE — Progress Notes (Signed)
Assessed with [place] for PIV start. 2 IV RNs assessed. No suitable veins found for PIVs and midline. Patient is code sepsis, not a candidate for PICC. RN made aware. MD aware.

## 2022-04-22 NOTE — ED Notes (Signed)
Holly Valdez (Son) of [place] called asking to be called when there is info to be given. His number is [phone]

## 2022-04-22 NOTE — Progress Notes (Addendum)
PHARMACY ANTIBIOTIC CONSULT NOTE   Holly Valdez a 86 y.o. female admitted on 04/22/2022 with worsened altered mental status and nvbl on arrival (AMS at baseline with dementia). Recent adission in may for acute encephalopathy 2/2 UTI and also found to have hypoxic brain injury of unclear etiology. CXR with no signs of pulmonary edema or focal pulmonary consolidation. Pharmacy has been consulted for Vancomycin/cefepime dosing.  04/22/2022: Scr 1.38 (BL unk), WBC 9.9 Vital Signs: Tm 102.2, HR WNL, BP WNL  Plan: START Cefepime 2g IV Q24H GIVE Vancomycin 1,750 mg IV x1 (Wt used: 75.8 kg)  THEN Vancomycin 1,250 mg IV Q48H (Scr used: 1.38, Vd used: 0.72, eAUC: 464) Monitor renal function, clinical status, de-escalation, C/S, levels as indicated   ADDENDUM: Now concern for possible meningitis.   - Cefepime>>Ceftriaxone 2g IV Q12H per MD  - Continue Vancomycin 1,250 mg IV Q48H (Scr used: 1.38, Vd used: 0.72, eAUC: 464) - START acyclovir 605 mg IV Q24H (10 mg/kg AdjBW)  - Continue LR mIVF while on acyclovir  - Monitor renal function, clinical status, de-escalation, C/S, levels as indicated  - F/U lumbar puncture, GOC, assess PCN allergy for potential to start ampicillin (age >67 yo)    Allergies:  Allergies  Allergen Reactions   Penicillins Other (See Comments)    Unknown reaction - Tolerated keflex and ceftriaxone while hospitalized 09/2020    Filed Weights   04/22/22 1322  Weight: 75.8 kg (167 lb 1.7 oz)       Latest Ref Rng & Units 04/07/2022    5:08 AM 04/06/2022    2:54 AM 04/05/2022   12:57 AM  CBC  WBC 4.0 - 10.5 K/uL 7.0  8.8    Hemoglobin 12.0 - 15.0 g/dL 11.0  9.6  10.2   Hematocrit 36.0 - 46.0 % 34.5  29.5  30.0   Platelets 150 - 400 K/uL 239  247      Antimicrobials this admission: Vancomycin 04/22/2022>>  Cefepime 04/22/2022 x1  Acyclovir 6/9>> Ceftriaxone 6/9>>   Microbiology results: 04/22/2022 Bcx: sent 04/22/2022 Ucx: sent  04/22/2022 MRSA PCR: Not detected  5/23  Ucx: 80,000 colonies yeast, >100,000 colonies Aerococcus spp.   Thank you for allowing pharmacy to be a part of this patient's care.  Adria Dill, PharmD PGY-1 Acute Care Resident  04/22/2022 1:39 PM

## 2022-04-22 NOTE — Progress Notes (Signed)
STAT lumbar puncture -pt unavailable for EEG. Will attempt EEG as schedule permits.

## 2022-04-22 NOTE — Progress Notes (Signed)
Patient is down in MRI right now.

## 2022-04-22 NOTE — ED Provider Notes (Signed)
[facility] EMERGENCY DEPARTMENT Provider Note   CSN: [id number] Arrival date & time: 04/22/22  1251     History  No chief complaint on file.   Holly Valdez is a 86 y.o. female.  Level 5 caveat for dementia and altered mental status.  Patient brought in by EMS with decreased mentation for the past several hours.  She has dementia at baseline with an unclear baseline.  EMS reports she is less responsive than usual with concern for UTI.  Temperature was 99 degrees. Patient nonverbal on arrival not able to give any history. She was recently hospitalized in May for an acute encephalopathy attributed to UTI and was also found to have a hypoxic brain injury of unclear etiology.  The history is provided by the patient and the EMS personnel. The history is limited by the condition of the patient.       Home Medications Prior to Admission medications   Medication Sig Start Date End Date Taking? Authorizing Provider  atorvastatin (LIPITOR) 40 MG tablet Take 40 mg by mouth every evening. 07/28/16   [provider]  calcium carbonate (OSCAL) 1500 (600 Ca) MG TABS tablet Take 1,500 mg by mouth 2 (two) times daily.    [provider]  Cholecalciferol 50 MCG (2000 UT) CAPS Take 1 capsule by mouth daily.    [provider]  diclofenac Sodium (VOLTAREN) 1 % GEL Apply 4 g topically 4 (four) times daily. Patient not taking: Reported on 04/05/2022 03/10/21   Deno Etienne, DO  Docusate Sodium (DSS) 100 MG CAPS Take 100 mg by mouth daily as needed (constipation). 06/03/20   [provider]  DULoxetine HCl 30 MG CSDR Take 30 mg by mouth daily. 09/15/20   [provider]  gabapentin (NEURONTIN) 300 MG capsule Take 300 mg by mouth 3 (three) times daily.    [provider]  hydrALAZINE (APRESOLINE) 25 MG tablet Take 25 mg by mouth 3 (three) times daily. 01/10/19   [provider]  insulin aspart (NOVOLOG) 100 UNIT/ML injection  Inject into the skin See admin instructions. Sliding scale  70-150 0 units  151-200 2 units  201-250 4 units  251-300 6 units  301-400 8 units    [provider]  insulin glargine (LANTUS) 100 UNIT/ML Solostar Pen Inject 10 Units into the skin daily. Can increase to home dose slowly if sugar start to go up. 04/08/22 05/08/22  Antonieta Pert, MD  linagliptin (TRADJENTA) 5 MG TABS tablet Take 5 mg by mouth daily.    [provider]  losartan (COZAAR) 100 MG tablet Take 100 mg by mouth daily. 02/13/18   [provider]  melatonin 3 MG TABS tablet Take 3 mg by mouth at bedtime.    [provider]  Nutritional Supplements (GLUCERNA 1.2 CAL PO) Take 1 tablet by mouth in the morning, at noon, in the evening, and at bedtime.    [provider]  thiamine 100 MG tablet Take 1 tablet (100 mg total) by mouth daily. 04/09/22 05/09/22  Antonieta Pert, MD      Allergies    Penicillins    Review of Systems   Review of Systems  Unable to perform ROS: Dementia    Physical Exam Updated Vital Signs BP (!) 149/92   Pulse 97   Temp (!) 102.2 F (39 C) (Rectal)   Resp (!) 21   Ht 5\' 2"  (1.575 m)   Wt 75.8 kg   SpO2 98%  BMI 30.56 kg/m  Physical Exam Vitals and nursing note reviewed.  Constitutional:      General: She is not in acute distress.    Appearance: She is well-developed. She is ill-appearing.     Comments: Somnolent, arouses to voice, doesn't speak  HENT:     Head: Normocephalic and atraumatic.     Mouth/Throat:     Pharynx: No oropharyngeal exudate.  Eyes:     Conjunctiva/sclera: Conjunctivae normal.     Pupils: Pupils are equal, round, and reactive to light.  Neck:     Comments: No meningismus. Cardiovascular:     Rate and Rhythm: Normal rate and regular rhythm.     Heart sounds: Normal heart sounds. No murmur heard. Pulmonary:     Effort: Pulmonary effort is normal. No respiratory distress.     Breath sounds: Normal breath sounds.  Chest:      Chest wall: No tenderness.  Abdominal:     Palpations: Abdomen is soft.     Tenderness: There is no abdominal tenderness. There is no guarding or rebound.  Musculoskeletal:        General: No tenderness. Normal range of motion.     Cervical back: Normal range of motion and neck supple.  Skin:    General: Skin is warm.  Neurological:     Motor: No abnormal muscle tone.     Comments: Nonverbal, does not speak, follows some commands but not consistently.  Moves all extremities  Psychiatric:        Behavior: Behavior normal.     ED Results / Procedures / Treatments   Labs (all labs ordered are listed, but only abnormal results are displayed) Labs Reviewed  CBC WITH DIFFERENTIAL/PLATELET - Abnormal; Notable for the following components:      Result Value   Hemoglobin 11.7 (*)    Neutro Abs 8.3 (*)    All other components within normal limits  COMPREHENSIVE METABOLIC PANEL - Abnormal; Notable for the following components:   Glucose, Bld 275 (*)    Creatinine, Ser 1.38 (*)    Albumin 3.4 (*)    GFR, Estimated 38 (*)    All other components within normal limits  URINALYSIS, ROUTINE W REFLEX MICROSCOPIC - Abnormal; Notable for the following components:   APPearance CLOUDY (*)    Glucose, UA >=500 (*)    Protein, ur 100 (*)    Leukocytes,Ua LARGE (*)    WBC, UA >50 (*)    Bacteria, UA FEW (*)    Non Squamous Epithelial 0-5 (*)    All other components within normal limits  GLUCOSE, CAPILLARY - Abnormal; Notable for the following components:   Glucose-Capillary 239 (*)    All other components within normal limits  CBG MONITORING, ED - Abnormal; Notable for the following components:   Glucose-Capillary 223 (*)    All other components within normal limits  RESP PANEL BY RT-PCR (FLU A&B, COVID) ARPGX2  MRSA NEXT GEN BY PCR, NASAL  URINE CULTURE  CULTURE, BLOOD (ROUTINE X 2)  CULTURE, BLOOD (ROUTINE X 2)  LACTIC ACID, PLASMA  AMMONIA  LIPASE, BLOOD  PROTIME-INR  APTT   LACTIC ACID, PLASMA  VITAMIN B12  COMPREHENSIVE METABOLIC PANEL  CBC    EKG None  Radiology CT Head Wo Contrast  Result Date: 04/22/2022 CLINICAL DATA:  Delirium. EXAM: CT HEAD WITHOUT CONTRAST TECHNIQUE: Contiguous axial images were obtained from the base of the skull through the vertex without intravenous contrast. RADIATION DOSE REDUCTION: This exam was performed  according to the departmental dose-optimization program which includes automated exposure control, adjustment of the mA and/or kV according to patient size and/or use of iterative reconstruction technique. COMPARISON:  MRI brain and CT head dated Apr 05, 2022. FINDINGS: Brain: No evidence of acute infarction, hemorrhage, hydrocephalus, extra-axial collection or mass lesion/mass effect. Stable atrophy and severe chronic microvascular ischemic changes. Vascular: Calcified atherosclerosis at the skull base. No hyperdense vessel. Skull: Normal. Negative for fracture or focal lesion. Sinuses/Orbits: No acute finding. Other: None. IMPRESSION: 1. No acute intracranial abnormality. 2. Stable atrophy and severe chronic microvascular ischemic changes. Electronically Signed   By: Titus Dubin M.D.   On: 04/22/2022 16:11   DG Chest Portable 1 View  Result Date: 04/22/2022 CLINICAL DATA:  Altered mental status EXAM: PORTABLE CHEST 1 VIEW COMPARISON:  04/05/2022 FINDINGS: Transverse diameter of heart is increased. There are no signs of alveolar pulmonary edema. There is blunting of left lateral CP angle. Radiolucency overlying the cardiac shadow most likely suggests fixed hiatal hernia. IMPRESSION: Cardiomegaly. There are no signs of pulmonary edema or focal pulmonary consolidation. Blunting of left lateral CP angle may be due to pleural thickening or minimal effusion. Possible fixed hiatal hernia. Electronically Signed   By: Elmer Picker M.D.   On: 04/22/2022 13:37    Procedures Procedures    Medications Ordered in ED Medications - No  data to display  ED Course/ Medical Decision Making/ A&P                           Medical Decision Making Amount and/or Complexity of Data Reviewed Independent Historian: EMS Labs: ordered. Decision-making details documented in ED Course. Radiology: ordered and independent interpretation performed. Decision-making details documented in ED Course. ECG/medicine tests: ordered and independent interpretation performed. Decision-making details documented in ED Course.  Risk Prescription drug management. Decision regarding hospitalization.  Mental status change for the past several hours.  No focal neurological deficits, unclear baseline, patient unable to give any history.  Feels warm on arrival.  Rectal temp is 102.2.  Septic work-up was pursued with IV fluids, IV antibiotics after cultures were obtained.  Difficult IV access.  18-gauge peripheral IV placed in the IJ by myself.  Discussed with patient's son Raquel Sarna who lives in [place].  He states patient does have dementia and does not speak much at baseline.  She had a UTI several weeks ago and finished antibiotics.  He states she normally does not answer many questions.  Patient appears to be in no distress.  She moves extremities spontaneously but does not follow commands.  Work-up shows likely UTI.  Lactate is normal.  White blood cell count is normal CT head is stable. CXR without pneumonia. Results reviewed and interpreted by me.  Admission discussed with Dr. Cyndia Skeeters. He asks lumbar puncture to be considered given her fever and altered mental status. She does have an apparent UTI and explanation for her fever which seems more likely than meningitis. She is receiving appropriate treatment with antibiotics for both UTI and possibility of meningitis. Dr. Cyndia Skeeters agrees LP to be considered in AM if no improvement in mental status. Patient unable to consent to procedure. D/w son Raquel Sarna over phone who grants permission for any procedures deemed  necessary.       Final Clinical Impression(s) / ED Diagnoses Final diagnoses:  None    Rx / DC Orders ED Discharge Orders     None  Ezequiel Essex, MD 04/22/22 2051

## 2022-04-22 NOTE — H&P (Signed)
History and Physical    Patient: Holly Valdez:[id number] DOB: 05/14/1936 DOA: 04/22/2022 DOS: the patient was seen and examined on 04/22/2022 PCP: Lucianne Lei, MD  Patient coming [facility]: [facility]  Chief Complaint:  Chief Complaint  Patient presents with   Altered Mental Status   HPI: Holly Valdez 86 year old F with PMH of severe dementia, CKD-3B, DM-2 with neuropathy, HTN, chronic lower back pain, recent hospitalization from 5/23-26 for acute metabolic encephalopathy in the setting of UTI, dehydration and possible anoxic brain injury as noted on MRI, returning from SNF with worsening mental status for about 2 hours prior to presentation.   Last hospitalization, patient's urine culture with Aerococcus and yeast.  She was treated with IV ceftriaxone and discharged on cefadroxil.  Brain MRI raised concern for possible anoxic brain injury for which neurology recommended repeat MRI brain in 1 to 2 weeks.  Patient mumbled  when I asked her name.  She was able to squeeze my fingers but not able to do much.  Does not appear to be in distress. I called Sherwyn who was listed as a daughter for additional information but she did not answer.  I then called patient's son, who told me that Shelbie Proctor is a caregiver not a daughter.  Patient's son stated that patient was able to talk some and feed herself when he saw her last week.   Per EMS report to ED PA, concern about UTI with mild temp to 99 degrees.  In ED, febrile to 102.2.  Other vitals stable.  Normal saturation on RA.  WBC 9.9 with left shift.  Hgb 11.7 (> slightly higher than baseline). Cr 1.38 (1.31 about 2 weeks ago).  Lactic acid 1.2.  COVID-19 and influenza PCR nonreactive.  UA with large LE, few bacteria, > WBC and negative nitrite.  CT head without acute finding.  CXR with cardiomegaly and slight left costophrenic blunting.  MRSA PCR screen negative.  Blood and urine cultures ordered.  Patient was started on IVF, vancomycin and  cefepime.  Hospitalist service called for admission due to encephalopathy in the setting of possible UTI.  Discussed about possibility of meningitis with EDP, who ordered fluoroscopy guided lumbar puncture.     Review of Systems: Unable to review all systems due to lack of cooperation from patient. Past Medical History:  Diagnosis Date   Dementia ([place])    Diabetes mellitus without complication ([place])    Hypertension    History reviewed. No pertinent surgical history. Social History:  reports that she has quit smoking. She has never used smokeless tobacco. She reports that she does not drink alcohol and does not use drugs.  Allergies  Allergen Reactions   Penicillins Other (See Comments)    Unknown reaction - Tolerated keflex and ceftriaxone while hospitalized 09/2020    Family History  Family history unknown: Yes    Prior to Admission medications   Medication Sig Start Date End Date Taking? Authorizing Provider  atorvastatin (LIPITOR) 40 MG tablet Take 40 mg by mouth every evening. 07/28/16   [provider]  calcium carbonate (OSCAL) 1500 (600 Ca) MG TABS tablet Take 1,500 mg by mouth 2 (two) times daily.    [provider]  Cholecalciferol 50 MCG (2000 UT) CAPS Take 1 capsule by mouth daily.    [provider]  diclofenac Sodium (VOLTAREN) 1 % GEL Apply 4 g topically 4 (four) times daily. Patient not taking: Reported on 04/05/2022 03/10/21   Deno Etienne, DO  Docusate  Sodium (DSS) 100 MG CAPS Take 100 mg by mouth daily as needed (constipation). 06/03/20   [provider]  DULoxetine HCl 30 MG CSDR Take 30 mg by mouth daily. 09/15/20   [provider]  gabapentin (NEURONTIN) 300 MG capsule Take 300 mg by mouth 3 (three) times daily.    [provider]  hydrALAZINE (APRESOLINE) 25 MG tablet Take 25 mg by mouth 3 (three) times daily. 01/10/19   [provider]  insulin aspart (NOVOLOG) 100 UNIT/ML injection Inject into the skin  See admin instructions. Sliding scale  70-150 0 units  151-200 2 units  201-250 4 units  251-300 6 units  301-400 8 units    [provider]  insulin glargine (LANTUS) 100 UNIT/ML Solostar Pen Inject 10 Units into the skin daily. Can increase to home dose slowly if sugar start to go up. 04/08/22 05/08/22  Antonieta Pert, MD  linagliptin (TRADJENTA) 5 MG TABS tablet Take 5 mg by mouth daily.    [provider]  losartan (COZAAR) 100 MG tablet Take 100 mg by mouth daily. 02/13/18   [provider]  melatonin 3 MG TABS tablet Take 3 mg by mouth at bedtime.    [provider]  Nutritional Supplements (GLUCERNA 1.2 CAL PO) Take 1 tablet by mouth in the morning, at noon, in the evening, and at bedtime.    [provider]  thiamine 100 MG tablet Take 1 tablet (100 mg total) by mouth daily. 04/09/22 05/09/22  Antonieta Pert, MD    Physical Exam: Vitals:   04/22/22 1445 04/22/22 1530 04/22/22 1545 04/22/22 1615  BP: (!) 147/132 (!) 144/114 (!) 160/134 (!) 159/82  Pulse: 73 92 (!) 103 97  Resp: (!) 23 19 17 19   Temp:      TempSrc:      SpO2: 96% 96% 98% 96%  Weight:      Height:       GENERAL: No apparent distress.  Altered. HEENT: Slightly dry mucous membrane.  Vision and hearing grossly intact.  NECK: Somewhat rigid.  Kernig's and Brudzinski's signs negative. RESP: 96% on RA.  No IWOB.  Fair aeration bilaterally. CVS:  RRR. Heart sounds normal.  ABD/GI/GU: BS+. Abd soft, NTND.  MSK/EXT:  No apparent deformity. No edema.  Seems to be clenching her right fist. SKIN: no apparent skin lesion or wound NEURO: Awake but not alert.  Barely follows commands.  Resisting eye exam bilaterally.  No apparent focal neuro deficit but limited exam due to mental status. PSYCH: Altered.  No distress or agitation.  Data Reviewed: See HPI  Assessment and Plan: * Acute metabolic encephalopathy Recently hospitalized for the same when she was treated for UTI and dehydration  with improvement in her mental status and discharged to SNF.  At that time, she was back to "baseline". Per patient's son, she was able to talk few and few days self when he saw her about a week ago.  She has significant fever.  She has pyuria with few bacteria on UA but no suprapubic tenderness or CVA tenderness.  Ammonia within normal.  She seems to be on gabapentin but not significantly high dose.  Head neck looks stiff but not sure if this is new.  Does not appear to have focal neurodeficit.  Difficult to exclude seizure activity.  His CT head did not show any acute finding.  MRSA PCR screen negative.  Cultures drawn in ED.  Started on vancomycin and cefepime. -Fluoroscopy guided LP requested -  Continue broad-spectrum antibiotics with vancomycin and ceftriaxone -Cannot use ampicillin due to severe penicillin allergy. -We will add IV acyclovir -Follow cultures -Check EEG -MRI brain for follow-up on possible ABI which was recommended previous hospitalization -Palliative medicine consulted for further goals of care discussion given recurrent hospitalization -N.p.o. pending SLP eval -Aspiration and fall precaution -Seizure precaution -IV fluid  Goals of care, counseling/discussion Patient is DNR/DNI.  She has MOST form at bedside.  Discussed about inevitable recurrent hospitalization given severe dementia and other comorbidities with patient's son over the phone.  I broached about hospice if she does not improve over the next 24 hours.  I also offered palliative care consult.  He appreciated the call and information. -Palliative medicine consulted.  CKD-3B Recent Labs    04/05/22 0048 04/05/22 0056 04/06/22 0254 04/07/22 0508 04/22/22 1515  BUN 31* 30* 21 12 16   CREATININE 2.09* 2.10* 1.50* 1.31* 1.38*  Creatinine seems to be at baseline. -Continue IV fluid and monitor   Anoxic brain injury [facility]) MRI last hospitalization raise concern for anoxic brain injury. -Repeat MRI as  previously recommended  SIRS (systemic inflammatory response syndrome) (HCC) Has fever, mild tachycardia and mild tachypnea.  Unclear source of infection.  See encephalopathy  Anemia in chronic kidney disease Recent Labs    04/05/22 0048 04/05/22 0056 04/05/22 0057 04/06/22 0254 04/07/22 0508 04/22/22 1515  HGB 9.7* 10.2* 10.2* 9.6* 11.0* 11.7*  Hgb seems to be higher than baseline.  Likely hemoconcentration. -IV fluid -Continue monitoring   Dementia with behavioral disturbance (HCC) Seems to be very advanced.  See encephalopathy  Uncontrolled type 2 diabetes mellitus with hyperglycemia (HCC) A1c 9.9% 2 weeks ago.  She has hyperglycemia but not in hyperglycemic crisis. -CBG monitoring and SSI-sensitive while n.p.o.       Advance Care Planning:   Code Status: DNR   Consults: Palliative medicine  Family Communication: Updated patient's son over the phone.  Severity of Illness: The appropriate patient status for this patient is OBSERVATION. Observation status is judged to be reasonable and necessary in order to provide the required intensity of service to ensure the patient's safety. The patient's presenting symptoms, physical exam findings, and initial radiographic and laboratory data in the context of their medical condition is felt to place them at decreased risk for further clinical deterioration. Furthermore, it is anticipated that the patient will be medically stable for discharge from the hospital within 2 midnights of admission.   Author: Mercy Riding, MD 04/22/2022 5:42 PM  For on call review [email or web address].

## 2022-04-22 NOTE — ED Notes (Signed)
This RN attempted many times for PIV access on this pt with no luck. IV team is at bedside.

## 2022-04-22 NOTE — ED Triage Notes (Signed)
Patient presents to ed vial [facility] from [facility], per ems staff states patient had increased AMS approx. 1 hours ago, states patient always has AMS worse today. Currently non-verbal. Warm to touch

## 2022-04-22 NOTE — Progress Notes (Signed)
Patient is on radar. STAT order in ED came in. Will place EEG later on tonight.

## 2022-04-23 ENCOUNTER — Observation Stay (HOSPITAL_COMMUNITY): Payer: Medicare Other

## 2022-04-23 DIAGNOSIS — G9340 Encephalopathy, unspecified: Secondary | ICD-10-CM | POA: Diagnosis present

## 2022-04-23 DIAGNOSIS — R4182 Altered mental status, unspecified: Secondary | ICD-10-CM | POA: Diagnosis not present

## 2022-04-23 DIAGNOSIS — E1165 Type 2 diabetes mellitus with hyperglycemia: Secondary | ICD-10-CM | POA: Diagnosis present

## 2022-04-23 DIAGNOSIS — E1122 Type 2 diabetes mellitus with diabetic chronic kidney disease: Secondary | ICD-10-CM | POA: Diagnosis present

## 2022-04-23 DIAGNOSIS — Z66 Do not resuscitate: Secondary | ICD-10-CM | POA: Diagnosis present

## 2022-04-23 DIAGNOSIS — E1140 Type 2 diabetes mellitus with diabetic neuropathy, unspecified: Secondary | ICD-10-CM | POA: Diagnosis present

## 2022-04-23 DIAGNOSIS — F03C18 Unspecified dementia, severe, with other behavioral disturbance: Secondary | ICD-10-CM | POA: Diagnosis present

## 2022-04-23 DIAGNOSIS — Z87891 Personal history of nicotine dependence: Secondary | ICD-10-CM | POA: Diagnosis not present

## 2022-04-23 DIAGNOSIS — Z880 Allergy status to penicillin: Secondary | ICD-10-CM | POA: Diagnosis not present

## 2022-04-23 DIAGNOSIS — F03918 Unspecified dementia, unspecified severity, with other behavioral disturbance: Secondary | ICD-10-CM | POA: Diagnosis not present

## 2022-04-23 DIAGNOSIS — E669 Obesity, unspecified: Secondary | ICD-10-CM | POA: Diagnosis present

## 2022-04-23 DIAGNOSIS — R296 Repeated falls: Secondary | ICD-10-CM | POA: Diagnosis present

## 2022-04-23 DIAGNOSIS — Z6832 Body mass index (BMI) 32.0-32.9, adult: Secondary | ICD-10-CM | POA: Diagnosis not present

## 2022-04-23 DIAGNOSIS — R6510 Systemic inflammatory response syndrome (SIRS) of non-infectious origin without acute organ dysfunction: Secondary | ICD-10-CM | POA: Diagnosis present

## 2022-04-23 DIAGNOSIS — Z20822 Contact with and (suspected) exposure to covid-19: Secondary | ICD-10-CM | POA: Diagnosis present

## 2022-04-23 DIAGNOSIS — B3749 Other urogenital candidiasis: Secondary | ICD-10-CM | POA: Diagnosis present

## 2022-04-23 DIAGNOSIS — G931 Anoxic brain damage, not elsewhere classified: Secondary | ICD-10-CM | POA: Diagnosis not present

## 2022-04-23 DIAGNOSIS — Z7189 Other specified counseling: Secondary | ICD-10-CM | POA: Diagnosis not present

## 2022-04-23 DIAGNOSIS — E871 Hypo-osmolality and hyponatremia: Secondary | ICD-10-CM | POA: Diagnosis not present

## 2022-04-23 DIAGNOSIS — N1832 Chronic kidney disease, stage 3b: Secondary | ICD-10-CM | POA: Diagnosis present

## 2022-04-23 DIAGNOSIS — G9341 Metabolic encephalopathy: Secondary | ICD-10-CM | POA: Diagnosis present

## 2022-04-23 DIAGNOSIS — Z794 Long term (current) use of insulin: Secondary | ICD-10-CM | POA: Diagnosis not present

## 2022-04-23 DIAGNOSIS — D631 Anemia in chronic kidney disease: Secondary | ICD-10-CM | POA: Diagnosis present

## 2022-04-23 DIAGNOSIS — I1310 Hypertensive heart and chronic kidney disease without heart failure, with stage 1 through stage 4 chronic kidney disease, or unspecified chronic kidney disease: Secondary | ICD-10-CM | POA: Diagnosis present

## 2022-04-23 DIAGNOSIS — Z79899 Other long term (current) drug therapy: Secondary | ICD-10-CM | POA: Diagnosis not present

## 2022-04-23 DIAGNOSIS — Z8616 Personal history of COVID-19: Secondary | ICD-10-CM | POA: Diagnosis not present

## 2022-04-23 DIAGNOSIS — Z515 Encounter for palliative care: Secondary | ICD-10-CM | POA: Diagnosis not present

## 2022-04-23 LAB — COMPREHENSIVE METABOLIC PANEL
ALT: 14 U/L (ref 0–44)
AST: 25 U/L (ref 15–41)
Albumin: 3 g/dL — ABNORMAL LOW (ref 3.5–5.0)
Alkaline Phosphatase: 50 U/L (ref 38–126)
Anion gap: 10 (ref 5–15)
BUN: 16 mg/dL (ref 8–23)
CO2: 20 mmol/L — ABNORMAL LOW (ref 22–32)
Calcium: 8.7 mg/dL — ABNORMAL LOW (ref 8.9–10.3)
Chloride: 103 mmol/L (ref 98–111)
Creatinine, Ser: 1.31 mg/dL — ABNORMAL HIGH (ref 0.44–1.00)
GFR, Estimated: 40 mL/min — ABNORMAL LOW (ref >60)
Glucose, Bld: 239 mg/dL — ABNORMAL HIGH (ref 70–99)
Potassium: 4.3 mmol/L (ref 3.5–5.1)
Sodium: 133 mmol/L — ABNORMAL LOW (ref 135–145)
Total Bilirubin: 0.7 mg/dL (ref 0.3–1.2)
Total Protein: 6.3 g/dL — ABNORMAL LOW (ref 6.5–8.1)

## 2022-04-23 LAB — CSF CELL COUNT WITH DIFFERENTIAL
RBC Count, CSF: 48 /mm3 — ABNORMAL HIGH
Tube #: 1
WBC, CSF: 3 /mm3 (ref 0–5)

## 2022-04-23 LAB — PROTEIN, CSF: Total  Protein, CSF: 83 mg/dL — ABNORMAL HIGH (ref 15–45)

## 2022-04-23 LAB — GLUCOSE, CAPILLARY
Glucose-Capillary: 196 mg/dL — ABNORMAL HIGH (ref 70–99)
Glucose-Capillary: 171 mg/dL — ABNORMAL HIGH (ref 70–99)
Glucose-Capillary: 170 mg/dL — ABNORMAL HIGH (ref 70–99)
Glucose-Capillary: 122 mg/dL — ABNORMAL HIGH (ref 70–99)

## 2022-04-23 LAB — CBC
HCT: 33.3 % — ABNORMAL LOW (ref 36.0–46.0)
Hemoglobin: 10.9 g/dL — ABNORMAL LOW (ref 12.0–15.0)
MCH: 27.9 pg (ref 26.0–34.0)
MCHC: 32.7 g/dL (ref 30.0–36.0)
MCV: 85.2 fL (ref 80.0–100.0)
Platelets: 258 10*3/uL (ref 150–400)
RBC: 3.91 MIL/uL (ref 3.87–5.11)
RDW: 14.3 % (ref 11.5–15.5)
WBC: 8.2 10*3/uL (ref 4.0–10.5)
nRBC: 0 % (ref 0.0–0.2)

## 2022-04-23 LAB — URINE CULTURE: Culture: 60000 — AB

## 2022-04-23 LAB — GLUCOSE, CSF: Glucose, CSF: 110 mg/dL — ABNORMAL HIGH (ref 40–70)

## 2022-04-23 IMAGING — RF DG FLUORO GUIDE SPINAL/SI JT INJ*L*
1 series · 1 of 1 positions shown · non-contrast
Comparison: none

CLINICAL DATA: Encephalopathy. Request for image guided diagnostic
lumbar puncture.

[Series 1: cp_standard · 0.17mm/px · 1 of 1 slices shown]
[im 1/1]
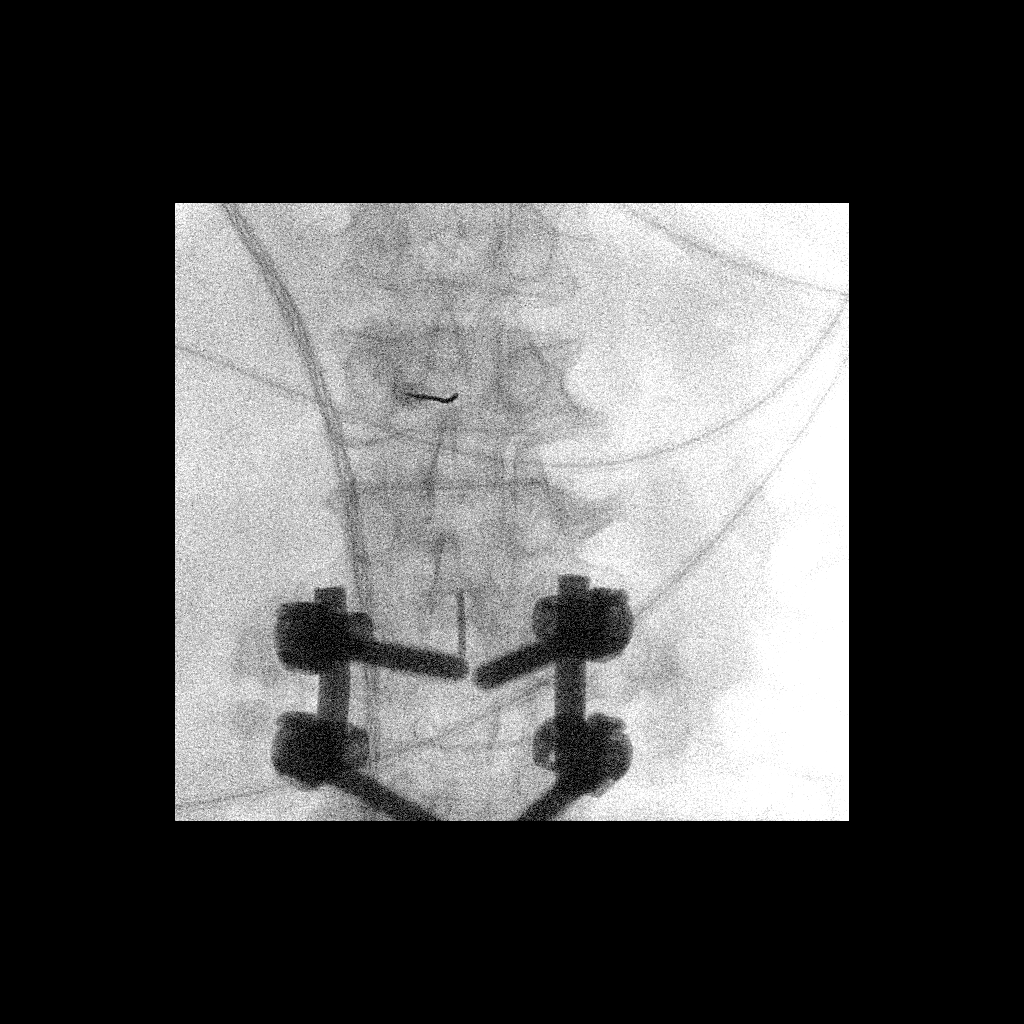

[1 of 1 positions shown; findings below may reference images not displayed]

EXAM:
DIAGNOSTIC LUMBAR PUNCTURE UNDER FLUOROSCOPIC GUIDANCE

FLUOROSCOPY TIME:  Radiation Exposure Index (as provided by the
fluoroscopic device): 9.3 mGy

If the device does not provide the exposure index:

Fluoroscopy Time (in minutes and seconds):  1 minute, 24 seconds

PROCEDURE:
Informed consent was obtained from the patient prior to the
procedure, including potential complications of headache, allergy,
and pain. With the patient prone, the lower back was prepped with
Betadine. 1% Lidocaine was used for local anesthesia. Lumbar
puncture was performed at the L2-L3 level using a 20 gauge needle
with return of clear, colorless CSF with an opening pressure of 20
cm water. Approximally 9 ml of CSF were obtained for laboratory
studies. The patient tolerated the procedure well and there were no
apparent complications.
IMPRESSION: Technically successful image guided lumbar puncture from the L2-L3
level as above.

Procedure performed by: TYLA supervised by Dr. TYLA
TYLA

## 2022-04-23 MED ORDER — ORAL CARE MOUTH RINSE
15.0000 mL | Freq: Two times a day (BID) | OROMUCOSAL | Status: DC
  Administered 2022-04-23 – 2022-04-26 (×6): 15 mL via OROMUCOSAL

## 2022-04-23 MED ORDER — FLUCONAZOLE IN SODIUM CHLORIDE 200-0.9 MG/100ML-% IV SOLN
200.0000 mg | INTRAVENOUS | Status: DC
  Administered 2022-04-23 – 2022-04-24 (×2): 200 mg via INTRAVENOUS
  Filled 2022-04-23 (×2): qty 100

## 2022-04-23 MED ORDER — LIDOCAINE HCL (PF) 1 % IJ SOLN: 6.0000 mL | Freq: Once | INTRAMUSCULAR | Status: DC

## 2022-04-23 NOTE — Procedures (Signed)
Patient Name: [name]  MRN: [id number]  Epilepsy Attending: Lora Havens  Referring Physician/Provider: Mercy Riding, MD  Date: 04/23/2022 Duration: 22.24 mins  Patient history: 86yo F with ams. EEG to evaluate for seizure  Level of alertness: Awake, drowsy  AEDs during EEG study: None  Technical aspects: This EEG study was done with scalp electrodes positioned according to the 10-20 International system of electrode placement. Electrical activity was acquired at a sampling rate of 500Hz  and reviewed with a high frequency filter of 70Hz  and a low frequency filter of 1Hz . EEG data were recorded continuously and digitally stored.   Description: The posterior dominant rhythm consists of 8 Hz activity of moderate voltage (25-35 uV) seen predominantly in posterior head regions, symmetric and reactive to eye opening and eye closing. Drowsiness was characterized by attenuation of the posterior background rhythm. Sleep was characterized by vertex waves, sleep spindles (12 to 14 Hz), maximal frontocentral region.  EEG showed intermittent generalized 3 to 6 Hz theta-delta slowing, at times with triphasic morphology. Hyperventilation and photic stimulation were not performed.     ABNORMALITY - Intermittent slow, generalized  IMPRESSION: This study is suggestive of mild to moderate diffuse encephalopathy, nonspecific etiology but likely related to toxic-metabolic causes. No seizures or definite epileptiform discharges were seen throughout the recording.  Sayge Brienza Barbra Sarks

## 2022-04-23 NOTE — Progress Notes (Signed)
SLP Cancellation Note  Patient Details Name: Holly Valdez MRN: [id number] DOB: Jun 06, 1936   Cancelled treatment:       Reason Eval/Treat Not Completed: Patient at procedure or test/unavailable; will continue efforts when pt returns to the floor  Hayden Rasmussen. MA, CCC-SLP Acute Rehabilitation Services   04/23/2022, 11:43 AM

## 2022-04-23 NOTE — Progress Notes (Signed)
PROGRESS NOTE  Holly Valdez XTK:[id number] DOB: Oct 01, 1936   PCP: Lucianne Lei, MD  Patient is from: SNF  DOA: 04/22/2022 LOS: 0  Chief complaints Chief Complaint  Patient presents with   Altered Mental Status     Brief Narrative / Interim history: 86 year old F with PMH of severe dementia, CKD-3B, DM-2 with neuropathy, HTN, chronic lower back pain, recent hospitalization from 5/23-26 for acute metabolic encephalopathy in the setting of UTI, dehydration and possible anoxic brain injury as noted on MRI, returning from SNF with worsening mental status for about 2 hours prior to presentation.  She was febrile to 102.2 F but no leukocytosis.  UA with pyuria and rare bacteria.  MRI brain without acute finding.  Cultures obtained.  LP ordered.  Started on vancomycin and cefepime, and admitted for acute metabolic encephalopathy.  Subjective: Seen and examined earlier this morning.  No major events overnight of this morning.  She spiked another fever to 101 about 8:30 PM.  She has been afebrile since then.  Hemodynamically stable.  She is a sleepy but wakes to voice and gentle touch.  She is not interacting.  Does not follow command.  Resisting eye exam.   Objective: Vitals:   04/22/22 2032 04/22/22 2322 04/23/22 0400 04/23/22 0730  BP: (!) 148/82 (!) 141/68 (!) 123/57 (!) 156/64  Pulse: 99 70 65 (!) 56  Resp: 18 15 15 19   Temp: (!) 101 F (38.3 C) 99.8 F (37.7 C) 98.7 F (37.1 C) 98.4 F (36.9 C)  TempSrc: Axillary Axillary Axillary Axillary  SpO2: 96% 97% 96%   Weight: 81.7 kg     Height:        Examination:  GENERAL: No apparent distress.  HEENT: MMM.  Vision and hearing grossly intact.  NECK: No apparent JVD.  RESP:  No IWOB.  Fair aeration bilaterally. CVS:  RRR. Heart sounds normal.  ABD/GI/GU: BS+. Abd soft, NTND.  MSK/EXT:  No apparent deformity. No edema.  SKIN: no apparent skin lesion or wound NEURO: Sleepy but wakes to voice.  Not interactive.  Does not follow  command. PSYCH: Calm.  No distress or agitation.  Procedures:  6/10-fluoroscopy guided LP  Microbiology summarized: 6/9-COVID-19 and influenza PCR nonreactive 6/9-blood cultures NGTD 6/9-urine culture with 60 colonies of yeast 6/9-MRSA PCR screen negative. 6/10-CSF culture pending  Assessment and plan: Principal Problem:   Acute metabolic encephalopathy Active Problems:   Goals of care, counseling/discussion   CKD-3B   Anoxic brain injury ([facility])   Uncontrolled type 2 diabetes mellitus with hyperglycemia ([facility])   Dementia with behavioral disturbance (HCC)   Anemia in chronic kidney disease   SIRS (systemic inflammatory response syndrome) (HCC)  Acute metabolic encephalopathy in patient with history of severe dementia and possible ABI: unclear etiology yet.  She has fever but no leukocytosis.  Infectious work-up unrevealing except for some yeast in the urine culture probably from antibiotics for recurrent UTI.  Previous MRI concerning for ABI.  MRI brain this admission without acute finding.  Her recent TSH was normal.  Ammonia and B12 within normal as well.  EEG negative for seizure or epileptiform discharge. -S/p lumbar puncture -Follow CSF studies -Continue broad-spectrum antibiotics and acyclovir pending CSF studies -Neurology consulted -N.p.o. -IV fluid -Aspiration precaution.  Goals of care, counseling/discussion: DNR/DNI.  See H&P for more. -Palliative medicine to see patient today   CKD-3B: Stable. Recent Labs    04/05/22 0048 04/05/22 0056 04/06/22 0254 04/07/22 0508 04/22/22 1515 04/23/22 0148  BUN 31* 30* 21  12 16 16   CREATININE 2.09* 2.10* 1.50* 1.31* 1.38* 1.31*  -Continue IV fluid and monitor   SIRS (systemic inflammatory response syndrome) ([place]): had fever, mild tachycardia and mild tachypnea on presentation. Unclear source of infection.   -See encephalopathy   Anemia in chronic kidney disease: Stable. Recent Labs    04/05/22 0048 04/05/22 0056  04/05/22 0057 04/06/22 0254 04/07/22 0508 04/22/22 1515 04/23/22 0148  HGB 9.7* 10.2* 10.2* 9.6* 11.0* 11.7* 10.9*  -Continue monitoring   Dementia with behavioral disturbance ([place]): seems to be very advanced.   -See encephalopathy   Uncontrolled type 2 diabetes mellitus with hyperglycemia ([facility]): A1c 9.9% 2 weeks ago.  She has hyperglycemia but not in hyperglycemic crisis. Recent Labs  Lab 04/22/22 1834 04/22/22 2029 04/23/22 0732  GLUCAP 223* 239* 196*  -CBG monitoring and SSI-sensitive while n.p.o.  Obesity Body mass index is 32.94 kg/m.         DVT prophylaxis:  Place and maintain sequential compression device Start: 04/23/22 0738 heparin injection 5,000 Units Start: 04/22/22 2200  Code Status: DNR/DNI Family Communication: Updated patient's son over the phone. Level of care: Telemetry Medical Status is: Inpatient The patient will require care spanning > 2 midnights and should be moved to inpatient because: Acute encephalopathy   Final disposition: TBD Consultants:  Neurology Palliative medicine  Sch Meds:  Scheduled Meds:  heparin  5,000 Units Subcutaneous Q8H   insulin aspart  0-5 Units Subcutaneous QHS   insulin aspart  0-9 Units Subcutaneous TID WC   lidocaine (PF)  6 mL Subcutaneous Once   Continuous Infusions:  acyclovir 605 mg (04/23/22 0040)   cefTRIAXone (ROCEPHIN)  IV 2 g (04/23/22 1013)   lactated ringers 150 mL/hr at 04/23/22 1328   [START ON 04/24/2022] vancomycin     PRN Meds:.acetaminophen **OR** acetaminophen, ondansetron **OR** ondansetron (ZOFRAN) IV  Antimicrobials: Anti-infectives (From admission, onward)    Start     Dose/Rate Route Frequency Ordered Stop   04/24/22 1400  vancomycin (VANCOREADY) IVPB 1750 mg/350 mL        1,750 mg 175 mL/hr over 120 Minutes Intravenous Every 48 hours 04/22/22 1638     04/23/22 1525  ceFEPIme (MAXIPIME) 2 g in sodium chloride 0.9 % 100 mL IVPB  Status:  Discontinued        2 g 200 mL/hr over 30  Minutes Intravenous Every 24 hours 04/22/22 1638 04/22/22 1738   04/22/22 2200  cefTRIAXone (ROCEPHIN) 2 g in sodium chloride 0.9 % 100 mL IVPB        2 g 200 mL/hr over 30 Minutes Intravenous Every 12 hours 04/22/22 1738     04/22/22 1800  acyclovir (ZOVIRAX) 605 mg in dextrose 5 % 100 mL IVPB        10 mg/kg  60.4 kg (Adjusted) 112.1 mL/hr over 60 Minutes Intravenous Every 24 hours 04/22/22 1752     04/22/22 1345  vancomycin (VANCOREADY) IVPB 1750 mg/350 mL        1,750 mg 175 mL/hr over 120 Minutes Intravenous  Once 04/22/22 1331 04/22/22 1822   04/22/22 1345  ceFEPIme (MAXIPIME) 2 g in sodium chloride 0.9 % 100 mL IVPB  Status:  Discontinued        2 g 200 mL/hr over 30 Minutes Intravenous Every 12 hours 04/22/22 1331 04/22/22 1638   04/22/22 1330  ceFEPIme (MAXIPIME) 2 g in sodium chloride 0.9 % 100 mL IVPB  Status:  Discontinued        2 g 200 mL/hr  over 30 Minutes Intravenous  Once 04/22/22 1326 04/22/22 1331        I have personally reviewed the following labs and images: CBC: Recent Labs  Lab 04/22/22 1515 04/23/22 0148  WBC 9.9 8.2  NEUTROABS 8.3*  --   HGB 11.7* 10.9*  HCT 36.1 33.3*  MCV 86.8 85.2  PLT 298 258   BMP &GFR Recent Labs  Lab 04/22/22 1515 04/23/22 0148  NA 135 133*  K 4.6 4.3  CL 101 103  CO2 25 20*  GLUCOSE 275* 239*  BUN 16 16  CREATININE 1.38* 1.31*  CALCIUM 9.3 8.7*   Estimated Creatinine Clearance: 32.2 mL/min (A) (by C-G formula based on SCr of 1.31 mg/dL (H)). Liver & Pancreas: Recent Labs  Lab 04/22/22 1515 04/23/22 0148  AST 19 25  ALT 17 14  ALKPHOS 58 50  BILITOT 0.6 0.7  PROT 6.9 6.3*  ALBUMIN 3.4* 3.0*   Recent Labs  Lab 04/22/22 1515  LIPASE 35   Recent Labs  Lab 04/22/22 1515  AMMONIA <10   Diabetic: No results for input(s): "HGBA1C" in the last 72 hours. Recent Labs  Lab 04/22/22 1834 04/22/22 2029 04/23/22 0732  GLUCAP 223* 239* 196*   Cardiac Enzymes: No results for input(s): "CKTOTAL",  "CKMB", "CKMBINDEX", "TROPONINI" in the last 168 hours. No results for input(s): "PROBNP" in the last 8760 hours. Coagulation Profile: Recent Labs  Lab 04/22/22 1515  INR 1.1   Thyroid Function Tests: No results for input(s): "TSH", "T4TOTAL", "FREET4", "T3FREE", "THYROIDAB" in the last 72 hours. Lipid Profile: No results for input(s): "CHOL", "HDL", "LDLCALC", "TRIG", "CHOLHDL", "LDLDIRECT" in the last 72 hours. Anemia Panel: Recent Labs    04/22/22 2112  VITAMINB12 601   Urine analysis:    Component Value Date/Time   COLORURINE YELLOW 04/22/2022 1600   APPEARANCEUR CLOUDY (A) 04/22/2022 1600   LABSPEC 1.020 04/22/2022 1600   PHURINE 6.0 04/22/2022 1600   GLUCOSEU >=500 (A) 04/22/2022 1600   HGBUR NEGATIVE 04/22/2022 1600   BILIRUBINUR NEGATIVE 04/22/2022 1600   KETONESUR NEGATIVE 04/22/2022 1600   PROTEINUR 100 (A) 04/22/2022 1600   NITRITE NEGATIVE 04/22/2022 1600   LEUKOCYTESUR LARGE (A) 04/22/2022 1600   Sepsis Labs: Invalid input(s): "PROCALCITONIN", "LACTICIDVEN"  Microbiology: Recent Results (from the past 240 hour(s))  Urine Culture     Status: Abnormal   Collection Time: 04/22/22  1:12 PM   Specimen: Urine, Clean Catch  Result Value Ref Range Status   Specimen Description URINE, CLEAN CATCH  Final   Special Requests NONE  Final   Culture (A)  Final    60,000 COLONIES/mL YEAST WITHIN MIXED ORGANISMS Performed at [facility], [address] [place], [place] [address]    Report Status 04/23/2022 FINAL  Final  Blood culture (routine x 2)     Status: None (Preliminary result)   Collection Time: 04/22/22  1:12 PM   Specimen: BLOOD  Result Value Ref Range Status   Specimen Description BLOOD BLOOD LEFT ARM  Final   Special Requests   Final    BOTTLES DRAWN AEROBIC AND ANAEROBIC Blood Culture results may not be optimal due to an inadequate volume of blood received in culture bottles   Culture   Final    NO GROWTH < 24 HOURS Performed at [facility], [place] [address] [place], [place] [address]    Report Status PENDING  Incomplete  Resp Panel by RT-PCR (Flu A&B, Covid)     Status: None  Collection Time: 04/22/22  1:26 PM   Specimen: Nasal Swab  Result Value Ref Range Status   SARS Coronavirus 2 by RT PCR NEGATIVE NEGATIVE Final    Comment: (NOTE) SARS-CoV-2 target nucleic acids are NOT DETECTED.  The SARS-CoV-2 RNA is generally detectable in upper respiratory specimens during the acute phase of infection. The lowest concentration of SARS-CoV-2 viral copies this assay can detect is 138 copies/mL. A negative result does not preclude SARS-Cov-2 infection and should not be used as the sole basis for treatment or other patient management decisions. A negative result may occur with  improper specimen collection/handling, submission of specimen other than nasopharyngeal swab, presence of viral mutation(s) within the areas targeted by this assay, and inadequate number of viral copies(<138 copies/mL). A negative result must be combined with clinical observations, patient history, and epidemiological information. The expected result is Negative.  Fact Sheet for Patients:  EntrepreneurPulse.com.au  Fact Sheet for Healthcare Providers:  IncredibleEmployment.be  This test is no t yet approved or cleared by the [place] FDA and  has been authorized for detection and/or diagnosis of SARS-CoV-2 by FDA under an Emergency Use Authorization (EUA). This EUA will remain  in effect (meaning this test can be used) for the duration of the COVID-19 declaration under Section 564(b)(1) of the Act, 21 U.S.C.section 360bbb-3(b)(1), unless the authorization is terminated  or revoked sooner.       Influenza A by PCR NEGATIVE NEGATIVE Final   Influenza B by PCR NEGATIVE NEGATIVE Final    Comment: (NOTE) The Xpert Xpress SARS-CoV-2/FLU/RSV plus assay is intended as an aid in the diagnosis of influenza  from Nasopharyngeal swab specimens and should not be used as a sole basis for treatment. Nasal washings and aspirates are unacceptable for Xpert Xpress SARS-CoV-2/FLU/RSV testing.  Fact Sheet for Patients: EntrepreneurPulse.com.au  Fact Sheet for Healthcare Providers: IncredibleEmployment.be  This test is not yet approved or cleared by the [place] FDA and has been authorized for detection and/or diagnosis of SARS-CoV-2 by FDA under an Emergency Use Authorization (EUA). This EUA will remain in effect (meaning this test can be used) for the duration of the COVID-19 declaration under Section 564(b)(1) of the Act, 21 U.S.C. section 360bbb-3(b)(1), unless the authorization is terminated or revoked.  Performed at [facility], [place] [address] [place], [place] [address]   MRSA Next Gen by PCR, Nasal     Status: None   Collection Time: 04/22/22  1:46 PM   Specimen: Nasal Swab  Result Value Ref Range Status   MRSA by PCR Next Gen NOT DETECTED NOT DETECTED Final    Comment: (NOTE) The GeneXpert MRSA Assay (FDA approved for NASAL specimens only), is one component of a comprehensive MRSA colonization surveillance program. It is not intended to diagnose MRSA infection nor to guide or monitor treatment for MRSA infections. Test performance is not FDA approved in patients less than 46 years old. Performed at [facility], [place] [address] [place], [place] [address]   Blood culture (routine x 2)     Status: None (Preliminary result)   Collection Time: 04/22/22  2:08 PM   Specimen: BLOOD  Result Value Ref Range Status   Specimen Description BLOOD BLOOD RIGHT ARM  Final   Special Requests   Final    BOTTLES DRAWN AEROBIC AND ANAEROBIC Blood Culture results may not be optimal due to an inadequate volume of blood received in culture bottles   Culture   Final    NO  GROWTH < 24 HOURS Performed at [facility], [place] [address],  [place], [place] [address]    Report Status PENDING  Incomplete    Radiology Studies: DG Lumbar Puncture Fluoro Guide  Result Date: 04/23/2022 CLINICAL DATA:  Encephalopathy. Request for image guided diagnostic lumbar puncture. EXAM: DIAGNOSTIC LUMBAR PUNCTURE UNDER FLUOROSCOPIC GUIDANCE FLUOROSCOPY TIME:  Radiation Exposure Index (as provided by the fluoroscopic device): 9.3 mGy If the device does not provide the exposure index: Fluoroscopy Time (in minutes and seconds):  1 minute, 24 seconds PROCEDURE: Informed consent was obtained from the patient prior to the procedure, including potential complications of headache, allergy, and pain. With the patient prone, the lower back was prepped with Betadine. 1% Lidocaine was used for local anesthesia. Lumbar puncture was performed at the L2-L3 level using a 20 gauge needle with return of clear, colorless CSF with an opening pressure of 20 cm water. Approximally 9 ml of CSF were obtained for laboratory studies. The patient tolerated the procedure well and there were no apparent complications. IMPRESSION: Technically successful image guided lumbar puncture from the L2-L3 level as above. Procedure performed by: Ascencion Dike PA-C supervised by Dr. Lajean Manes Electronically Signed   By: Lajean Manes M.D.   On: 04/23/2022 12:48   EEG adult  Result Date: 04/23/2022 Lora Havens, MD     04/23/2022  7:48 AM Patient Name: Holly Valdez MRN: [id number] Epilepsy Attending: Lora Havens Referring Physician/Provider: Mercy Riding, MD Date: 04/23/2022 Duration: 22.24 mins Patient history: 86yo F with ams. EEG to evaluate for seizure Level of alertness: Awake, drowsy AEDs during EEG study: None Technical aspects: This EEG study was done with scalp electrodes positioned according to the 10-20 International system of electrode placement. Electrical activity was acquired at a sampling rate of 500Hz  and reviewed with a high frequency filter of 70Hz  and a low frequency  filter of 1Hz . EEG data were recorded continuously and digitally stored. Description: The posterior dominant rhythm consists of 8 Hz activity of moderate voltage (25-35 uV) seen predominantly in posterior head regions, symmetric and reactive to eye opening and eye closing. Drowsiness was characterized by attenuation of the posterior background rhythm. Sleep was characterized by vertex waves, sleep spindles (12 to 14 Hz), maximal frontocentral region.  EEG showed intermittent generalized 3 to 6 Hz theta-delta slowing, at times with triphasic morphology. Hyperventilation and photic stimulation were not performed.   ABNORMALITY - Intermittent slow, generalized IMPRESSION: This study is suggestive of mild to moderate diffuse encephalopathy, nonspecific etiology but likely related to toxic-metabolic causes. No seizures or definite epileptiform discharges were seen throughout the recording. Lora Havens   MR BRAIN WO CONTRAST  Result Date: 04/23/2022 CLINICAL DATA:  Altered mental status EXAM: MRI HEAD WITHOUT CONTRAST TECHNIQUE: Multiplanar, multiecho pulse sequences of the brain and surrounding structures were obtained without intravenous contrast. COMPARISON:  04/05/2022 FINDINGS: Brain: No acute infarct, mass effect or extra-axial collection. No acute or chronic hemorrhage. There is multifocal hyperintense T2-weighted signal within the white matter. Generalized cerebral volume loss. Old, small right cerebellar infarct. The midline structures are normal. Vascular: Major flow voids are preserved. Skull and upper cervical spine: Normal calvarium and skull base. Visualized upper cervical spine and soft tissues are normal. Sinuses/Orbits:No paranasal sinus fluid levels or advanced mucosal thickening. No mastoid or middle ear effusion. Normal orbits. IMPRESSION: 1. No acute intracranial abnormality. 2. Generalized cerebral volume loss and findings of chronic ischemic microangiopathy. Electronically Signed   By:  Lennette Bihari  [place] M.D.   On: 04/23/2022 00:07   CT Head Wo Contrast  Result Date: 04/22/2022 CLINICAL DATA:  Delirium. EXAM: CT HEAD WITHOUT CONTRAST TECHNIQUE: Contiguous axial images were obtained from the base of the skull through the vertex without intravenous contrast. RADIATION DOSE REDUCTION: This exam was performed according to the departmental dose-optimization program which includes automated exposure control, adjustment of the mA and/or kV according to patient size and/or use of iterative reconstruction technique. COMPARISON:  MRI brain and CT head dated Apr 05, 2022. FINDINGS: Brain: No evidence of acute infarction, hemorrhage, hydrocephalus, extra-axial collection or mass lesion/mass effect. Stable atrophy and severe chronic microvascular ischemic changes. Vascular: Calcified atherosclerosis at the skull base. No hyperdense vessel. Skull: Normal. Negative for fracture or focal lesion. Sinuses/Orbits: No acute finding. Other: None. IMPRESSION: 1. No acute intracranial abnormality. 2. Stable atrophy and severe chronic microvascular ischemic changes. Electronically Signed   By: Titus Dubin M.D.   On: 04/22/2022 16:11      Ulysess Witz T. [place]  If 7PM-7AM, please contact night-coverage [email or web address] 04/23/2022, 1:35 PM

## 2022-04-23 NOTE — Progress Notes (Signed)
Successful image guided LP from L2-3 Opening pressure 20 cm H2O Approximately 9 mL clear colorless CSF removed. No complications.  Ascencion Dike PA-C Interventional Radiology 04/23/2022 12:18 PM

## 2022-04-23 NOTE — Progress Notes (Signed)
EEG complete - results pending 

## 2022-04-23 NOTE — Consult Note (Addendum)
Consultation Note Date: 04/23/2022   Patient Name: Holly Valdez  DOB: [birth year]  MRN: [id number]  Age / Sex: 86 y.o., female  PCP: Lucianne Lei, MD Referring Physician: Mercy Riding, MD  Reason for Consultation: Establishing goals of care, "Goal of care discussion.  Recurrent hospitalization."  HPI/Patient Profile: 86 y.o. female  with past medical history of severe dementia, CKD-3B, DM-2 with neuropathy, HTN, chronic lower back pain, recent hospitalization from 5/23-26 for acute metabolic encephalopathy in the setting of UTI, dehydration and possible anoxic brain injury as noted on MRI. Patient presented to ED on 04/22/22 from [facility] with worsening mental status for about 2 hours prior to presentation. Patient was admitted on 04/22/2022 with acute metabolic encephalopathy, SIRS, anoxic brain injury per MRI from last hospitalization, dementia with behavioral disturbance.    Clinical Assessment and Goals of Care: I have reviewed medical records including EPIC notes, labs, and imaging. Received report from primary RN - no acute concerns. Was NPO for procedure - unsure of oral intake at this time.  Received updates from Dr. Cyndia Skeeters -no improvement from yesterday.  11:30 AM Went to visit patient at bedside. Patient down for procedure - lumbar puncture.   2:15 PM Went to visit patient at bedside - no family/visitors present. Patient was lying in bed awake, alert, nonverbal, and unable to participate in conversation. No signs or non-verbal gestures of pain or discomfort noted. No respiratory distress, increased work of breathing, or secretions noted. Patient does not respond to questions.   2:42 PM Attempted to call son/Holly to discuss diagnosis, prognosis, GOC, EOL wishes, disposition, and options - no answer - confidential voicemail left and PMT phone number provided with request to return call.  3:30  PM Received notification son returned call.   5:15 PM Met with son/Holly via phone  to discuss diagnosis, prognosis, GOC, EOL wishes, disposition, and options.  I introduced Palliative Medicine as specialized medical care for people living with serious illness. It focuses on providing relief from the symptoms and stress of a serious illness. The goal is to improve quality of life for both the patient and the family.  We discussed a brief life review of the patient as well as functional and nutritional status.  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  She is divorced and has 2 biological sons and 1 adopted son.  2 years ago patient was living alone -at this time she was walking, driving sometimes, verbal, able to use cell phone.  It was at this time family started to notice a decline.  She was moved into an assisted living facility where began to have multiple falls and was ultimately moved to [facility] long-term care facility.  Patient is bedbound.  Son notes that this month he and staff have noted that she has been less talkative and not interested in eating or drinking.  Holly lives in [place] but did visit last week.  We discussed patient's current illness and what it means in the larger context of patient's on-going co-morbidities.  Educated that dementia is a progressive, non-curable disease underlying the patient's current acute medical conditions. We reviewed specific indicators of end stage dementia, including inability to communicate, bed bound/non-ambulatory status, decreased oral intake, swallowing dysfunction, and incontinence of bowel/bladder. We reviewed that patient's history is significant for gradually worsening progressive symptoms of end-stage dementia, especially considering scan/test thus far have been unrevealing for reversible cause of her decline. Natural disease trajectory and expectations at EOL were discussed. I  attempted to elicit values and goals of care important  to the patient. The difference between aggressive medical intervention and comfort care was considered in light of the patient's goals of care.  Prognostication was reviewed.  Reviewed with son that patient is at high risk for rehospitalization after discharge.  Provided education and counseling at length on the philosophy and benefits of hospice care. Discussed that it offers a holistic approach to care in the setting of end-stage illness, and is about supporting the patient where they are allowing nature to take it's course. Discussed the hospice team includes RNs, physicians, social workers, and chaplains. They can provide personal care, support for the family, and help keep patient out of the hospital as well as assist with DME needs for home hospice. Education provided on the difference between home vs residential hospice.   Reviewed hospice with long-term care in context of insurance to the best of my ability.  We talked about transition to comfort measures in house and what that would entail inclusive of medications to control pain, dyspnea, agitation, nausea, and itching. We discussed stopping all unnecessary measures such as blood draws, needle sticks, oxygen, antibiotics, CBGs/insulin, cardiac monitoring, IVF, and frequent vital signs.  Holly would like to take the information given to him today and discuss it with other family/friends before making final decisions.  He is open for PMT follow-up tomorrow for further support and for final decisions.   Discussed with patient/family the importance of continued conversation with each other and the medical providers regarding overall plan of care and treatment options, ensuring decisions are within the context of the patient's values and GOCs.    Questions and concerns were addressed. The patient/family was encouraged to call with questions and/or concerns. PMT number previously provided in VM.   Primary Decision Maker: NEXT OF KIN - Holly  Valdez/son - he lives in [place] and utilizes close family friend Art therapist for assistance as she lives in [place] current medical treatment Continue DNR/DNI as previously documented Son would like time to discuss comfort/hospice information provided today with family/friends prior to making final decision - son states he will have decisions tomorrow 6/11 and is open for PMT follow up PMT will continue to follow and support holistically   Code Status/Advance Care Planning: DNR  Palliative Prophylaxis:  Aspiration, Bowel Regimen, Delirium Protocol, Frequent Pain Assessment, Oral Care, and Turn Reposition  Additional Recommendations (Limitations, Scope, Preferences): Full Scope Treatment and No Tracheostomy  Psycho-social/Spiritual:  Desire for further Chaplaincy support:no Created space and opportunity for patient and family to express thoughts and feelings regarding patient's current medical situation.  Emotional support and therapeutic listening provided.  Prognosis:  < 6 months  Discharge Planning: To Be Determined      Primary Diagnoses: Present on Admission:  Acute metabolic encephalopathy  Anoxic brain injury ([place])  CKD-3B  Uncontrolled type 2 diabetes mellitus with hyperglycemia ([place])  Dementia with behavioral disturbance (H[facility])  Anemia in chronic kidney disease   I have reviewed the medical record, interviewed the patient and family, and examined the patient. The following aspects are pertinent.  Past Medical History:  Diagnosis Date   Dementia ([place])    Diabetes mellitus without complication ([place])    Hypertension    Social History   Socioeconomic History   Marital status: Single    Spouse name: Not on file   Number of children: Not on file   Years of education: Not on  file   Highest education level: Not on file  Occupational History   Not on file  Tobacco Use   Smoking status: Former   Smokeless tobacco:  Never  Substance and Sexual Activity   Alcohol use: Never   Drug use: Never   Sexual activity: Not on file  Other Topics Concern   Not on file  Social History Narrative   Not on file   Social Determinants of Health   Financial Resource Strain: Not on file  Food Insecurity: Not on file  Transportation Needs: Not on file  Physical Activity: Not on file  Stress: Not on file  Social Connections: Not on file   Family History  Family history unknown: Yes   Scheduled Meds:  heparin  5,000 Units Subcutaneous Q8H   insulin aspart  0-5 Units Subcutaneous QHS   insulin aspart  0-9 Units Subcutaneous TID WC   Continuous Infusions:  acyclovir 605 mg (04/23/22 0040)   cefTRIAXone (ROCEPHIN)  IV 2 g (04/23/22 1013)   lactated ringers 150 mL/hr at 04/22/22 2348   [START ON 04/24/2022] vancomycin     PRN Meds:.acetaminophen **OR** acetaminophen, ondansetron **OR** ondansetron (ZOFRAN) IV Medications Prior to Admission:  Prior to Admission medications   Medication Sig Start Date End Date Taking? Authorizing Provider  acetaminophen (TYLENOL) 650 MG CR tablet Take 650 mg by mouth every 6 (six) hours as needed for pain.   Yes [provider]  alum & mag hydroxide-simeth (MAALOX PLUS) 400-400-40 MG/5ML suspension Take 10 mLs by mouth every 4 (four) hours as needed for indigestion.   Yes [provider]  atorvastatin (LIPITOR) 40 MG tablet Take 40 mg by mouth at bedtime. 07/28/16  Yes [provider]  calcium carbonate (OSCAL) 1500 (600 Ca) MG TABS tablet Take 1,500 mg by mouth 2 (two) times daily.   Yes [provider]  Cholecalciferol 50 MCG (2000 UT) CAPS Take 2,000 Units by mouth daily.   Yes [provider]  Docusate Sodium (DSS) 100 MG CAPS Take 100 mg by mouth daily as needed (constipation). 06/03/20  Yes [provider]  DULoxetine HCl 30 MG CSDR Take 30 mg by mouth daily. 09/15/20  Yes [provider]  gabapentin (NEURONTIN) 300  MG capsule Take 300 mg by mouth 3 (three) times daily.   Yes [provider]  hydrALAZINE (APRESOLINE) 25 MG tablet Take 25 mg by mouth 3 (three) times daily. Hold if systolic is <109, diastolic <32 call NP if >355 01/10/19  Yes [provider]  insulin aspart (NOVOLOG) 100 UNIT/ML injection Inject 0-8 Units into the skin See admin instructions. Sliding scale  70-150 0 units  151-200 2 units  201-250 4 units  251-300 6 units  301-400 8 units If BS exceeds 200 call MD   Yes [provider]  insulin glargine (SEMGLEE) 100 UNIT/ML injection Inject 13 Units into the skin daily.   Yes [provider]  linagliptin (TRADJENTA) 5 MG TABS tablet Take 5 mg by mouth daily.   Yes [provider]  losartan (COZAAR) 100 MG tablet Take 100 mg by mouth daily. 02/13/18  Yes [provider]  melatonin 3 MG TABS tablet Take 3 mg by mouth at bedtime.   Yes [provider]  thiamine 100 MG tablet Take 1 tablet (100 mg total) by mouth daily. 04/09/22 05/09/22 Yes Antonieta Pert, MD  diclofenac Sodium (VOLTAREN) 1 % GEL Apply 4 g topically 4 (four) times daily. Patient not taking: Reported on 04/05/2022  03/10/21   Deno Etienne, DO  insulin glargine (LANTUS) 100 UNIT/ML Solostar Pen Inject 10 Units into the skin daily. Can increase to home dose slowly if sugar start to go up. Patient not taking: Reported on 04/22/2022 04/08/22 05/08/22  Antonieta Pert, MD   Allergies  Allergen Reactions   Penicillins Other (See Comments)    Unknown reaction - Tolerated keflex and ceftriaxone while hospitalized 09/2020   Review of Systems  Unable to perform ROS: Dementia    Physical Exam Vitals and nursing note reviewed.  Constitutional:      General: She is not in acute distress. Pulmonary:     Effort: No respiratory distress.  Skin:    General: Skin is warm and dry.  Neurological:     Mental Status: She is alert.     Motor: Weakness present.  Psychiatric:        Speech: She  is noncommunicative.     Vital Signs: BP (!) 156/64 (BP Location: Left Arm)   Pulse (!) 56   Temp 98.4 F (36.9 C) (Axillary)   Resp 19   Ht 5' 2" (1.575 m)   Wt 81.7 kg   SpO2 96%   BMI 32.94 kg/m      Pain Score: 0-No pain   SpO2: SpO2: 96 % O2 Device:SpO2: 96 % O2 Flow Rate: .   IO: Intake/output summary: No intake or output data in the 24 hours ending 04/23/22 1117  LBM: Last BM Date : 04/23/22 Baseline Weight: Weight: 75.8 kg Most recent weight: Weight: 81.7 kg     Palliative Assessment/Data: PPS 20-30%     Time In/Out: 1115-1130/1415-1445/1715-1800 Time Total: 90 minutes  Greater than 50%  of this time was spent counseling and coordinating care related to the above assessment and plan.  Signed by: Lin Landsman, NP   Please contact Palliative Medicine Team phone at [phone] for questions and concerns.  For individual provider: See Amion  *Portions of this note are a verbal dictation therefore any spelling and/or grammatical errors are due to the "[employer]" system interpretation.

## 2022-04-23 NOTE — Evaluation (Signed)
Clinical/Bedside Swallow Evaluation Patient Details  Name: Holly Valdez MRN: [id number] Date of Birth: September 05, 1936  Today's Date: 04/23/2022 Time: SLP Start Time (ACUTE ONLY): 1301 SLP Stop Time (ACUTE ONLY): 1320 SLP Time Calculation (min) (ACUTE ONLY): 19 min  Past Medical History:  Past Medical History:  Diagnosis Date   Dementia ([place])    Diabetes mellitus without complication ([place])    Hypertension    Past Surgical History: History reviewed. No pertinent surgical history. HPI:  86 y.o. female with PMH significant for  has a past medical history of Dementia ([place]), Diabetes mellitus without complication ([place]), and Hypertension.  Recent hospitalization from 5/23 through 26 for acute metabolic encephalopathy in setting of UTI, dehydration and possible anoxic brain injury as noted on MRI 04/05/22  She presents from SNF with worsening mental status for about 2 hours prior to presentation.  CT head and MRI brain negative for acute acute abnormality.  EEG-intermittent slow, generalized, no seizures or definite epileptiform discharges were seen    Assessment / Plan / Recommendation  Clinical Impression  Pt seen by ST services during recent admission. This date, mental function per chart review appears worsened. Pt was initially resting though able to arouse, open eyes, and state name with cueing by SLP. She was not responsive to following simple commands however. No overt s/sx of aspiration was exhibited with any POs (ice chips, thin liquids, puree, solids; including 3 oz water challenge). Recommend regular thin liquid diet with full supervision from staff and feeding assistance. Given worsened mental status will follow up for diet tolerance x1.  SLP Visit Diagnosis: Dysphagia, unspecified (R13.10)    Aspiration Risk  Mild aspiration risk    Diet Recommendation   Regular, thin liquids   Medication Administration: Whole meds with liquid    Other  Recommendations Oral Care Recommendations:  Oral care BID    Recommendations for follow up therapy are one component of a multi-disciplinary discharge planning process, led by the attending physician.  Recommendations may be updated based on patient status, additional functional criteria and insurance authorization.  Follow up Recommendations Other (comment) (TBD)      Assistance Recommended at Discharge Frequent or constant Supervision/Assistance  Functional Status Assessment    Frequency and Duration min 1 x/week  1 week       Prognosis Prognosis for Safe Diet Advancement: Good Barriers to Reach Goals: Cognitive deficits Barriers/Prognosis Comment: 1301      Swallow Study   General Date of Onset: 04/22/22 HPI: 86 y.o. female with PMH significant for  has a past medical history of Dementia ([place]), Diabetes mellitus without complication ([place]), and Hypertension.  Recent hospitalization from 5/23 through 26 for acute metabolic encephalopathy in setting of UTI, dehydration and possible anoxic brain injury as noted on MRI 04/05/22  She presents from SNF with worsening mental status for about 2 hours prior to presentation.  CT head and MRI brain negative for acute acute abnormality.  EEG-intermittent slow, generalized, no seizures or definite epileptiform discharges were seen Type of Study: Bedside Swallow Evaluation Previous Swallow Assessment: last admission 03/2022; grossly functional; regular, thin liquid diet Diet Prior to this Study: NPO Temperature Spikes Noted: No Respiratory Status: Room air History of Recent Intubation: No Behavior/Cognition: Lethargic/Drowsy;Requires cueing;Doesn't follow directions Oral Cavity Assessment: Other (comment) (pt did not follow commands for full visualization of oral cavity) Oral Care Completed by SLP: No Oral Cavity - Dentition: Dentures, top;Adequate natural dentition Vision: Functional for self-feeding Self-Feeding Abilities: Total assist Patient Positioning: Upright in bed  Baseline  Vocal Quality: Other (comment) (did not speak much, only able to state name) Volitional Cough: Cognitively unable to elicit Volitional Swallow: Unable to elicit    Oral/Motor/Sensory Function Overall Oral Motor/Sensory Function: Other (comment) (difficult to assess pt unable to follow commands)   Ice Chips Ice chips: Within functional limits Presentation: Spoon   Thin Liquid Thin Liquid: Within functional limits Presentation: Straw    Nectar Thick Nectar Thick Liquid: Not tested   Honey Thick Honey Thick Liquid: Not tested   Puree Puree: Within functional limits   Solid     Solid: Within functional limits Presentation: [place], CCC-SLP Acute Rehabilitation Services   04/23/2022,1:45 PM

## 2022-04-23 NOTE — Consult Note (Signed)
NEUROLOGY CONSULTATION NOTE   Date of service: April 23, 2022 Patient Name: Holly Valdez MRN:  [id number] DOB:  [birth year] Reason for consult: "Altered mental status" _ _ _   _ __   _ __ _ _  __ __   _ __   __ _  History of Present Illness  Holly Valdez is a 86 y.o. female with PMH significant for  has a past medical history of Dementia ([place]), Diabetes mellitus without complication ([place]), and Hypertension.  Recent hospitalization from 5/23 through 26 for acute metabolic encephalopathy in setting of UTI, dehydration and possible anoxic brain injury as noted on MRI 04/05/22  She presents from SNF with worsening mental status for about 2 hours prior to presentation.  CT head and MRI brain negative for acute acute abnormality.  EEG-intermittent slow, generalized, no seizures or definite epileptiform discharges were seen.   I spoke with her caregiver at length.  She has had dementia for quite some time, however after she got COVID approximately 2 years ago she had a fairly abrupt worsening.  She describes it as "like her mind is being gone" ever since.  She has had a steady progression getting worse and worse over time, has not been able to walk for quite some time.  She was, however, still able to have a conversation and feed her self, etc. up until she came to the hospital on May 23.  Without hospitalization she came in severely hypotensive and has been encephalopathic ever since.  An MRI at that time demonstrated evidence of hypoperfusion brain injury.  She has not significantly improved per her caregiver.  She has been holding her left hand kind of clenched and has not been able to have a conversation since that time.  Apparently she had some sort of worsening of her mental status which precipitated a reevaluation and readmission.  It appears she still has some degree of urinary tract infection and she is being treated for this.   An MRI of her brain was performed which demonstrates improvement in  the diffusion restriction that was seen a few weeks ago.   ROS   Constitutional  Unable to perform ROS due to AMS/Metabolic Encephalopathy  HEENT   Respiratory   CV   GI   GU   MSK   Skin   Neurological   Psychiatric    Past History   Past Medical History:  Diagnosis Date   Dementia ([place])    Diabetes mellitus without complication ([place])    Hypertension    History reviewed. No pertinent surgical history. Family History  Family history unknown: Yes   Social History   Socioeconomic History   Marital status: Single    Spouse name: Not on file   Number of children: Not on file   Years of education: Not on file   Highest education level: Not on file  Occupational History   Not on file  Tobacco Use   Smoking status: Former   Smokeless tobacco: Never  Substance and Sexual Activity   Alcohol use: Never   Drug use: Never   Sexual activity: Not on file  Other Topics Concern   Not on file  Social History Narrative   Not on file   Social Determinants of Health   Financial Resource Strain: Not on file  Food Insecurity: Not on file  Transportation Needs: Not on file  Physical Activity: Not on file  Stress: Not on file  Social Connections: Not on file  Allergies  Allergen Reactions   Penicillins Other (See Comments)    Unknown reaction - Tolerated keflex and ceftriaxone while hospitalized 09/2020    Medications   Medications Prior to Admission  Medication Sig Dispense Refill Last Dose   acetaminophen (TYLENOL) 650 MG CR tablet Take 650 mg by mouth every 6 (six) hours as needed for pain.   unk last dose   alum & mag hydroxide-simeth (MAALOX PLUS) 400-400-40 MG/5ML suspension Take 10 mLs by mouth every 4 (four) hours as needed for indigestion.   unk last dose   atorvastatin (LIPITOR) 40 MG tablet Take 40 mg by mouth at bedtime.   04/21/2022   calcium carbonate (OSCAL) 1500 (600 Ca) MG TABS tablet Take 1,500 mg by mouth 2 (two) times daily.   04/22/2022    Cholecalciferol 50 MCG (2000 UT) CAPS Take 2,000 Units by mouth daily.   04/22/2022   Docusate Sodium (DSS) 100 MG CAPS Take 100 mg by mouth daily as needed (constipation).   04/22/2022   DULoxetine HCl 30 MG CSDR Take 30 mg by mouth daily.   04/22/2022   gabapentin (NEURONTIN) 300 MG capsule Take 300 mg by mouth 3 (three) times daily.   04/22/2022   hydrALAZINE (APRESOLINE) 25 MG tablet Take 25 mg by mouth 3 (three) times daily. Hold if systolic is <829, diastolic <93 call NP if >716   04/22/2022   insulin aspart (NOVOLOG) 100 UNIT/ML injection Inject 0-8 Units into the skin See admin instructions. Sliding scale  70-150 0 units  151-200 2 units  201-250 4 units  251-300 6 units  301-400 8 units If BS exceeds 200 call MD   04/22/2022 at 2.30 pm   insulin glargine (SEMGLEE) 100 UNIT/ML injection Inject 13 Units into the skin daily.   04/22/2022   linagliptin (TRADJENTA) 5 MG TABS tablet Take 5 mg by mouth daily.   04/20/2022   losartan (COZAAR) 100 MG tablet Take 100 mg by mouth daily.   04/22/2022   melatonin 3 MG TABS tablet Take 3 mg by mouth at bedtime.   04/21/2022   thiamine 100 MG tablet Take 1 tablet (100 mg total) by mouth daily. 30 tablet 0 04/22/2022   diclofenac Sodium (VOLTAREN) 1 % GEL Apply 4 g topically 4 (four) times daily. (Patient not taking: Reported on 04/05/2022) 100 g 0 Not Taking   insulin glargine (LANTUS) 100 UNIT/ML Solostar Pen Inject 10 Units into the skin daily. Can increase to home dose slowly if sugar start to go up. (Patient not taking: Reported on 04/22/2022) 1 mL 0 Not Taking     Vitals   Vitals:   04/22/22 2032 04/22/22 2322 04/23/22 0400 04/23/22 0730  BP: (!) 148/82 (!) 141/68 (!) 123/57 (!) 156/64  Pulse: 99 70 65 (!) 56  Resp: 18 15 15 19   Temp: (!) 101 F (38.3 C) 99.8 F (37.7 C) 98.7 F (37.1 C) 98.4 F (36.9 C)  TempSrc: Axillary Axillary Axillary Axillary  SpO2: 96% 97% 96%   Weight: 81.7 kg     Height:         Body mass index is 32.94 kg/m.  Physical Exam    General: Laying comfortably in bed; in no acute distress.  HENT: Normal oropharynx and mucosa. Normal external appearance of ears and nose.  Neck: Supple, no grimace of pain or tenderness on palpation CV: RRR. No peripheral edema.  Pulmonary: Symmetric Chest rise. Normal respiratory effort.  Abdomen: Soft to touch, non-tender.  Ext: No cyanosis, edema,  or deformity  Skin: No rash. Normal palpation of skin.   Musculoskeletal: Normal digits and nails by inspection. No clubbing.   Neurologic Examination  Mental status/Cognition: Altered menatl status, confusion and drowsiness Speech/language: She is awake, relatively alert but appears aphasic, has perseveration. Cranial nerves:   CN II Pupils equal and reactive to light, no VF deficits , pt squeezed eyes shut to initial assessment   CN III,IV,VI EOM intact, no gaze preference or deviation, no nystagmus    CN V unable to assess   CN VII no asymmetry, no nasolabial fold flattening    CN VIII normal hearing to speech, opened eyes to my voice    CN IX & X normal palatal elevation, no uvular deviation    CN XI  unable to assess   CN XII midline tongue protrusion    Motor:  She moves all extremities spontaneously, follows commands, but gives poor effort throughout.  She has increased tone in her left arm keeping her left hand clenched.   Sensation:   Light touch Pt awoke to my voice and again to my touch on her arm   Pin prick Pt moaned and moved arm/leg away after pin prick            Coordination/Complex Motor:  -No clear ataxia  Labs   CBC:  Recent Labs  Lab 04/22/22 1515 04/23/22 0148  WBC 9.9 8.2  NEUTROABS 8.3*  --   HGB 11.7* 10.9*  HCT 36.1 33.3*  MCV 86.8 85.2  PLT 298 878    Basic Metabolic Panel:  Lab Results  Component Value Date   NA 133 (L) 04/23/2022   K 4.3 04/23/2022   CO2 20 (L) 04/23/2022   GLUCOSE 239 (H) 04/23/2022   BUN 16 04/23/2022   CREATININE 1.31 (H) 04/23/2022   CALCIUM 8.7 (L)  04/23/2022   GFRNONAA 40 (L) 04/23/2022   Lipid Panel: No results found for: "LDLCALC" HgbA1c:  Lab Results  Component Value Date   HGBA1C 9.9 (H) 04/06/2022   Urine Drug Screen:     Component Value Date/Time   LABOPIA NONE DETECTED 04/05/2022 0700   COCAINSCRNUR NONE DETECTED 04/05/2022 0700   LABBENZ NONE DETECTED 04/05/2022 0700   AMPHETMU NONE DETECTED 04/05/2022 0700   THCU NONE DETECTED 04/05/2022 0700   LABBARB NONE DETECTED 04/05/2022 0700    CT Head without contrast: No acute intracranial abnormality, stable atrophy and severe chronic microvascular ischemic changes  MRI Brain:  no acute intracranial abnormality, generalized cerebral volume loss and finding of chronic ischemic microangiopathy  rEEG: Suggestive of mild to moderate diffuse encephalopathy, nonspecific etiology but likely related to toxic-metabolic causes.  No seizures or definite epileptiform discharges were seen throughout the recording  LP results  RBC 48 WBC 3 Protein 83 Glucose 110   Thank you for the opportunity to take part in the care of this patient. If you have any further questions, please contact the neurology consultation attending.  Signed, Clarice Pole AGNP-BC  Impression   86 yo female patient with hx of severe dementia with recent admission for encephalopathy 04/05/22-04/08/22 in setting of UTI.  I have low suspicion for CNS infection, she does have an elevated protein, but I think that this is very nonspecific.  She has an HSV PCR that is pending, but I think that this is very low likelihood.  She was febrile on admission, and I suspect what ever caused her fever is what caused her mental status to worsen.  I think  that her hypoxic insult from late May coupled with her underlying dementia has been a major setback for her, and I suspect her to continue to gradually decline given her dementia.  Palliative care has been consulted, and I think that this is very  reasonable.   Recommendations  1) continue treatment of underlying medical stressors including possible UTI 2) given the elevated protein it is not unreasonable to continue acyclovir pending HSV PCR but my suspicion for this is very low 3) if she continues to have episodic mental status changes, could consider seizures as an etiology, but I have very little reason to suspect that at this time. 4) agree with palliative care consultation ______________________________________________________________________  Roland Rack, MD [facility] [phone]  If 7pm- 7am, please page neurology on call as listed in [place].

## 2022-04-24 DIAGNOSIS — Z515 Encounter for palliative care: Secondary | ICD-10-CM

## 2022-04-24 DIAGNOSIS — F03918 Unspecified dementia, unspecified severity, with other behavioral disturbance: Secondary | ICD-10-CM | POA: Diagnosis not present

## 2022-04-24 DIAGNOSIS — Z789 Other specified health status: Secondary | ICD-10-CM

## 2022-04-24 DIAGNOSIS — Z66 Do not resuscitate: Secondary | ICD-10-CM

## 2022-04-24 DIAGNOSIS — R638 Other symptoms and signs concerning food and fluid intake: Secondary | ICD-10-CM

## 2022-04-24 DIAGNOSIS — N1832 Chronic kidney disease, stage 3b: Secondary | ICD-10-CM | POA: Diagnosis not present

## 2022-04-24 DIAGNOSIS — N390 Urinary tract infection, site not specified: Secondary | ICD-10-CM

## 2022-04-24 DIAGNOSIS — R6510 Systemic inflammatory response syndrome (SIRS) of non-infectious origin without acute organ dysfunction: Secondary | ICD-10-CM | POA: Diagnosis not present

## 2022-04-24 DIAGNOSIS — Z711 Person with feared health complaint in whom no diagnosis is made: Secondary | ICD-10-CM

## 2022-04-24 DIAGNOSIS — G9341 Metabolic encephalopathy: Secondary | ICD-10-CM | POA: Diagnosis not present

## 2022-04-24 LAB — GLUCOSE, CAPILLARY
Glucose-Capillary: 168 mg/dL — ABNORMAL HIGH (ref 70–99)
Glucose-Capillary: 191 mg/dL — ABNORMAL HIGH (ref 70–99)
Glucose-Capillary: 296 mg/dL — ABNORMAL HIGH (ref 70–99)
Glucose-Capillary: 109 mg/dL — ABNORMAL HIGH (ref 70–99)

## 2022-04-24 LAB — RENAL FUNCTION PANEL
Albumin: 2.7 g/dL — ABNORMAL LOW (ref 3.5–5.0)
Anion gap: 9 (ref 5–15)
BUN: 15 mg/dL (ref 8–23)
CO2: 23 mmol/L (ref 22–32)
Calcium: 8.7 mg/dL — ABNORMAL LOW (ref 8.9–10.3)
Chloride: 102 mmol/L (ref 98–111)
Creatinine, Ser: 1.3 mg/dL — ABNORMAL HIGH (ref 0.44–1.00)
GFR, Estimated: 41 mL/min — ABNORMAL LOW (ref >60)
Glucose, Bld: 179 mg/dL — ABNORMAL HIGH (ref 70–99)
Phosphorus: 2.5 mg/dL (ref 2.5–4.6)
Potassium: 3.8 mmol/L (ref 3.5–5.1)
Sodium: 134 mmol/L — ABNORMAL LOW (ref 135–145)

## 2022-04-24 LAB — CBC
HCT: 30 % — ABNORMAL LOW (ref 36.0–46.0)
Hemoglobin: 9.7 g/dL — ABNORMAL LOW (ref 12.0–15.0)
MCH: 27.5 pg (ref 26.0–34.0)
MCHC: 32.3 g/dL (ref 30.0–36.0)
MCV: 85 fL (ref 80.0–100.0)
Platelets: 247 10*3/uL (ref 150–400)
RBC: 3.53 MIL/uL — ABNORMAL LOW (ref 3.87–5.11)
RDW: 14 % (ref 11.5–15.5)
WBC: 6.2 10*3/uL (ref 4.0–10.5)
nRBC: 0 % (ref 0.0–0.2)

## 2022-04-24 LAB — MAGNESIUM: Magnesium: 1.6 mg/dL — ABNORMAL LOW (ref 1.7–2.4)

## 2022-04-24 MED ORDER — INSULIN ASPART 100 UNIT/ML IJ SOLN
0.0000 [IU] | Freq: Three times a day (TID) | INTRAMUSCULAR | Status: DC
  Administered 2022-04-24: 8 [IU] via SUBCUTANEOUS
  Administered 2022-04-25: 3 [IU] via SUBCUTANEOUS
  Administered 2022-04-25: 5 [IU] via SUBCUTANEOUS
  Administered 2022-04-25: 3 [IU] via SUBCUTANEOUS

## 2022-04-24 MED ORDER — FLUCONAZOLE IN SODIUM CHLORIDE 200-0.9 MG/100ML-% IV SOLN
200.0000 mg | INTRAVENOUS | Status: DC
  Administered 2022-04-25: 200 mg via INTRAVENOUS
  Filled 2022-04-24 (×3): qty 100

## 2022-04-24 MED ORDER — MAGNESIUM SULFATE 2 GM/50ML IV SOLN
2.0000 g | Freq: Once | INTRAVENOUS | Status: AC
  Administered 2022-04-24: 2 g via INTRAVENOUS
  Filled 2022-04-24: qty 50

## 2022-04-24 NOTE — Progress Notes (Addendum)
Daily Progress Note   Patient Name: Holly Valdez       Date: 04/24/2022 DOB: 1936/07/03  Age: 86 y.o. MRN#: [id number] Attending Physician: Mercy Riding, MD Primary Care Physician: Lucianne Lei, MD Admit Date: 04/22/2022  Reason for Consultation/Follow-up: Establishing goals of care, "Goal of care discussion.  Recurrent hospitalization."  Subjective: Chart review performed.  Received updates from primary RN -no acute concerns.  Received update from primary nurse tech -reports patient did not eat breakfast this morning and has been sleeping majority of the day.  She will try again to feed patient lunch.  Went to visit patient at bedside -no family/visitors present.  Patient is lying in bed asleep -I did not attempt to wake her.  No signs or non-verbal gestures of pain or discomfort noted. No respiratory distress, increased work of breathing, or secretions noted.   12:30 PM Received notification that friend/Sherwyn called PMT phone.   Returned her call -she was able to speak with patient's son/[name] today regarding conversation that was held between him and PMT yesterday. Sherwyn requested to have conversation with PMT again today to ensure she had received all information and details. She explains she is patient's HCPOA - requested copy of document. Son Raquel Sarna stated to me yesterday that she "made decisions" and might be the one reaching out to PMT with final decision. No paperwork could be located in medical record but previous notes do state Sherwyn is HCPOA.   We discussed patient's current illness and what it means in the larger context of patient's on-going co-morbidities.  Educated that dementia is a progressive, non-curable disease underlying the patient's current acute medical conditions. We  reviewed specific indicators of end stage dementia, including inability to communicate, bed bound/non-ambulatory status, decreased oral intake, swallowing dysfunction, and incontinence of bowel/bladder. We reviewed that patient's history is significant for gradually worsening progressive symptoms of end-stage dementia, especially considering scan/test thus far have been unrevealing for reversible cause of her decline outside UTI. Natural disease trajectory and expectations at EOL were discussed. I attempted to elicit values and goals of care important to the patient. The difference between aggressive medical intervention and comfort care was considered in light of the patient's goals of care.  Prognostication was reviewed.   Reviewed that patient is at high risk for rehospitalization after discharge.  Provided education and counseling at length on the philosophy and benefits of hospice care. Discussed that it offers a holistic approach to care in the setting of end-stage illness, and is about supporting the patient where they are allowing nature to take it's course. Discussed the hospice team includes RNs, physicians, social workers, and chaplains. They can provide personal care, support for the family, and help keep patient out of the hospital as well as assist with DME needs for home hospice. Education provided on the difference between home vs residential hospice.    Reviewed hospice with long-term care in context of insurance to the best of my ability.  After discussion Shelbie Proctor is clear that she "wants her to be comfortable." She is agreeable for patient to return to [facility] with hospice services - if [facility] is an existing relationship with a hospice organization she is okay with whichever one that may be.  Goal will be to continue gentle medical interventions until discharge - Shelbie Proctor is okay with discharge when hospice services are in place.   Sherwyn expressed appreciation for phone  call today.  All questions and concerns addressed. Encouraged to call with questions and/or concerns. PMT card provided.  2:00 PM Attempted to call son/[name] to review conversation and decisions made by [facility] - no answer - confidential voicemail left and PMT phone number provided with request to return call.   Length of Stay: 1  Current Medications: Scheduled Meds:   heparin  5,000 Units Subcutaneous Q8H   insulin aspart  0-5 Units Subcutaneous QHS   insulin aspart  0-9 Units Subcutaneous TID WC   lidocaine (PF)  6 mL Subcutaneous Once   mouth rinse  15 mL Mouth Rinse BID    Continuous Infusions:  acyclovir 605 mg (04/23/22 1622)   cefTRIAXone (ROCEPHIN)  IV 2 g (04/23/22 2316)   fluconazole (DIFLUCAN) IV 200 mg (04/23/22 1443)   lactated ringers 150 mL/hr at 04/24/22 0543   magnesium sulfate bolus IVPB 2 g (04/24/22 0944)   vancomycin      PRN Meds: acetaminophen **OR** acetaminophen, ondansetron **OR** ondansetron (ZOFRAN) IV  Physical Exam Vitals and nursing note reviewed.  Constitutional:      General: She is not in acute distress. Pulmonary:     Effort: No respiratory distress.  Skin:    General: Skin is warm and dry.  Neurological:     Mental Status: She is lethargic.     Motor: Weakness present.             Vital Signs: BP 128/63 (BP Location: Left Arm)   Pulse 67   Temp 98 F (36.7 C) (Oral)   Resp 16   Ht 5\' 2"  (1.575 m)   Wt 81.7 kg   SpO2 100%   BMI 32.94 kg/m  SpO2: SpO2: 100 % O2 Device: O2 Device: Room Air O2 Flow Rate:    Intake/output summary:  Intake/Output Summary (Last 24 hours) at 04/24/2022 1030 Last data filed at 04/24/2022 1610 Gross per 24 hour  Intake 2864 ml  Output 850 ml  Net 2014 ml   LBM: Last BM Date : 04/23/22 Baseline Weight: Weight: 75.8 kg Most recent weight: Weight: 81.7 kg       Palliative Assessment/Data: PPS 20-30%      Patient Active Problem List   Diagnosis Date Noted   Obesity (BMI 30-39.9)  04/23/2022   Hyponatremia 04/23/2022   SIRS (systemic inflammatory response syndrome) ([place]) 04/22/2022   Goals of care, counseling/discussion 04/22/2022  Acute metabolic encephalopathy 47/42/5956   Lactic acidosis 38/75/6433   Complicated UTI (urinary tract infection) 04/05/2022   Anoxic brain injury ([place]) 04/05/2022   Elevated troponin level not due myocardial infarction 04/05/2022   Hypovolemia 10/11/2020   Uncontrolled type 2 diabetes mellitus with hyperglycemia ([place]) 10/11/2020   CKD-3B 10/11/2020   CKD (chronic kidney disease) stage 3, GFR 30-59 ml/min (HCC) 10/11/2020   Dementia with behavioral disturbance ([place]) 10/11/2020   UTI (urinary tract infection) 10/10/2020   Spinal stenosis 06/14/2019   Anemia in chronic kidney disease 01/15/2019   Diabetic polyneuropathy associated with diabetes mellitus due to underlying condition ([place]) 05/29/2018   Benign essential hypertension 02/20/2018   Diabetic retinopathy ([place]) 02/20/2018   Hyperlipidemia, unspecified 01/17/2017   Hypertrophic non-obstructive cardiomyopathy ([place]) 04/05/2013    Palliative Care Assessment & Plan   Patient Profile: 86 y.o. female  with past medical history of severe dementia, CKD-3B, DM-2 with neuropathy, HTN, chronic lower back pain, recent hospitalization from 5/23-26 for acute metabolic encephalopathy in the setting of UTI, dehydration and possible anoxic brain injury as noted on MRI. Patient presented to ED on 04/22/22 from [facility] with worsening mental status for about 2 hours prior to presentation. Patient was admitted on 04/22/2022 with acute metabolic encephalopathy, SIRS, anoxic brain injury per MRI from last hospitalization, dementia with behavioral disturbance.   Assessment: Principal Problem:   Acute metabolic encephalopathy Active Problems:   Uncontrolled type 2 diabetes mellitus with hyperglycemia (HCC)   CKD-3B   Dementia with behavioral disturbance (HCC)   Anoxic brain injury (HCC)    Anemia in chronic kidney disease   SIRS (systemic inflammatory response syndrome) (HCC)   Goals of care, counseling/discussion   Obesity (BMI 30-39.9)   Hyponatremia Concern about end-of-life  Recommendations/Plan: Continue gentle medical interventions Continue DNR/DNI as previously documented - durable DNR form completed and placed in shadow chart. 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." PMT will continue to follow and support holistically   Goals of Care and Additional Recommendations: Limitations on Scope of Treatment: Full Scope Treatment and No Tracheostomy  Code Status:    Code Status Orders  (From admission, onward)           Start     Ordered   04/22/22 1738  Do not attempt resuscitation (DNR)  Continuous       Question Answer Comment  In the event of cardiac or respiratory ARREST Do not call a "code blue"   In the event of cardiac or respiratory ARREST Do not perform Intubation, CPR, defibrillation or ACLS   In the event of cardiac or respiratory ARREST Use medication by any route, position, wound care, and other measures to relive pain and suffering. May use oxygen, suction and manual treatment of airway obstruction as needed for comfort.      04/22/22 1738           Code Status History     Date Active Date Inactive Code Status Order ID Comments User Context   04/05/2022 0905 04/08/2022 2028 DNR [id number]  Vernelle Emerald, MD ED   10/10/2020 [phone] 10/15/2020 2337 DNR [id number]  Gaylan Gerold, DO  ED   10/10/2020 0759 10/10/2020 0833 Full Code [id number]  Mitzi Hansen, MD ED      Advance Directive  Documentation    [place]ardo Most Recent Value  Type of Advance Directive Out of facility DNR (pink MOST or yellow form)  Pre-existing out of facility DNR order (yellow form or pink MOST form) --  "MOST" Form in Place? --       Prognosis:  < 6 months would not be surprising   Discharge Planning: Home with Hospice  Care plan was discussed with primary RN, primary NT, patient's HCPOA, TOC, Dr. Cyndia Skeeters  Thank you for allowing the Palliative Medicine Team to assist in the care of this patient.   Total Time 75 minutes Prolonged Time Billed  yes      Greater than 50%  of this time was spent counseling and coordinating care related to the above assessment and plan.  Lin Landsman, NP  Please contact Palliative Medicine Team phone at [phone] for questions and concerns.   *Portions of this note are a verbal dictation therefore any spelling and/or grammatical errors are due to the "[employer]" system interpretation.

## 2022-04-24 NOTE — Progress Notes (Addendum)
PROGRESS NOTE  Holly Valdez TMH:[id number] DOB: 01/16/1936   PCP: Lucianne Lei, MD  Patient is from: SNF  DOA: 04/22/2022 LOS: 1  Chief complaints Chief Complaint  Patient presents with   Altered Mental Status     Brief Narrative / Interim history: 86 year old F with PMH of severe dementia, CKD-3B, DM-2 with neuropathy, HTN, chronic lower back pain, recent hospitalization from 5/23-26 for acute metabolic encephalopathy in the setting of UTI, dehydration and possible anoxic brain injury as noted on MRI, returning from SNF with worsening mental status for about 2 hours prior to presentation.  She was febrile to 102.2 F but no leukocytosis.  UA with pyuria and rare bacteria.  MRI brain without acute finding.  Cultures obtained.  LP ordered.  Started on vancomycin and cefepime, and admitted for acute metabolic encephalopathy.  The next day, neurology consulted.  Patient underwent fluoroscopy guided lumbar puncture. CSF studies ruled out bacterial meningitis but elevated protein.  Blood cultures NGTD.  Urine culture with 60,000 colonies of yeast.  Diflucan added.  Antibiotics discontinued.  She remains on acyclovir pending CSF HSV PCR although suspicion is low.  EEG negative for seizure or epileptiform discharge.   Patient's encephalopathy improved.  She is awake and alert but only oriented to self.  Follows some commands.  Neurology signed off.  Palliative medicine following.  Subjective: Seen and examined earlier this morning and this afternoon.  No major events overnight of this morning.  She is awake and more alert.  She is oriented to self and follows commands.  She is interactive and smiling.  She seems to be on the phone with his son.  She calls him cousin.   Objective: Vitals:   04/24/22 0018 04/24/22 0417 04/24/22 0823 04/24/22 1212  BP: (!) 147/72 (!) 154/79 128/63 (!) 121/57  Pulse: 90 75 67 65  Resp: 20 13 16 18   Temp: 97.8 F (36.6 C) 97.8 F (36.6 C) 98 F (36.7 C) 98 F  (36.7 C)  TempSrc: Oral Axillary Oral Oral  SpO2:   100% 97%  Weight:      Height:        Examination:  GENERAL: No apparent distress.  Nontoxic. HEENT: MMM.  Vision and hearing grossly intact.  NECK: Supple.  No apparent JVD.  RESP:  No IWOB.  Fair aeration bilaterally. CVS:  RRR. Heart sounds normal.  ABD/GI/GU: BS+. Abd soft, NTND.  MSK/EXT:  Moves extremities. No apparent deformity. No edema.  SKIN: no apparent skin lesion or wound NEURO: Awake and alert.  Oriented to self.  Follows commands.  No apparent focal neuro deficit. PSYCH: Calm. Normal affect.   Procedures:  6/10-fluoroscopy guided LP  Microbiology summarized: 6/9-COVID-19 and influenza PCR nonreactive 6/9-blood cultures NGTD 6/9-urine culture with 60k colonies of yeast 6/9-MRSA PCR screen negative. 6/10-CSF culture NGTD.  Assessment and plan: Principal Problem:   Acute metabolic encephalopathy Active Problems:   Goals of care, counseling/discussion   CKD-3B   Anoxic brain injury ([place])   Uncontrolled type 2 diabetes mellitus with hyperglycemia (HCC)   Dementia with behavioral disturbance (HCC)   Anemia in chronic kidney disease   SIRS (systemic inflammatory response syndrome) (HCC)   Obesity (BMI 30-39.9)   Hyponatremia  Acute metabolic encephalopathy in patient with history of severe dementia: unclear etiology yet.  She has fever but no leukocytosis.  Infectious work-up unrevealing except for some yeast in the urine culture probably from antibiotics for recurrent UTI.  Previous MRI concerning for ABI.  MRI brain  this admission without acute finding.  Her recent TSH was normal.  Ammonia and B12 within normal as well.  EEG negative for seizure or epileptiform discharge.  Some improvement in mental status.  She is awake and alert.  Oriented to self.  Follows some commands.  Probably her baseline. -S/p lumbar puncture -Discontinue vancomycin and ceftriaxone. -Continue acyclovir pending CSF HSV PCR although  suspicion is low. -Diflucan 200 mg daily for 1 week for possible candiduria -Upgraded to regular diet by SLP. -Aspiration precaution. -Reorientation and delirium precautions -Palliative medicine following  Goals of care, counseling/discussion: DNR/DNI.  Initially, her son in a different state claimed HCPOA.  Now caregiver at bedside states that he is her step son, and she is HCPOA.  She says she has papers that she she can bring. -Palliative medicine following.  Plan is discharged back to [facility] with hospice follow-up.   CKD-3B: Stable. Recent Labs    04/05/22 0048 04/05/22 0056 04/06/22 0254 04/07/22 0508 04/22/22 1515 04/23/22 0148 04/24/22 0214  BUN 31* 30* 21 12 16 16 15   CREATININE 2.09* 2.10* 1.50* 1.31* 1.38* 1.31* 1.30*  -Continue IV fluid and monitor   SIRS (systemic inflammatory response syndrome) ([place]): had fever, mild tachycardia and mild tachypnea on presentation. Unclear source of infection except some yeast in the urine.   -See encephalopathy   Anemia in chronic kidney disease: Stable. Recent Labs    04/05/22 0048 04/05/22 0056 04/05/22 0057 04/06/22 0254 04/07/22 0508 04/22/22 1515 04/23/22 0148 04/24/22 0214  HGB 9.7* 10.2* 10.2* 9.6* 11.0* 11.7* 10.9* 9.7*  -Continue monitoring   Dementia with behavioral disturbance ([place]): seems to be very advanced.   -See encephalopathy   Uncontrolled type 2 diabetes mellitus with hyperglycemia ([place]): A1c 9.9% 2 weeks ago.  She has hyperglycemia but not in hyperglycemic crisis. Recent Labs  Lab 04/23/22 1403 04/23/22 1551 04/23/22 2116 04/24/22 0826 04/24/22 1217  GLUCAP 171* 170* 122* 168* 191*  -Increase SSI to moderate  Hypomagnesemia: Mg 1.6. -IV magnesium sulfate 2 g x 1  Obesity Body mass index is 32.94 kg/m.         DVT prophylaxis:  Place and maintain sequential compression device Start: 04/23/22 0738 heparin injection 5,000 Units Start: 04/22/22 2200  Code Status: DNR/DNI Family  Communication: Updated patient's son over the phone and caregiver at the bedside. Level of care: Telemetry Medical Status is: Inpatient The patient will remain inpatient because: Acute encephalopathy and IV acyclovir pending CSF HSV PCR.   Final disposition: SNF/LTC with hospice Consultants:  Neurology-signed off Palliative medicine  Sch Meds:  Scheduled Meds:  heparin  5,000 Units Subcutaneous Q8H   insulin aspart  0-15 Units Subcutaneous TID WC   lidocaine (PF)  6 mL Subcutaneous Once   mouth rinse  15 mL Mouth Rinse BID   Continuous Infusions:  acyclovir 605 mg (04/23/22 1622)   [START ON 04/25/2022] fluconazole (DIFLUCAN) IV     lactated ringers 100 mL/hr at 04/24/22 1323   PRN Meds:.acetaminophen **OR** acetaminophen, ondansetron **OR** ondansetron (ZOFRAN) IV  Antimicrobials: Anti-infectives (From admission, onward)    Start     Dose/Rate Route Frequency Ordered Stop   04/25/22 1430  fluconazole (DIFLUCAN) IVPB 200 mg        200 mg 100 mL/hr over 60 Minutes Intravenous Every 24 hours 04/24/22 1351 04/30/22 1429   04/24/22 1400  vancomycin (VANCOREADY) IVPB 1750 mg/350 mL  Status:  Discontinued        1,750 mg 175 mL/hr over 120 Minutes Intravenous  Every 48 hours 04/22/22 1638 04/24/22 1045   04/23/22 1525  ceFEPIme (MAXIPIME) 2 g in sodium chloride 0.9 % 100 mL IVPB  Status:  Discontinued        2 g 200 mL/hr over 30 Minutes Intravenous Every 24 hours 04/22/22 1638 04/22/22 1738   04/23/22 1430  fluconazole (DIFLUCAN) IVPB 200 mg  Status:  Discontinued        200 mg 100 mL/hr over 60 Minutes Intravenous Every 24 hours 04/23/22 1337 04/24/22 1351   04/22/22 2200  cefTRIAXone (ROCEPHIN) 2 g in sodium chloride 0.9 % 100 mL IVPB  Status:  Discontinued        2 g 200 mL/hr over 30 Minutes Intravenous Every 12 hours 04/22/22 1738 04/24/22 1045   04/22/22 1800  acyclovir (ZOVIRAX) 605 mg in dextrose 5 % 100 mL IVPB        10 mg/kg  60.4 kg (Adjusted) 112.1 mL/hr over 60  Minutes Intravenous Every 24 hours 04/22/22 1752     04/22/22 1345  vancomycin (VANCOREADY) IVPB 1750 mg/350 mL        1,750 mg 175 mL/hr over 120 Minutes Intravenous  Once 04/22/22 1331 04/22/22 1822   04/22/22 1345  ceFEPIme (MAXIPIME) 2 g in sodium chloride 0.9 % 100 mL IVPB  Status:  Discontinued        2 g 200 mL/hr over 30 Minutes Intravenous Every 12 hours 04/22/22 1331 04/22/22 1638   04/22/22 1330  ceFEPIme (MAXIPIME) 2 g in sodium chloride 0.9 % 100 mL IVPB  Status:  Discontinued        2 g 200 mL/hr over 30 Minutes Intravenous  Once 04/22/22 1326 04/22/22 1331        I have personally reviewed the following labs and images: CBC: Recent Labs  Lab 04/22/22 1515 04/23/22 0148 04/24/22 0214  WBC 9.9 8.2 6.2  NEUTROABS 8.3*  --   --   HGB 11.7* 10.9* 9.7*  HCT 36.1 33.3* 30.0*  MCV 86.8 85.2 85.0  PLT 298 258 247   BMP &GFR Recent Labs  Lab 04/22/22 1515 04/23/22 0148 04/24/22 0214  NA 135 133* 134*  K 4.6 4.3 3.8  CL 101 103 102  CO2 25 20* 23  GLUCOSE 275* 239* 179*  BUN 16 16 15   CREATININE 1.38* 1.31* 1.30*  CALCIUM 9.3 8.7* 8.7*  MG  --   --  1.6*  PHOS  --   --  2.5   Estimated Creatinine Clearance: 32.5 mL/min (A) (by C-G formula based on SCr of 1.3 mg/dL (H)). Liver & Pancreas: Recent Labs  Lab 04/22/22 1515 04/23/22 0148 04/24/22 0214  AST 19 25  --   ALT 17 14  --   ALKPHOS 58 50  --   BILITOT 0.6 0.7  --   PROT 6.9 6.3*  --   ALBUMIN 3.4* 3.0* 2.7*   Recent Labs  Lab 04/22/22 1515  LIPASE 35   Recent Labs  Lab 04/22/22 1515  AMMONIA <10   Diabetic: No results for input(s): "HGBA1C" in the last 72 hours. Recent Labs  Lab 04/23/22 1403 04/23/22 1551 04/23/22 2116 04/24/22 0826 04/24/22 1217  GLUCAP 171* 170* 122* 168* 191*   Cardiac Enzymes: No results for input(s): "CKTOTAL", "CKMB", "CKMBINDEX", "TROPONINI" in the last 168 hours. No results for input(s): "PROBNP" in the last 8760 hours. Coagulation Profile: Recent  Labs  Lab 04/22/22 1515  INR 1.1   Thyroid Function Tests: No results for input(s): "TSH", "T4TOTAL", "FREET4", "  T3FREE", "THYROIDAB" in the last 72 hours. Lipid Profile: No results for input(s): "CHOL", "HDL", "LDLCALC", "TRIG", "CHOLHDL", "LDLDIRECT" in the last 72 hours. Anemia Panel: Recent Labs    04/22/22 2112  VITAMINB12 601   Urine analysis:    Component Value Date/Time   COLORURINE YELLOW 04/22/2022 1600   APPEARANCEUR CLOUDY (A) 04/22/2022 1600   LABSPEC 1.020 04/22/2022 1600   PHURINE 6.0 04/22/2022 1600   GLUCOSEU >=500 (A) 04/22/2022 1600   HGBUR NEGATIVE 04/22/2022 1600   BILIRUBINUR NEGATIVE 04/22/2022 1600   KETONESUR NEGATIVE 04/22/2022 1600   PROTEINUR 100 (A) 04/22/2022 1600   NITRITE NEGATIVE 04/22/2022 1600   LEUKOCYTESUR LARGE (A) 04/22/2022 1600   Sepsis Labs: Invalid input(s): "PROCALCITONIN", "LACTICIDVEN"  Microbiology: Recent Results (from the past 240 hour(s))  Urine Culture     Status: Abnormal   Collection Time: 04/22/22  1:12 PM   Specimen: Urine, Clean Catch  Result Value Ref Range Status   Specimen Description URINE, CLEAN CATCH  Final   Special Requests NONE  Final   Culture (A)  Final    60,000 COLONIES/mL YEAST WITHIN MIXED ORGANISMS Performed at [facility], [address], [place], [place] [address]    Report Status 04/23/2022 FINAL  Final  Blood culture (routine x 2)     Status: None (Preliminary result)   Collection Time: 04/22/22  1:12 PM   Specimen: BLOOD  Result Value Ref Range Status   Specimen Description BLOOD BLOOD LEFT ARM  Final   Special Requests   Final    BOTTLES DRAWN AEROBIC AND ANAEROBIC Blood Culture results may not be optimal due to an inadequate volume of blood received in culture bottles   Culture   Final    NO GROWTH < 24 HOURS Performed at [facility], [place] [address], [place], [place] [address]    Report Status PENDING  Incomplete  Resp Panel by RT-PCR (Flu A&B, Covid)     Status:  None   Collection Time: 04/22/22  1:26 PM   Specimen: Nasal Swab  Result Value Ref Range Status   SARS Coronavirus 2 by RT PCR NEGATIVE NEGATIVE Final    Comment: (NOTE) SARS-CoV-2 target nucleic acids are NOT DETECTED.  The SARS-CoV-2 RNA is generally detectable in upper respiratory specimens during the acute phase of infection. The lowest concentration of SARS-CoV-2 viral copies this assay can detect is 138 copies/mL. A negative result does not preclude SARS-Cov-2 infection and should not be used as the sole basis for treatment or other patient management decisions. A negative result may occur with  improper specimen collection/handling, submission of specimen other than nasopharyngeal swab, presence of viral mutation(s) within the areas targeted by this assay, and inadequate number of viral copies(<138 copies/mL). A negative result must be combined with clinical observations, patient history, and epidemiological information. The expected result is Negative.  Fact Sheet for Patients:  EntrepreneurPulse.com.au  Fact Sheet for Healthcare Providers:  IncredibleEmployment.be  This test is no t yet approved or cleared by the [place] FDA and  has been authorized for detection and/or diagnosis of SARS-CoV-2 by FDA under an Emergency Use Authorization (EUA). This EUA will remain  in effect (meaning this test can be used) for the duration of the COVID-19 declaration under Section 564(b)(1) of the Act, 21 U.S.C.section 360bbb-3(b)(1), unless the authorization is terminated  or revoked sooner.       Influenza A by PCR NEGATIVE NEGATIVE Final   Influenza B by PCR NEGATIVE NEGATIVE Final  Comment: (NOTE) The Xpert Xpress SARS-CoV-2/FLU/RSV plus assay is intended as an aid in the diagnosis of influenza from Nasopharyngeal swab specimens and should not be used as a sole basis for treatment. Nasal washings and aspirates are unacceptable for  Xpert Xpress SARS-CoV-2/FLU/RSV testing.  Fact Sheet for Patients: EntrepreneurPulse.com.au  Fact Sheet for Healthcare Providers: IncredibleEmployment.be  This test is not yet approved or cleared by the [place] FDA and has been authorized for detection and/or diagnosis of SARS-CoV-2 by FDA under an Emergency Use Authorization (EUA). This EUA will remain in effect (meaning this test can be used) for the duration of the COVID-19 declaration under Section 564(b)(1) of the Act, 21 U.S.C. section 360bbb-3(b)(1), unless the authorization is terminated or revoked.  Performed at [facility], [place] [address] [place], [place] [address]   MRSA Next Gen by PCR, Nasal     Status: None   Collection Time: 04/22/22  1:46 PM   Specimen: Nasal Swab  Result Value Ref Range Status   MRSA by PCR Next Gen NOT DETECTED NOT DETECTED Final    Comment: (NOTE) The GeneXpert MRSA Assay (FDA approved for NASAL specimens only), is one component of a comprehensive MRSA colonization surveillance program. It is not intended to diagnose MRSA infection nor to guide or monitor treatment for MRSA infections. Test performance is not FDA approved in patients less than 47 years old. Performed at [facility], [place] [address] [place], [place] [address]   Blood culture (routine x 2)     Status: None (Preliminary result)   Collection Time: 04/22/22  2:08 PM   Specimen: BLOOD  Result Value Ref Range Status   Specimen Description BLOOD BLOOD RIGHT ARM  Final   Special Requests   Final    BOTTLES DRAWN AEROBIC AND ANAEROBIC Blood Culture results may not be optimal due to an inadequate volume of blood received in culture bottles   Culture   Final    NO GROWTH < 24 HOURS Performed at [facility], [place] [address] [place], [place] [address]    Report Status PENDING  Incomplete  CSF culture w Gram Stain     Status: None (Preliminary result)   Collection Time:  04/23/22 12:29 PM   Specimen: PATH Cytology CSF; Cerebrospinal Fluid  Result Value Ref Range Status   Specimen Description CSF  Final   Special Requests NONE  Final   Gram Stain   Final    WBC PRESENT, PREDOMINANTLY PMN NO ORGANISMS SEEN CYTOSPIN SMEAR    Culture   Final    NO GROWTH < 24 HOURS Performed at [facility], [place] [address], [place], [place] [address]    Report Status PENDING  Incomplete    Radiology Studies: No results found.    Dylon Correa T. [place]  If 7PM-7AM, please contact night-coverage [email or web address] 04/24/2022, 3:19 PM

## 2022-04-24 NOTE — Progress Notes (Deleted)
Neurology Progress Note Holly Valdez MR# [id number] 04/24/2022   S: No overnight events; No new complaints. She was asleep and easily awakens to voice. She was alert and oriented to self only. Continues to perseverate. Exam improved from when I saw her yesterday around 1pm.    O: Current vital signs: BP 128/63 (BP Location: Left Arm)   Pulse 67   Temp 98 F (36.7 C) (Oral)   Resp 16   Ht 5\' 2"  (1.575 m)   Wt 81.7 kg   SpO2 100%   BMI 32.94 kg/m  Vital signs in last 24 hours: Temp:  [97.8 F (36.6 C)-99.1 F (37.3 C)] 98 F (36.7 C) (06/11 0823) Pulse Rate:  [67-90] 67 (06/11 0823) Resp:  [13-20] 16 (06/11 0823) BP: (128-166)/(63-82) 128/63 (06/11 0823) SpO2:  [100 %] 100 % (06/11 0823)  General: Laying comfortably in bed; in no acute distress.  HENT: Normal oropharynx and mucosa. Normal external appearance of ears and nose.  Neck: Supple, no grimace of pain or tenderness on palpation CV: RRR. No peripheral edema.  Pulmonary: Symmetric Chest rise. Normal respiratory effort.  Abdomen: Soft to touch, non-tender.  Ext: No cyanosis, edema, or deformity  Skin: No rash. Normal palpation of skin.   Musculoskeletal: Normal digits and nails by inspection. No clubbing.    Neurologic Examination  Mental status/Cognition: Altered menatl status, confusion and drowsiness Speech/language: She is awake and alert but still has perseveration. Cranial nerves:   CN II Pupils equal and reactive to light, no VF deficits , pt squeezed eyes shut to initial assessment   CN III,IV,VI EOM intact, no gaze preference or deviation, no nystagmus    CN V unable to assess   CN VII no asymmetry, no nasolabial fold flattening    CN VIII normal hearing to speech, opened eyes to my voice    CN IX & X normal palatal elevation, no uvular deviation    CN XI  unable to assess   CN XII midline tongue protrusion     Motor:  She moves all extremities spontaneously, follows commands, but gives poor effort  throughout.  She has increased tone in her left arm keeping her left hand clenched.     Sensation: No deficits noted on exam  Light touch Pt awoke to my voice and again to my touch on her arm   Pin prick Pt grimaced and moved arm/leg away after pin prick   Coordination/Complex Motor:  -No clear ataxia    Labs     Component Value Date/Time   WBC 6.2 04/24/2022 0214   RBC 3.53 (L) 04/24/2022 0214   HGB 9.7 (L) 04/24/2022 0214   HCT 30.0 (L) 04/24/2022 0214   PLT 247 04/24/2022 0214   MCV 85.0 04/24/2022 0214   MCH 27.5 04/24/2022 0214   MCHC 32.3 04/24/2022 0214   RDW 14.0 04/24/2022 0214   LYMPHSABS 0.8 04/22/2022 1515   MONOABS 0.7 04/22/2022 1515   EOSABS 0.0 04/22/2022 1515   BASOSABS 0.0 04/22/2022 1515       Component Value Date/Time   NA 134 (L) 04/24/2022 0214   K 3.8 04/24/2022 0214   CL 102 04/24/2022 0214   CO2 23 04/24/2022 0214   GLUCOSE 179 (H) 04/24/2022 0214   BUN 15 04/24/2022 0214   CREATININE 1.30 (H) 04/24/2022 0214   CALCIUM 8.7 (L) 04/24/2022 0214   PROT 6.3 (L) 04/23/2022 0148   ALBUMIN 2.7 (L) 04/24/2022 0214   AST 25 04/23/2022 0148  ALT 14 04/23/2022 0148   ALKPHOS 50 04/23/2022 0148   BILITOT 0.7 04/23/2022 0148   GFRNONAA 41 (L) 04/24/2022 0214    No results found for: "CHOL", "TRIG", "HDL", "CHOLHDL", "VLDL", "LDLCALC", "LDLDIRECT"  Medications: Scheduled Meds:  heparin  5,000 Units Subcutaneous Q8H   insulin aspart  0-5 Units Subcutaneous QHS   insulin aspart  0-9 Units Subcutaneous TID WC   lidocaine (PF)  6 mL Subcutaneous Once   mouth rinse  15 mL Mouth Rinse BID   Continuous Infusions:  acyclovir 605 mg (04/23/22 1622)   fluconazole (DIFLUCAN) IV 200 mg (04/23/22 1443)   lactated ringers 150 mL/hr at 04/24/22 0543   PRN Meds:.acetaminophen **OR** acetaminophen, ondansetron **OR** ondansetron (ZOFRAN) IV  Imaging I have reviewed images in epic and the results pertinent to this consultation are: CT Head without  contrast: No acute intracranial abnormality, stable atrophy and severe chronic microvascular ischemic changes   MRI Brain:  no acute intracranial abnormality, generalized cerebral volume loss and finding of chronic ischemic microangiopathy   rEEG: Suggestive of mild to moderate diffuse encephalopathy, nonspecific etiology but likely related to toxic-metabolic causes.  No seizures or definite epileptiform discharges were seen throughout the recording  Assessment: Holly Valdez is a 86 y.o. female PMHx significant for  has a past medical history of Dementia ([place]), Diabetes mellitus without complication ([place]), and Hypertension.  Recent hospitalization from 5/23 through 26 for acute metabolic encephalopathy in setting of UTI, dehydration and possible anoxic brain injury as noted on MRI 04/05/22  She presents from SNF with worsening mental status for about 2 hours prior to presentation.  CT head and MRI brain negative for acute acute abnormality.  EEG-intermittent slow, generalized, no seizures or definite epileptiform discharges were seen.   Yesterday Dr Leonel Ramsay spoke with her caregiver at length.  She has had dementia for quite some time, however after she got COVID approximately 2 years ago she had a fairly abrupt worsening.  She describes it as "like her mind is being gone" ever since.  She has had a steady progression getting worse and worse over time, has not been able to walk for quite some time.  She was, however, still able to have a conversation and feed her self, etc. up until she came to the hospital on May 23.  Without hospitalization she came in severely hypotensive and has been encephalopathic ever since.  An MRI at that time demonstrated evidence of hypoperfusion brain injury.  She has not significantly improved per her caregiver.  She has been holding her left hand kind of clenched and has not been able to have a conversation since that time.  Apparently she had some sort of worsening of  her mental status which precipitated a reevaluation and readmission.  It appears she still has some degree of urinary tract infection and she is being treated for this.  An MRI of her brain was performed which demonstrates improvement in the diffusion restriction that was seen a few weeks ago.   Impression: Patient with hx of severe dementia with recent admission for encephalopathy 04/05/22-04/08/22 in setting of UTI. As previously discussed, low suspicion for CNS with nonspecific elevated protein. HSV PCR still pending although low suspicion. She was febrile on admission, and likely what ever caused her fever is what caused her mental status to worsen.  Her hypoxic insult from late May coupled with her underlying dementia has been a major setback for her, and likely she will continue to gradually decline given  her dementia.   Palliative care has been consulted, and I think that this is very reasonable.  Recommendations: 1) continue treatment of underlying medical stressors including possible UTI 2) given the elevated protein it is not unreasonable to continue acyclovir pending HSV PCR but my suspicion for this is very low 3) if she continues to have episodic mental status changes, could consider seizures as an etiology, but I have very little reason to suspect that at this time. 4) agree with palliative care consultation and family to discuss amongst themselves and decide GOC today  Neurology will sign off with above recommendations.   Thank you for the opportunity to take part in the care of this patient. If you have any further questions, please contact the neurology consultation attending.  Electronically signed by:  Parke Poisson, Neurology NP Can be reached on Epic Secure Messenger 04/24/2022, 11:02 AM  If 7pm- 7am, please page neurology on call as listed in [place].

## 2022-04-24 NOTE — Progress Notes (Signed)
Neurology Progress Note Holly Valdez MR# [id number] 04/24/2022   S: No overnight events; No new complaints. She was asleep and easily awakens to voice. She was alert and oriented to self only. Continues to perseverate. Exam improved from when I saw her yesterday around 1pm.    O: Current vital signs: BP (!) 121/57 (BP Location: Right Arm)   Pulse 65   Temp 98 F (36.7 C) (Oral)   Resp 18   Ht 5\' 2"  (1.575 m)   Wt 81.7 kg   SpO2 97%   BMI 32.94 kg/m  Vital signs in last 24 hours: Temp:  [97.8 F (36.6 C)-99.1 F (37.3 C)] 98 F (36.7 C) (06/11 1212) Pulse Rate:  [65-90] 65 (06/11 1212) Resp:  [13-20] 18 (06/11 1212) BP: (121-166)/(57-82) 121/57 (06/11 1212) SpO2:  [97 %-100 %] 97 % (06/11 1212)  General: Laying comfortably in bed; in no acute distress.  HENT: Normal oropharynx and mucosa. Normal external appearance of ears and nose.  Neck: Supple, no grimace of pain or tenderness on palpation CV: RRR. No peripheral edema.  Pulmonary: Symmetric Chest rise. Normal respiratory effort.  Abdomen: Soft to touch, non-tender.  Ext: No cyanosis, edema, or deformity  Skin: No rash. Normal palpation of skin.   Musculoskeletal: Normal digits and nails by inspection. No clubbing.    Neurologic Examination  Mental status/Cognition: Altered menatl status, confusion and drowsiness Speech/language: She is awake and alert but still has perseveration. Cranial nerves:   CN II Pupils equal and reactive to light, no VF deficits , pt squeezed eyes shut to initial assessment   CN III,IV,VI EOM intact, no gaze preference or deviation, no nystagmus    CN V unable to assess   CN VII no asymmetry, no nasolabial fold flattening    CN VIII normal hearing to speech, opened eyes to my voice    CN IX & X normal palatal elevation, no uvular deviation    CN XI  unable to assess   CN XII midline tongue protrusion     Motor:  She moves all extremities spontaneously, follows commands, but gives  poor effort throughout.  She has increased tone in her left arm keeping her left hand clenched.     Sensation: No deficits noted on exam  Light touch Pt awoke to my voice and again to my touch on her arm   Pin prick Pt grimaced and moved arm/leg away after pin prick   Coordination/Complex Motor:  -No clear ataxia    Labs     Component Value Date/Time   WBC 6.2 04/24/2022 0214   RBC 3.53 (L) 04/24/2022 0214   HGB 9.7 (L) 04/24/2022 0214   HCT 30.0 (L) 04/24/2022 0214   PLT 247 04/24/2022 0214   MCV 85.0 04/24/2022 0214   MCH 27.5 04/24/2022 0214   MCHC 32.3 04/24/2022 0214   RDW 14.0 04/24/2022 0214   LYMPHSABS 0.8 04/22/2022 1515   MONOABS 0.7 04/22/2022 1515   EOSABS 0.0 04/22/2022 1515   BASOSABS 0.0 04/22/2022 1515       Component Value Date/Time   NA 134 (L) 04/24/2022 0214   K 3.8 04/24/2022 0214   CL 102 04/24/2022 0214   CO2 23 04/24/2022 0214   GLUCOSE 179 (H) 04/24/2022 0214   BUN 15 04/24/2022 0214   CREATININE 1.30 (H) 04/24/2022 0214   CALCIUM 8.7 (L) 04/24/2022 0214   PROT 6.3 (L) 04/23/2022 0148   ALBUMIN 2.7 (L) 04/24/2022 0214   AST 25 04/23/2022 0148  ALT 14 04/23/2022 0148   ALKPHOS 50 04/23/2022 0148   BILITOT 0.7 04/23/2022 0148   GFRNONAA 41 (L) 04/24/2022 0214    No results found for: "CHOL", "TRIG", "HDL", "CHOLHDL", "VLDL", "LDLCALC", "LDLDIRECT"  Medications: Scheduled Meds:  heparin  5,000 Units Subcutaneous Q8H   insulin aspart  0-15 Units Subcutaneous TID WC   lidocaine (PF)  6 mL Subcutaneous Once   mouth rinse  15 mL Mouth Rinse BID   Continuous Infusions:  acyclovir 605 mg (04/23/22 1622)   [START ON 04/25/2022] fluconazole (DIFLUCAN) IV     lactated ringers 100 mL/hr at 04/24/22 1323   PRN Meds:.acetaminophen **OR** acetaminophen, ondansetron **OR** ondansetron (ZOFRAN) IV  Imaging I have reviewed images in epic and the results pertinent to this consultation are: CT Head without contrast: No acute intracranial  abnormality, stable atrophy and severe chronic microvascular ischemic changes   MRI Brain:  no acute intracranial abnormality, generalized cerebral volume loss and finding of chronic ischemic microangiopathy   rEEG: Suggestive of mild to moderate diffuse encephalopathy, nonspecific etiology but likely related to toxic-metabolic causes.  No seizures or definite epileptiform discharges were seen throughout the recording  Assessment: Holly Valdez is a 86 y.o. female PMHx significant for  has a past medical history of Dementia ([place]), Diabetes mellitus without complication ([place]), and Hypertension.  Recent hospitalization from 5/23 through 26 for acute metabolic encephalopathy in setting of UTI, dehydration and possible anoxic brain injury as noted on MRI 04/05/22  She presents from SNF with worsening mental status for about 2 hours prior to presentation.  CT head and MRI brain negative for acute acute abnormality.  EEG-intermittent slow, generalized, no seizures or definite epileptiform discharges were seen.   Yesterday Dr Leonel Ramsay spoke with her caregiver at length.  She has had dementia for quite some time, however after she got COVID approximately 2 years ago she had a fairly abrupt worsening.  She describes it as "like her mind is being gone" ever since.  She has had a steady progression getting worse and worse over time, has not been able to walk for quite some time.  She was, however, still able to have a conversation and feed her self, etc. up until she came to the hospital on May 23.  Without hospitalization she came in severely hypotensive and has been encephalopathic ever since.  An MRI at that time demonstrated evidence of hypoperfusion brain injury.  She has not significantly improved per her caregiver.  She has been holding her left hand kind of clenched and has not been able to have a conversation since that time.  Apparently she had some sort of worsening of her mental status which  precipitated a reevaluation and readmission.  It appears she still has some degree of urinary tract infection and she is being treated for this.  An MRI of her brain was performed which demonstrates improvement in the diffusion restriction that was seen a few weeks ago.   Impression: Patient with hx of severe dementia with recent admission for encephalopathy 04/05/22-04/08/22 in setting of UTI. As previously discussed, low suspicion for CNS with nonspecific elevated protein. HSV PCR still pending although low suspicion. She was febrile on admission, and likely what ever caused her fever is what caused her mental status to worsen.  Her hypoxic insult from late May coupled with her underlying dementia has been a major setback for her, and likely she will continue to gradually decline given her dementia.   Palliative care has been  consulted, and I think that this is very reasonable.  Recommendations: 1) continue treatment of underlying medical stressors including possible UTI 2) given the elevated protein it is not unreasonable to continue acyclovir pending HSV PCR but my suspicion for this is very low 3) if she continues to have episodic mental status changes, could consider seizures as an etiology, but I have very little reason to suspect that at this time. 4) agree with palliative care consultation and family to discuss amongst themselves and decide GOC today  Neurology will sign off with above recommendations.   Thank you for the opportunity to take part in the care of this patient. If you have any further questions, please contact the neurology consultation attending.  Electronically signed by:  Parke Poisson, Neurology NP Can be reached on Epic Secure Messenger 04/24/2022, 11:01 AM  If 7pm- 7am, please page neurology on call as listed in [place].

## 2022-04-25 DIAGNOSIS — R6510 Systemic inflammatory response syndrome (SIRS) of non-infectious origin without acute organ dysfunction: Secondary | ICD-10-CM | POA: Diagnosis not present

## 2022-04-25 DIAGNOSIS — G9341 Metabolic encephalopathy: Secondary | ICD-10-CM | POA: Diagnosis not present

## 2022-04-25 DIAGNOSIS — F03918 Unspecified dementia, unspecified severity, with other behavioral disturbance: Secondary | ICD-10-CM | POA: Diagnosis not present

## 2022-04-25 DIAGNOSIS — N1832 Chronic kidney disease, stage 3b: Secondary | ICD-10-CM | POA: Diagnosis not present

## 2022-04-25 LAB — CBC
HCT: 30.6 % — ABNORMAL LOW (ref 36.0–46.0)
Hemoglobin: 10 g/dL — ABNORMAL LOW (ref 12.0–15.0)
MCH: 27.5 pg (ref 26.0–34.0)
MCHC: 32.7 g/dL (ref 30.0–36.0)
MCV: 84.3 fL (ref 80.0–100.0)
Platelets: 247 10*3/uL (ref 150–400)
RBC: 3.63 MIL/uL — ABNORMAL LOW (ref 3.87–5.11)
RDW: 13.8 % (ref 11.5–15.5)
WBC: 5.7 10*3/uL (ref 4.0–10.5)
nRBC: 0 % (ref 0.0–0.2)

## 2022-04-25 LAB — MAGNESIUM: Magnesium: 2 mg/dL (ref 1.7–2.4)

## 2022-04-25 LAB — RENAL FUNCTION PANEL
Albumin: 2.8 g/dL — ABNORMAL LOW (ref 3.5–5.0)
Anion gap: 6 (ref 5–15)
BUN: 9 mg/dL (ref 8–23)
CO2: 25 mmol/L (ref 22–32)
Calcium: 9 mg/dL (ref 8.9–10.3)
Chloride: 107 mmol/L (ref 98–111)
Creatinine, Ser: 1.23 mg/dL — ABNORMAL HIGH (ref 0.44–1.00)
GFR, Estimated: 44 mL/min — ABNORMAL LOW (ref >60)
Glucose, Bld: 175 mg/dL — ABNORMAL HIGH (ref 70–99)
Phosphorus: 2.9 mg/dL (ref 2.5–4.6)
Potassium: 3.8 mmol/L (ref 3.5–5.1)
Sodium: 138 mmol/L (ref 135–145)

## 2022-04-25 LAB — HSV 1/2 PCR, CSF
HSV-1 DNA: NEGATIVE
HSV-2 DNA: NEGATIVE

## 2022-04-25 LAB — GLUCOSE, CAPILLARY
Glucose-Capillary: 168 mg/dL — ABNORMAL HIGH (ref 70–99)
Glucose-Capillary: 158 mg/dL — ABNORMAL HIGH (ref 70–99)
Glucose-Capillary: 250 mg/dL — ABNORMAL HIGH (ref 70–99)
Glucose-Capillary: 240 mg/dL — ABNORMAL HIGH (ref 70–99)

## 2022-04-25 NOTE — TOC Initial Note (Signed)
Transition of Care [facility]) - Initial/Assessment Note    Patient Details  Name: Holly Valdez MRN: [id number] Date of Birth: 1936-10-14  [facility] CM/SW Contact:    Benard Halsted, LCSW Phone Number: 04/25/2022, 9:11 AM  Clinical Narrative:                 CSW received consult for hospice services to follow patient when she returns to [facility] as requested by York Grice. [facility] is able to accept patient back with hospice. CSW will follow up on if they contract with a specific agency.   Expected Discharge Plan: Skilled Nursing Facility Barriers to Discharge: Continued Medical Work up   Patient Goals and CMS Choice Patient states their goals for this hospitalization and ongoing recovery are:: Hospice at [email or web address] Compare Post Acute Care list provided to:: Patient Represenative (must comment) Choice offered to / list presented to : [place] / Guardian  Expected Discharge Plan and Services Expected Discharge Plan: [place] In-house Referral: Clinical Social Work   Post Acute Care Choice: [place] arrangements for the past 2 months: [place]                                      Prior Living Arrangements/Services Living arrangements for the past 2 months: [place] Lives with:: Facility Resident Patient language and need for interpreter reviewed:: Yes Do you feel safe going back to the place where you live?: Yes      Need for Family Participation in Patient Care: Yes (Comment) Care giver support system in place?: Yes (comment)   Criminal Activity/Legal Involvement Pertinent to Current Situation/Hospitalization: No - Comment as needed  Activities of Daily Living      Permission Sought/Granted Permission sought to share information with : Facility Sport and exercise psychologist, Family Supports Permission granted to share information with : No  Share  Information with NAME: Futures trader granted to share info w AGENCY: [employer] granted to share info w Relationship: HCPOA  Permission granted to share info w Contact Information: [phone]  Emotional Assessment Appearance:: Appears stated age Attitude/Demeanor/Rapport: Unable to Assess Affect (typically observed): Unable to Assess Orientation: : Oriented to Self Alcohol / Substance Use: Not Applicable Psych Involvement: No (comment)  Admission diagnosis:  Acute encephalopathy [G93.40] Urinary tract infection without hematuria, site unspecified [Y77.4] Acute metabolic encephalopathy [J28.78] Patient Active Problem List   Diagnosis Date Noted   Hypomagnesemia 04/24/2022   Obesity (BMI 30-39.9) 04/23/2022   Hyponatremia 04/23/2022   SIRS (systemic inflammatory response syndrome) (HCC) 04/22/2022   Goals of care, counseling/discussion 67/67/2094   Acute metabolic encephalopathy 70/96/2836   Lactic acidosis 62/94/7654   Complicated UTI (urinary tract infection) 04/05/2022   Anoxic brain injury ([place]) 04/05/2022   Elevated troponin level not due myocardial infarction 04/05/2022   Hypovolemia 10/11/2020   Uncontrolled type 2 diabetes mellitus with hyperglycemia ([place]) 10/11/2020   CKD-3B 10/11/2020   CKD (chronic kidney disease) stage 3, GFR 30-59 ml/min ([place]) 10/11/2020   Dementia with behavioral disturbance ([place]) 10/11/2020   UTI (urinary tract infection) 10/10/2020   Spinal stenosis 06/14/2019   Anemia in chronic kidney disease 01/15/2019   Diabetic polyneuropathy associated with diabetes mellitus due to underlying condition ([place]) 05/29/2018   Benign essential hypertension 02/20/2018   Diabetic retinopathy ([place]) 02/20/2018   Hyperlipidemia, unspecified 01/17/2017   Hypertrophic  non-obstructive cardiomyopathy ([place]) 04/05/2013   PCP:  Lucianne Lei, MD Pharmacy:   [id number] - [place], [place] - [address] [place] [address] Phone: [phone] Fax: [phone]     Social Determinants of Health (SDOH) Interventions    Readmission Risk Interventions    10/14/2020   10:15 AM  Readmission Risk Prevention Plan  Post Dischage Appt Complete  Medication Screening Complete  Transportation Screening Complete

## 2022-04-25 NOTE — Progress Notes (Signed)
PROGRESS NOTE  Holly Valdez RKY:[id number] DOB: 09/18/1936   PCP: Lucianne Lei, MD  Patient is from: SNF  DOA: 04/22/2022 LOS: 2  Chief complaints Chief Complaint  Patient presents with   Altered Mental Status     Brief Narrative / Interim history: 86 year old F with PMH of severe dementia, CKD-3B, DM-2 with neuropathy, HTN, chronic lower back pain, recent hospitalization from 5/23-26 for acute metabolic encephalopathy in the setting of UTI, dehydration and possible anoxic brain injury as noted on MRI, returning from SNF with worsening mental status for about 2 hours prior to presentation.  She was febrile to 102.2 F but no leukocytosis.  UA with pyuria and rare bacteria.  MRI brain without acute finding.  Cultures obtained.  LP ordered.  Started on vancomycin and cefepime, and admitted for acute metabolic encephalopathy.  The next day, neurology consulted.  Patient underwent fluoroscopy guided lumbar puncture. CSF studies ruled out bacterial or HSV meningitis.  Blood cultures NGTD.  Urine culture with 60,000 colonies of yeast.  Diflucan added.  Antibiotics and acyclovir discontinued.  EEG negative for seizure or epileptiform discharge.   Patient's encephalopathy improved.  She is awake, alert and very interactive but only oriented to self which seems to be her baseline. Neurology signed off.  Palliative medicine recommended return to SNF with hospice follow-up.  She could go back on 6/13.  Subjective: Seen and examined earlier this morning.  No major events overnight of this morning.  No complaints.  She is awake and alert but only oriented to self.  She follows command.  She has a good sense of humor.  Objective: Vitals:   04/25/22 0404 04/25/22 0800 04/25/22 1200 04/25/22 1611  BP: (!) 161/70 (!) 151/51 129/72 (!) 153/58  Pulse: 66 60 63 64  Resp: 17 12 12 16   Temp: 97.8 F (36.6 C)  97.9 F (36.6 C) 98 F (36.7 C)  TempSrc: Oral  Oral Oral  SpO2:      Weight:      Height:         Examination:  GENERAL: No apparent distress.  Nontoxic. HEENT: MMM.  Vision and hearing grossly intact.  NECK: Supple.  No apparent JVD.  RESP:  No IWOB.  Fair aeration bilaterally. CVS:  RRR. Heart sounds normal.  ABD/GI/GU: BS+. Abd soft, NTND.  MSK/EXT:  Moves extremities. No apparent deformity. No edema.  SKIN: no apparent skin lesion or wound NEURO: Awake and alert. Oriented only to self.  Follows commands.  No insight but she has a good sense of humor.  No apparent focal neuro deficit. PSYCH: Calm. Normal affect.   Procedures:  6/10-fluoroscopy guided LP  Microbiology summarized: 6/9-COVID-19 and influenza PCR nonreactive 6/9-blood cultures NGTD 6/9-urine culture with 60k colonies of yeast 6/9-MRSA PCR screen negative. 6/10-CSF culture NGTD. 6/10-CSF HSV 1 and 2 PCR nonreactive.  Assessment and plan: Principal Problem:   Acute metabolic encephalopathy Active Problems:   Goals of care, counseling/discussion   CKD-3B   Anoxic brain injury ([place])   Uncontrolled type 2 diabetes mellitus with hyperglycemia (HCC)   Dementia with behavioral disturbance (HCC)   Anemia in chronic kidney disease   SIRS (systemic inflammatory response syndrome) (HCC)   Obesity (BMI 30-39.9)   Hyponatremia   Hypomagnesemia  Acute metabolic encephalopathy in patient with history of severe dementia: unclear etiology yet.  She had fever but no leukocytosis on presentation.  Infectious work-up ruled out bacteremia, meningitis, pneumonia and UTI except for some yeast in the urine culture probably  from antibiotics for recurrent UTI.   MRI brain this admission without acute finding.  B12, TSH and ammonia normal.  EEG negative for seizure or epileptiform discharge.  Fortunately, encephalopathy resolved.  She is awake and alert but only oriented to self which seems to be her baseline.  She has no insight into why she is in the hospital but very interactive with a good sense of humor. -S/p lumbar  puncture on 6/10 -Discontinue acyclovir. -Continue Diflucan 200 mg daily for 1 week for possible candiduria -Upgraded to regular diet by SLP. -Aspiration precaution. -Reorientation and delirium precautions -Palliative medicine recommended hospice follow-up at SNF. -Discharged to SNF on 6/13  Goals of care, counseling/discussion:  Initially, her son in a different state claimed HCPOA.  However, her caregiver later stated that he is her step son, and she is HCPOA.  She says she has papers that she she can bring.  Both agrees on DNR/DNI and hospice follow-up at SNF. -Appreciate help by palliative medicine   CKD-3B: Stable. Recent Labs    04/05/22 0048 04/05/22 0056 04/06/22 0254 04/07/22 0508 04/22/22 1515 04/23/22 0148 04/24/22 0214 04/25/22 0826  BUN 31* 30* 21 12 16 16 15 9   CREATININE 2.09* 2.10* 1.50* 1.31* 1.38* 1.31* 1.30* 1.23*  -Monitor off IV fluid.   SIRS (systemic inflammatory response syndrome) ([place]): had fever, mild tachycardia and mild tachypnea on presentation.  Infectious work-up unrevealing except for some yeast in the urine.  SIRS resolved. -See encephalopathy   Anemia in chronic kidney disease: Stable. Recent Labs    04/05/22 0048 04/05/22 0056 04/05/22 0057 04/06/22 0254 04/07/22 0508 04/22/22 1515 04/23/22 0148 04/24/22 0214 04/25/22 0826  HGB 9.7* 10.2* 10.2* 9.6* 11.0* 11.7* 10.9* 9.7* 10.0*  -Recheck CBC in 1 week   Dementia with behavioral disturbance ([place]): Behavioral disturbance resolved.  -See encephalopathy   Uncontrolled type 2 diabetes mellitus with hyperglycemia ([place]): A1c 9.9% 2 weeks ago.  She has hyperglycemia but not in hyperglycemic crisis. Recent Labs  Lab 04/24/22 1628 04/24/22 1941 04/25/22 0853 04/25/22 1233 04/25/22 1609  GLUCAP 296* 109* 168* 158* 250*  -Continue SSI-moderate -Consider adding basal if CBG remains elevated  Hypomagnesemia: Resolved.  Obesity Body mass index is 32.94 kg/m.         DVT  prophylaxis:  Place and maintain sequential compression device Start: 04/23/22 0738 heparin injection 5,000 Units Start: 04/22/22 2200  Code Status: DNR/DNI Family Communication: None at bedside today. Level of care: Med-Surg Status is: Inpatient The patient will remain inpatient because: Safe disposition.   Final disposition: SNF/LTC with hospice follow-up on 6/13 Consultants:  Neurology-signed off Palliative medicine  Sch Meds:  Scheduled Meds:  heparin  5,000 Units Subcutaneous Q8H   insulin aspart  0-15 Units Subcutaneous TID WC   lidocaine (PF)  6 mL Subcutaneous Once   mouth rinse  15 mL Mouth Rinse BID   Continuous Infusions:  fluconazole (DIFLUCAN) IV 200 mg (04/25/22 1620)   PRN Meds:.acetaminophen **OR** acetaminophen, ondansetron **OR** ondansetron (ZOFRAN) IV  Antimicrobials: Anti-infectives (From admission, onward)    Start     Dose/Rate Route Frequency Ordered Stop   04/25/22 1430  fluconazole (DIFLUCAN) IVPB 200 mg        200 mg 100 mL/hr over 60 Minutes Intravenous Every 24 hours 04/24/22 1351 04/30/22 1429   04/24/22 1400  vancomycin (VANCOREADY) IVPB 1750 mg/350 mL  Status:  Discontinued        1,750 mg 175 mL/hr over 120 Minutes Intravenous Every 48 hours 04/22/22 1638  04/24/22 1045   04/23/22 1525  ceFEPIme (MAXIPIME) 2 g in sodium chloride 0.9 % 100 mL IVPB  Status:  Discontinued        2 g 200 mL/hr over 30 Minutes Intravenous Every 24 hours 04/22/22 1638 04/22/22 1738   04/23/22 1430  fluconazole (DIFLUCAN) IVPB 200 mg  Status:  Discontinued        200 mg 100 mL/hr over 60 Minutes Intravenous Every 24 hours 04/23/22 1337 04/24/22 1351   04/22/22 2200  cefTRIAXone (ROCEPHIN) 2 g in sodium chloride 0.9 % 100 mL IVPB  Status:  Discontinued        2 g 200 mL/hr over 30 Minutes Intravenous Every 12 hours 04/22/22 1738 04/24/22 1045   04/22/22 1800  acyclovir (ZOVIRAX) 605 mg in dextrose 5 % 100 mL IVPB  Status:  Discontinued        10 mg/kg  60.4 kg  (Adjusted) 112.1 mL/hr over 60 Minutes Intravenous Every 24 hours 04/22/22 1752 04/25/22 1528   04/22/22 1345  vancomycin (VANCOREADY) IVPB 1750 mg/350 mL        1,750 mg 175 mL/hr over 120 Minutes Intravenous  Once 04/22/22 1331 04/22/22 1822   04/22/22 1345  ceFEPIme (MAXIPIME) 2 g in sodium chloride 0.9 % 100 mL IVPB  Status:  Discontinued        2 g 200 mL/hr over 30 Minutes Intravenous Every 12 hours 04/22/22 1331 04/22/22 1638   04/22/22 1330  ceFEPIme (MAXIPIME) 2 g in sodium chloride 0.9 % 100 mL IVPB  Status:  Discontinued        2 g 200 mL/hr over 30 Minutes Intravenous  Once 04/22/22 1326 04/22/22 1331        I have personally reviewed the following labs and images: CBC: Recent Labs  Lab 04/22/22 1515 04/23/22 0148 04/24/22 0214 04/25/22 0826  WBC 9.9 8.2 6.2 5.7  NEUTROABS 8.3*  --   --   --   HGB 11.7* 10.9* 9.7* 10.0*  HCT 36.1 33.3* 30.0* 30.6*  MCV 86.8 85.2 85.0 84.3  PLT 298 258 247 247   BMP &GFR Recent Labs  Lab 04/22/22 1515 04/23/22 0148 04/24/22 0214 04/25/22 0826  NA 135 133* 134* 138  K 4.6 4.3 3.8 3.8  CL 101 103 102 107  CO2 25 20* 23 25  GLUCOSE 275* 239* 179* 175*  BUN 16 16 15 9   CREATININE 1.38* 1.31* 1.30* 1.23*  CALCIUM 9.3 8.7* 8.7* 9.0  MG  --   --  1.6* 2.0  PHOS  --   --  2.5 2.9   Estimated Creatinine Clearance: 34.3 mL/min (A) (by C-G formula based on SCr of 1.23 mg/dL (H)). Liver & Pancreas: Recent Labs  Lab 04/22/22 1515 04/23/22 0148 04/24/22 0214 04/25/22 0826  AST 19 25  --   --   ALT 17 14  --   --   ALKPHOS 58 50  --   --   BILITOT 0.6 0.7  --   --   PROT 6.9 6.3*  --   --   ALBUMIN 3.4* 3.0* 2.7* 2.8*   Recent Labs  Lab 04/22/22 1515  LIPASE 35   Recent Labs  Lab 04/22/22 1515  AMMONIA <10   Diabetic: No results for input(s): "HGBA1C" in the last 72 hours. Recent Labs  Lab 04/24/22 1628 04/24/22 1941 04/25/22 0853 04/25/22 1233 04/25/22 1609  GLUCAP 296* 109* 168* 158* 250*   Cardiac  Enzymes: No results for input(s): "CKTOTAL", "CKMB", "CKMBINDEX", "  TROPONINI" in the last 168 hours. No results for input(s): "PROBNP" in the last 8760 hours. Coagulation Profile: Recent Labs  Lab 04/22/22 1515  INR 1.1   Thyroid Function Tests: No results for input(s): "TSH", "T4TOTAL", "FREET4", "T3FREE", "THYROIDAB" in the last 72 hours. Lipid Profile: No results for input(s): "CHOL", "HDL", "LDLCALC", "TRIG", "CHOLHDL", "LDLDIRECT" in the last 72 hours. Anemia Panel: Recent Labs    04/22/22 2112  VITAMINB12 601   Urine analysis:    Component Value Date/Time   COLORURINE YELLOW 04/22/2022 1600   APPEARANCEUR CLOUDY (A) 04/22/2022 1600   LABSPEC 1.020 04/22/2022 1600   PHURINE 6.0 04/22/2022 1600   GLUCOSEU >=500 (A) 04/22/2022 1600   HGBUR NEGATIVE 04/22/2022 1600   BILIRUBINUR NEGATIVE 04/22/2022 1600   KETONESUR NEGATIVE 04/22/2022 1600   PROTEINUR 100 (A) 04/22/2022 1600   NITRITE NEGATIVE 04/22/2022 1600   LEUKOCYTESUR LARGE (A) 04/22/2022 1600   Sepsis Labs: Invalid input(s): "PROCALCITONIN", "LACTICIDVEN"  Microbiology: Recent Results (from the past 240 hour(s))  Urine Culture     Status: Abnormal   Collection Time: 04/22/22  1:12 PM   Specimen: Urine, Clean Catch  Result Value Ref Range Status   Specimen Description URINE, CLEAN CATCH  Final   Special Requests NONE  Final   Culture (A)  Final    60,000 COLONIES/mL YEAST WITHIN MIXED ORGANISMS Performed at [facility], [address] [place], [place] [address]    Report Status 04/23/2022 FINAL  Final  Blood culture (routine x 2)     Status: None (Preliminary result)   Collection Time: 04/22/22  1:12 PM   Specimen: BLOOD  Result Value Ref Range Status   Specimen Description BLOOD BLOOD LEFT ARM  Final   Special Requests   Final    BOTTLES DRAWN AEROBIC AND ANAEROBIC Blood Culture results may not be optimal due to an inadequate volume of blood received in culture bottles   Culture   Final     NO GROWTH 3 DAYS Performed at [facility], [place] [address] [place], [place] [address]    Report Status PENDING  Incomplete  Resp Panel by RT-PCR (Flu A&B, Covid)     Status: None   Collection Time: 04/22/22  1:26 PM   Specimen: Nasal Swab  Result Value Ref Range Status   SARS Coronavirus 2 by RT PCR NEGATIVE NEGATIVE Final    Comment: (NOTE) SARS-CoV-2 target nucleic acids are NOT DETECTED.  The SARS-CoV-2 RNA is generally detectable in upper respiratory specimens during the acute phase of infection. The lowest concentration of SARS-CoV-2 viral copies this assay can detect is 138 copies/mL. A negative result does not preclude SARS-Cov-2 infection and should not be used as the sole basis for treatment or other patient management decisions. A negative result may occur with  improper specimen collection/handling, submission of specimen other than nasopharyngeal swab, presence of viral mutation(s) within the areas targeted by this assay, and inadequate number of viral copies(<138 copies/mL). A negative result must be combined with clinical observations, patient history, and epidemiological information. The expected result is Negative.  Fact Sheet for Patients:  EntrepreneurPulse.com.au  Fact Sheet for Healthcare Providers:  IncredibleEmployment.be  This test is no t yet approved or cleared by the [place] FDA and  has been authorized for detection and/or diagnosis of SARS-CoV-2 by FDA under an Emergency Use Authorization (EUA). This EUA will remain  in effect (meaning this test can be used) for the duration of the COVID-19 declaration under Section  564(b)(1) of the Act, 21 U.S.C.section 360bbb-3(b)(1), unless the authorization is terminated  or revoked sooner.       Influenza A by PCR NEGATIVE NEGATIVE Final   Influenza B by PCR NEGATIVE NEGATIVE Final    Comment: (NOTE) The Xpert Xpress SARS-CoV-2/FLU/RSV plus assay is intended  as an aid in the diagnosis of influenza from Nasopharyngeal swab specimens and should not be used as a sole basis for treatment. Nasal washings and aspirates are unacceptable for Xpert Xpress SARS-CoV-2/FLU/RSV testing.  Fact Sheet for Patients: EntrepreneurPulse.com.au  Fact Sheet for Healthcare Providers: IncredibleEmployment.be  This test is not yet approved or cleared by the [place] FDA and has been authorized for detection and/or diagnosis of SARS-CoV-2 by FDA under an Emergency Use Authorization (EUA). This EUA will remain in effect (meaning this test can be used) for the duration of the COVID-19 declaration under Section 564(b)(1) of the Act, 21 U.S.C. section 360bbb-3(b)(1), unless the authorization is terminated or revoked.  Performed at [facility], [place] [address] [place], [place] [address]   MRSA Next Gen by PCR, Nasal     Status: None   Collection Time: 04/22/22  1:46 PM   Specimen: Nasal Swab  Result Value Ref Range Status   MRSA by PCR Next Gen NOT DETECTED NOT DETECTED Final    Comment: (NOTE) The GeneXpert MRSA Assay (FDA approved for NASAL specimens only), is one component of a comprehensive MRSA colonization surveillance program. It is not intended to diagnose MRSA infection nor to guide or monitor treatment for MRSA infections. Test performance is not FDA approved in patients less than 10 years old. Performed at [facility], [place] [address] [place], [place] [address]   Blood culture (routine x 2)     Status: None (Preliminary result)   Collection Time: 04/22/22  2:08 PM   Specimen: BLOOD  Result Value Ref Range Status   Specimen Description BLOOD BLOOD RIGHT ARM  Final   Special Requests   Final    BOTTLES DRAWN AEROBIC AND ANAEROBIC Blood Culture results may not be optimal due to an inadequate volume of blood received in culture bottles   Culture   Final    NO GROWTH 3 DAYS Performed at [facility], [place] [address] [place], [place] [address]    Report Status PENDING  Incomplete  CSF culture w Gram Stain     Status: None (Preliminary result)   Collection Time: 04/23/22 12:29 PM   Specimen: PATH Cytology CSF; Cerebrospinal Fluid  Result Value Ref Range Status   Specimen Description CSF  Final   Special Requests NONE  Final   Gram Stain   Final    WBC PRESENT, PREDOMINANTLY PMN NO ORGANISMS SEEN CYTOSPIN SMEAR    Culture   Final    NO GROWTH 2 DAYS Performed at [facility], [place] [address], [place], [place] [address]    Report Status PENDING  Incomplete    Radiology Studies: No results found.    Elieser Tetrick T. Julian  If 7PM-7AM, please contact night-coverage [email or web address] 04/25/2022, 5:14 PM

## 2022-04-26 DIAGNOSIS — N1832 Chronic kidney disease, stage 3b: Secondary | ICD-10-CM | POA: Diagnosis not present

## 2022-04-26 DIAGNOSIS — F03918 Unspecified dementia, unspecified severity, with other behavioral disturbance: Secondary | ICD-10-CM | POA: Diagnosis not present

## 2022-04-26 DIAGNOSIS — G9341 Metabolic encephalopathy: Secondary | ICD-10-CM | POA: Diagnosis not present

## 2022-04-26 DIAGNOSIS — Z7189 Other specified counseling: Secondary | ICD-10-CM | POA: Diagnosis not present

## 2022-04-26 LAB — GLUCOSE, CAPILLARY
Glucose-Capillary: 249 mg/dL — ABNORMAL HIGH (ref 70–99)
Glucose-Capillary: 261 mg/dL — ABNORMAL HIGH (ref 70–99)
Glucose-Capillary: 168 mg/dL — ABNORMAL HIGH (ref 70–99)

## 2022-04-26 LAB — CSF CULTURE W GRAM STAIN: Culture: NO GROWTH

## 2022-04-26 MED ORDER — AMLODIPINE BESYLATE 5 MG PO TABS: 5.0000 mg | ORAL_TABLET | Freq: Every day | ORAL | 11 refills | Status: AC

## 2022-04-26 MED ORDER — FLUCONAZOLE 200 MG PO TABS: 200.0000 mg | ORAL_TABLET | Freq: Every day | ORAL | 0 refills | Status: AC

## 2022-04-26 MED ORDER — INSULIN ASPART 100 UNIT/ML IJ SOLN: 0.0000 [IU] | Freq: Three times a day (TID) | INTRAMUSCULAR | Status: DC

## 2022-04-26 MED ORDER — INSULIN ASPART 100 UNIT/ML IJ SOLN: 3.0000 [IU] | Freq: Three times a day (TID) | INTRAMUSCULAR | Status: DC

## 2022-04-26 MED ORDER — INSULIN GLARGINE-YFGN 100 UNIT/ML ~~LOC~~ SOLN
10.0000 [IU] | Freq: Every day | SUBCUTANEOUS | Status: DC
  Administered 2022-04-26: 10 [IU] via SUBCUTANEOUS
  Filled 2022-04-26: qty 0.1

## 2022-04-26 MED ORDER — FLUCONAZOLE 200 MG PO TABS
200.0000 mg | ORAL_TABLET | Freq: Every day | ORAL | Status: DC
  Administered 2022-04-26: 200 mg via ORAL
  Filled 2022-04-26: qty 1

## 2022-04-26 MED ORDER — INSULIN ASPART 100 UNIT/ML IJ SOLN
0.0000 [IU] | Freq: Three times a day (TID) | INTRAMUSCULAR | Status: DC
  Administered 2022-04-26: 3 [IU] via SUBCUTANEOUS
  Administered 2022-04-26: 8 [IU] via SUBCUTANEOUS
  Administered 2022-04-26: 5 [IU] via SUBCUTANEOUS

## 2022-04-26 MED ORDER — ATORVASTATIN CALCIUM 20 MG PO TABS: 20.0000 mg | ORAL_TABLET | Freq: Every day | ORAL | Status: AC

## 2022-04-26 MED ORDER — INSULIN GLARGINE 100 UNIT/ML ~~LOC~~ SOLN: 13.0000 [IU] | Freq: Every day | SUBCUTANEOUS | 11 refills | Status: AC

## 2022-04-26 MED ORDER — INSULIN ASPART 100 UNIT/ML IJ SOLN
3.0000 [IU] | Freq: Three times a day (TID) | INTRAMUSCULAR | Status: DC
  Administered 2022-04-26 (×3): 3 [IU] via SUBCUTANEOUS

## 2022-04-26 MED ORDER — LOSARTAN POTASSIUM 25 MG PO TABS
25.0000 mg | ORAL_TABLET | Freq: Every day | ORAL | Status: AC
Start: 2022-04-26 — End: ?

## 2022-04-26 NOTE — Discharge Summary (Addendum)
Physician Discharge Summary  Holly Valdez IOX:[id number] DOB: Sep 21, 1936 DOA: 04/22/2022  PCP: Lucianne Lei, MD  Admit date: 04/22/2022 Discharge date: 04/26/2022 Admitted From: SNF Disposition: SNF Recommendations for Outpatient Follow-up:  Follow up with PCP in 1 week Please obtain BMP and CBC in 1 week Recommend hospice follow-up at SNF Please follow up on the following pending results: None   Discharge Condition: Stable CODE STATUS: DNR/DNI   Hospital course 86 year old F with PMH of severe dementia, CKD-3B, DM-2 with neuropathy, HTN, chronic lower back pain, recent hospitalization from 5/23-26 for acute metabolic encephalopathy in the setting of UTI, dehydration and possible anoxic brain injury as noted on MRI, returning from SNF with worsening mental status for about 2 hours prior to presentation.  She was febrile to 102.2 F but no leukocytosis.  UA with pyuria and rare bacteria.  MRI brain without acute finding.  Cultures obtained.  LP ordered.  Started on vancomycin and cefepime, and admitted for acute metabolic encephalopathy.   The next day, neurology consulted.  Patient underwent fluoroscopy guided lumbar puncture. CSF studies ruled out bacterial or HSV meningitis.  Blood cultures NGTD.  Urine culture with 60,000 colonies of yeast.  Diflucan added.  Antibiotics and acyclovir discontinued.  EEG negative for seizure or epileptiform discharge.    Patient's encephalopathy improved.  She is awake, alert but only oriented to self which seems to be her baseline. Neurology signed off.  Palliative medicine recommended return to SNF with hospice follow-up.   See individual problem list below for more.   Problems addressed during this hospitalization Principal Problem:   Acute metabolic encephalopathy Active Problems:   Goals of care, counseling/discussion   CKD-3B   Anoxic brain injury ([place])   Uncontrolled type 2 diabetes mellitus with hyperglycemia (HCC)   Dementia with  behavioral disturbance (HCC)   Anemia in chronic kidney disease   SIRS (systemic inflammatory response syndrome) (HCC)   Obesity (BMI 30-39.9)   Hyponatremia   Hypomagnesemia   Acute metabolic encephalopathy in patient with history of severe dementia: unclear etiology yet.  She had fever but no leukocytosis on presentation.  Infectious work-up ruled out bacteremia, meningitis, pneumonia and UTI except for some yeast in the urine culture probably from antibiotics for recurrent UTI.  MRI brain this admission without acute finding.  B12, TSH and ammonia normal.  EEG negative for seizure or epileptiform discharge.  Fortunately, encephalopathy resolved.  She is awake, alert and follows commands but only oriented to self which seems to be her baseline.  -S/p lumbar puncture on 6/10 -Antibiotics and acyclovir discontinued. -Continue Diflucan 200 mg daily for 4 more days to complete a total of 1 week -Upgraded to regular diet by SLP. -Aspiration precaution. -Reorientation and delirium precautions -Palliative medicine recommended hospice follow-up at SNF. -Discharged to SNF on 6/13   Goals of care, counseling/discussion:  Initially, her son in a different state claimed HCPOA.  However, her caregiver later stated that he is her step son, and she is HCPOA.  She says she has papers that she she can bring.  Both agrees on DNR/DNI and hospice follow-up at SNF. -Appreciate help by palliative medicine   CKD-3B: Stable. Recent Labs (within last 365 days)            Recent Labs    04/05/22 0048 04/05/22 0056 04/06/22 0254 04/07/22 0508 04/22/22 1515 04/23/22 0148 04/24/22 0214 04/25/22 0826  BUN 31* 30* 21 12 16 16 15 9   CREATININE 2.09* 2.10* 1.50* 1.31* 1.38* 1.31*  1.30* 1.23*    -Decrease losartan from 100 to 25 mg daily -Recheck BMP in 1 week   SIRS: had fever, mild tachycardia and mild tachypnea on presentation.  Infectious work-up unrevealing except for some yeast in the urine.  SIRS  resolved. Sepsis ruled out -See encephalopathy   Anemia in chronic kidney disease: Stable. Recent Labs    04/05/22 0048 04/05/22 0056 04/05/22 0057 04/06/22 0254 04/07/22 0508 04/22/22 1515 04/23/22 0148 04/24/22 0214 04/25/22 0826  HGB 9.7* 10.2* 10.2* 9.6* 11.0* 11.7* 10.9* 9.7* 10.0*   -Recheck CBC in 1 week   Dementia with behavioral disturbance ([place]): Behavioral disturbance resolved.  -See encephalopathy   Uncontrolled type 2 diabetes mellitus with hyperglycemia ([place]): A1c 9.9% 84 weeks ago.  She has hyperglycemia but not in hyperglycemic crisis. -Continue home Semglee 13 units daily -Continue home Tradjenta -Continue SSI-sensitive -Further adjustment as appropriate. -Decreased atorvastatin to 20 mg daily -Liberated diet given advanced dementia.  Essential hypertension: Blood pressure within acceptable range without medication -Decreased losartan from 100 to 25 mg daily -Discontinued hydralazine -Added low-dose amlodipine -Reassess BP and adjust antihypertensive meds as appropriate   Hypomagnesemia: Resolved.           Vital signs Vitals:   04/25/22 0800 04/25/22 1200 04/25/22 1611 04/25/22 2052  BP: (!) 151/51 129/72 (!) 153/58 140/77  Pulse: 60 63 64 74  Temp:  97.9 F (36.6 C) 98 F (36.7 C) 98.6 F (37 C)  Resp: 12 12 16 18   Height:      Weight:      SpO2:    98%  TempSrc:  Oral Oral Oral  BMI (Calculated):         Discharge exam  GENERAL: No apparent distress.  Nontoxic. HEENT: MMM.  Vision and hearing grossly intact.  NECK: Supple.  No apparent JVD.  RESP:  No IWOB.  Fair aeration bilaterally. CVS:  RRR. Heart sounds normal.  ABD/GI/GU: BS+. Abd soft, NTND.  MSK/EXT:  Moves extremities. No apparent deformity. No edema.  SKIN: no apparent skin lesion or wound NEURO: Awake and alert. Oriented to self.  Follows commands.  Very interactive but limited insight.  No apparent focal neuro deficit. PSYCH: Calm. Normal affect.   Discharge  Instructions Discharge Instructions     Diet general   Complete by: As directed    Increase activity slowly   Complete by: As directed       Allergies as of 04/26/2022       Reactions   Penicillins Other (See Comments)   Unknown reaction - Tolerated keflex and ceftriaxone while hospitalized 09/2020        Medication List     STOP taking these medications    calcium carbonate 1500 (600 Ca) MG Tabs tablet Commonly known as: OSCAL   gabapentin 300 MG capsule Commonly known as: NEURONTIN   hydrALAZINE 25 MG tablet Commonly known as: APRESOLINE       TAKE these medications    acetaminophen 650 MG CR tablet Commonly known as: TYLENOL Take 650 mg by mouth every 6 (six) hours as needed for pain.   alum & mag hydroxide-simeth 660-630-16 MG/5ML suspension Commonly known as: MAALOX PLUS Take 10 mLs by mouth every 4 (four) hours as needed for indigestion.   amLODipine 5 MG tablet Commonly known as: NORVASC Take 1 tablet (5 mg total) by mouth daily.   atorvastatin 20 MG tablet Commonly known as: LIPITOR Take 1 tablet (20 mg total) by mouth at bedtime. What changed:  medication strength how much to take   Cholecalciferol 50 MCG (2000 UT) Caps Take 2,000 Units by mouth daily.   diclofenac Sodium 1 % Gel Commonly known as: VOLTAREN Apply 4 g topically 4 (four) times daily.   DSS 100 MG Caps Take 100 mg by mouth daily as needed (constipation).   DULoxetine HCl 30 MG Csdr Take 30 mg by mouth daily.   fluconazole 200 MG tablet Commonly known as: DIFLUCAN Take 1 tablet (200 mg total) by mouth daily. Start taking on: April 27, 2022   insulin aspart 100 UNIT/ML injection Commonly known as: novoLOG Inject 0-8 Units into the skin See admin instructions. Sliding scale  70-150 0 units  151-200 2 units  201-250 4 units  251-300 6 units  301-400 8 units If BS exceeds 200 call MD   insulin glargine 100 UNIT/ML injection Commonly known as: Semglee Inject 0.13 mLs  (13 Units total) into the skin daily. Hold if CBG < 100 or not eating What changed:  additional instructions Another medication with the same name was removed. Continue taking this medication, and follow the directions you see here.   linagliptin 5 MG Tabs tablet Commonly known as: TRADJENTA Take 5 mg by mouth daily.   losartan 25 MG tablet Commonly known as: COZAAR Take 1 tablet (25 mg total) by mouth daily. What changed:  medication strength how much to take   melatonin 3 MG Tabs tablet Take 3 mg by mouth at bedtime.   thiamine 100 MG tablet Take 1 tablet (100 mg total) by mouth daily.        Consultations: Neurology  Procedures/Studies: 6/10-lumbar puncture   DG Lumbar Puncture Fluoro Guide  Result Date: 04/23/2022 CLINICAL DATA:  Encephalopathy. Request for image guided diagnostic lumbar puncture. EXAM: DIAGNOSTIC LUMBAR PUNCTURE UNDER FLUOROSCOPIC GUIDANCE FLUOROSCOPY TIME:  Radiation Exposure Index (as provided by the fluoroscopic device): 9.3 mGy If the device does not provide the exposure index: Fluoroscopy Time (in minutes and seconds):  1 minute, 24 seconds PROCEDURE: Informed consent was obtained from the patient prior to the procedure, including potential complications of headache, allergy, and pain. With the patient prone, the lower back was prepped with Betadine. 1% Lidocaine was used for local anesthesia. Lumbar puncture was performed at the L2-L3 level using a 20 gauge needle with return of clear, colorless CSF with an opening pressure of 20 cm water. Approximally 9 ml of CSF were obtained for laboratory studies. The patient tolerated the procedure well and there were no apparent complications. IMPRESSION: Technically successful image guided lumbar puncture from the L2-L3 level as above. Procedure performed by: Ascencion Dike PA-C supervised by Dr. Lajean Manes Electronically Signed   By: Lajean Manes M.D.   On: 04/23/2022 12:48   EEG adult  Result Date:  04/23/2022 Lora Havens, MD     04/23/2022  7:48 AM Patient Name: Holly Valdez MRN: [id number] Epilepsy Attending: Lora Havens Referring Physician/Provider: Mercy Riding, MD Date: 04/23/2022 Duration: 22.24 mins Patient history: 86yo F with ams. EEG to evaluate for seizure Level of alertness: Awake, drowsy AEDs during EEG study: None Technical aspects: This EEG study was done with scalp electrodes positioned according to the 10-20 International system of electrode placement. Electrical activity was acquired at a sampling rate of 500Hz  and reviewed with a high frequency filter of 70Hz  and a low frequency filter of 1Hz . EEG data were recorded continuously and digitally stored. Description: The posterior dominant rhythm consists of 8 Hz activity of moderate  voltage (25-35 uV) seen predominantly in posterior head regions, symmetric and reactive to eye opening and eye closing. Drowsiness was characterized by attenuation of the posterior background rhythm. Sleep was characterized by vertex waves, sleep spindles (12 to 14 Hz), maximal frontocentral region.  EEG showed intermittent generalized 3 to 6 Hz theta-delta slowing, at times with triphasic morphology. Hyperventilation and photic stimulation were not performed.   ABNORMALITY - Intermittent slow, generalized IMPRESSION: This study is suggestive of mild to moderate diffuse encephalopathy, nonspecific etiology but likely related to toxic-metabolic causes. No seizures or definite epileptiform discharges were seen throughout the recording. Lora Havens   MR BRAIN WO CONTRAST  Result Date: 04/23/2022 CLINICAL DATA:  Altered mental status EXAM: MRI HEAD WITHOUT CONTRAST TECHNIQUE: Multiplanar, multiecho pulse sequences of the brain and surrounding structures were obtained without intravenous contrast. COMPARISON:  04/05/2022 FINDINGS: Brain: No acute infarct, mass effect or extra-axial collection. No acute or chronic hemorrhage. There is multifocal  hyperintense T2-weighted signal within the white matter. Generalized cerebral volume loss. Old, small right cerebellar infarct. The midline structures are normal. Vascular: Major flow voids are preserved. Skull and upper cervical spine: Normal calvarium and skull base. Visualized upper cervical spine and soft tissues are normal. Sinuses/Orbits:No paranasal sinus fluid levels or advanced mucosal thickening. No mastoid or middle ear effusion. Normal orbits. IMPRESSION: 1. No acute intracranial abnormality. 2. Generalized cerebral volume loss and findings of chronic ischemic microangiopathy. Electronically Signed   By: Ulyses Jarred M.D.   On: 04/23/2022 00:07   CT Head Wo Contrast  Result Date: 04/22/2022 CLINICAL DATA:  Delirium. EXAM: CT HEAD WITHOUT CONTRAST TECHNIQUE: Contiguous axial images were obtained from the base of the skull through the vertex without intravenous contrast. RADIATION DOSE REDUCTION: This exam was performed according to the departmental dose-optimization program which includes automated exposure control, adjustment of the mA and/or kV according to patient size and/or use of iterative reconstruction technique. COMPARISON:  MRI brain and CT head dated Apr 05, 2022. FINDINGS: Brain: No evidence of acute infarction, hemorrhage, hydrocephalus, extra-axial collection or mass lesion/mass effect. Stable atrophy and severe chronic microvascular ischemic changes. Vascular: Calcified atherosclerosis at the skull base. No hyperdense vessel. Skull: Normal. Negative for fracture or focal lesion. Sinuses/Orbits: No acute finding. Other: None. IMPRESSION: 1. No acute intracranial abnormality. 2. Stable atrophy and severe chronic microvascular ischemic changes. Electronically Signed   By: Titus Dubin M.D.   On: 04/22/2022 16:11   DG Chest Portable 1 View  Result Date: 04/22/2022 CLINICAL DATA:  Altered mental status EXAM: PORTABLE CHEST 1 VIEW COMPARISON:  04/05/2022 FINDINGS: Transverse diameter of  heart is increased. There are no signs of alveolar pulmonary edema. There is blunting of left lateral CP angle. Radiolucency overlying the cardiac shadow most likely suggests fixed hiatal hernia. IMPRESSION: Cardiomegaly. There are no signs of pulmonary edema or focal pulmonary consolidation. Blunting of left lateral CP angle may be due to pleural thickening or minimal effusion. Possible fixed hiatal hernia. Electronically Signed   By: Elmer Picker M.D.   On: 04/22/2022 13:37   CT ABDOMEN PELVIS WO CONTRAST  Result Date: 04/05/2022 CLINICAL DATA:  UTI, recurrent/complicated abdominal mass on examination. EXAM: CT ABDOMEN AND PELVIS WITHOUT CONTRAST TECHNIQUE: Multidetector CT imaging of the abdomen and pelvis was performed following the standard protocol without IV contrast. RADIATION DOSE REDUCTION: This exam was performed according to the departmental dose-optimization program which includes automated exposure control, adjustment of the mA and/or kV according to patient size and/or use of iterative  reconstruction technique. COMPARISON:  None Available. FINDINGS: Lower chest: Large hiatal hernia with adjacent atelectasis. Linear atelectasis in the right lung base. Coronary artery calcifications. Calcifications of the aortic valve. Hepatobiliary: Unremarkable noncontrast appearance of the hepatic parenchyma. Layering sludge and/or cholelithiasis without evidence of acute cholecystitis. No biliary ductal dilation. Pancreas: No pancreatic ductal dilation or evidence of acute inflammation. Spleen: No splenomegaly or focal splenic lesion. Adrenals/Urinary Tract: Bilateral adrenal glands are within normal limits. No hydronephrosis. Vascular calcifications in the bilateral renal hila. No renal, ureteral or bladder calculi identified. Left upper pole renal sinus cysts which are benign and require no follow-up. Urinary bladder is distended with urine, extending above the sacral promontory. Stomach/Bowel: Large  hiatal hernia. Stomach is unremarkable for degree of distension. No pathologic dilation of large or small bowel. Colonic diverticulosis without findings of acute diverticulitis. Moderate rectal stool burden with minimal wall thickening adjacent perirectal fat stranding. Vascular/Lymphatic: Extensive aortic and branch vessel atherosclerosis without abdominal aortic aneurysm. No pathologically enlarged abdominal lymph nodes. Reproductive: Probable prior hysterectomy. Hyperdense nodular area along the left adnexa measures approximally 3.7 cm on image 58/3 possibly reflecting bowel or a pelvic/adnexal lesion. Other: No significant abdominopelvic free fluid. Musculoskeletal: Prior L4-L5 posterior spinal fusion with advanced multilevel degenerative changes spine. IMPRESSION: 1. Urinary bladder is distended with urine, extending above the sacral promontory. Correlate for urinary retention. 2. No hydronephrosis. 3. Hyperdense nodular area along the left adnexa measures approximally 3.7 cm possibly reflecting bowel or a pelvic/adnexal lesion but incompletely evaluated on this noncontrast examination. Consider further evaluation with pelvic ultrasound. 4. Large hiatal hernia. 5. Colonic diverticulosis without findings of acute diverticulitis. 6. Layering sludge and/or cholelithiasis without evidence of acute cholecystitis. 7.  Aortic Atherosclerosis (ICD10-I70.0). Electronically Signed   By: Dahlia Bailiff M.D.   On: 04/05/2022 10:19   EEG adult  Result Date: 04/05/2022 Lora Havens, MD     04/05/2022  1:45 PM Patient Name: Holly Valdez MRN: [id number] Epilepsy Attending: Lora Havens Referring Physician/Provider: Lorenza Chick, MD Date: 04/05/2022 Duration: 22.31 mins Patient history: 86 year old woman with a past medical history of dementia and vascular risk factors as above presenting with altered mental status. EEG to evaluate for seizure Level of alertness: Awake AEDs during EEG study: None Technical  aspects: This EEG study was done with scalp electrodes positioned according to the 10-20 International system of electrode placement. Electrical activity was acquired at a sampling rate of 500Hz  and reviewed with a high frequency filter of 70Hz  and a low frequency filter of 1Hz . EEG data were recorded continuously and digitally stored. Description: No posterior dominant rhythm consists of 9-10 Hz activity of moderate voltage (25-35 uV) seen predominantly in posterior head regions, symmetric and reactive to eye opening and eye closing. EEG showed continuous generalized 3 to 6 Hz theta-delta slowing. Hyperventilation and photic stimulation were not performed.   ABNORMALITY - Continuous slow, generalized IMPRESSION: This study is suggestive of moderate diffuse encephalopathy, nonspecific etiology. No seizures or epileptiform discharges were seen throughout the recording. Lora Havens   MR BRAIN WO CONTRAST  Result Date: 04/05/2022 CLINICAL DATA:  Delirium.  Unresponsive with pinpoint pupils. EXAM: MRI HEAD WITHOUT CONTRAST TECHNIQUE: Multiplanar, multiecho pulse sequences of the brain and surrounding structures were obtained without intravenous contrast. COMPARISON:  Head CT from earlier today FINDINGS: Brain: Linear increased diffusion signal along the bilateral parietal cortex and faintly seen in the bilateral globus pallidus. No additional areas of edematous cortex to imply background posterior reversible encephalopathy syndrome.  There is a background of advanced chronic small vessel ischemia with confluent gliosis in the deep white matter. Small remote right cerebellar infarct. Cerebral volume loss in keeping with age. No hemorrhage, hydrocephalus, or masslike finding Vascular: Major flow voids are preserved. Skull and upper cervical spine: Cervical spine degeneration where covered. No focal marrow lesion. Sinuses/Orbits: Bilateral cataract resection. IMPRESSION: 1. Symmetric restricted diffusion in the  parietal cortex and basal ganglia suggesting global anoxic insult. 2. Extensive chronic small vessel ischemia. Electronically Signed   By: Jorje Guild M.D.   On: 04/05/2022 06:14   DG Abd Portable 1V  Result Date: 04/05/2022 CLINICAL DATA:  MRI clearance. EXAM: PORTABLE ABDOMEN - 1 VIEW COMPARISON:  None Available. FINDINGS: Posterior spinal fusion hardware noted at L4-5. Linear metallic foreign body overlies the L3-4 interspace. Bowel gas pattern is unremarkable. Bones are diffusely demineralized. Telemetry leads overlie the lower chest and upper abdomen. IMPRESSION: Posterior spinal fusion hardware at L4-5. Additional linear metallic foreign body at the level of the L3-4 interspace indeterminate on frontal projection, possibly a surgical clip. Lateral lumbar spine x-ray could be used to further evaluate as clinically warranted. Electronically Signed   By: Misty Stanley M.D.   On: 04/05/2022 05:26   DG Chest Port 1 View  Result Date: 04/05/2022 CLINICAL DATA:  Altered mental status. EXAM: PORTABLE CHEST 1 VIEW COMPARISON:  None Available. FINDINGS: Mild cardiomegaly with mild central vascular congestion. No focal consolidation, pleural effusion, pneumothorax. There is moderate size hiatal hernia. Atherosclerotic calcification of the aorta. Degenerative changes of spine. No acute osseous pathology. IMPRESSION: Mild cardiomegaly with mild central vascular congestion. Electronically Signed   By: Anner Crete M.D.   On: 04/05/2022 02:00   CT Head Wo Contrast  Result Date: 04/05/2022 CLINICAL DATA:  Mental status change, unknown cause EXAM: CT HEAD WITHOUT CONTRAST TECHNIQUE: Contiguous axial images were obtained from the base of the skull through the vertex without intravenous contrast. RADIATION DOSE REDUCTION: This exam was performed according to the departmental dose-optimization program which includes automated exposure control, adjustment of the mA and/or kV according to patient size and/or use  of iterative reconstruction technique. COMPARISON:  None Available. FINDINGS: Brain: Normal anatomic configuration. Parenchymal volume loss is commensurate with the patient's age. Moderate subcortical and periventricular white matter changes are present likely reflecting the sequela of small vessel ischemia. Tiny remote lacunar infarct noted within the right cerebellar hemisphere. No abnormal intra or extra-axial mass lesion or fluid collection. No abnormal mass effect or midline shift. No evidence of acute intracranial hemorrhage or infarct. Ventricular size is normal. Cerebellum unremarkable. Vascular: No asymmetric hyperdense vasculature at the skull base. Skull: Intact Sinuses/Orbits: Paranasal sinuses are clear. Ocular lenses have been removed. Orbits are otherwise unremarkable. Other: Mastoid air cells and middle ear cavities are clear. IMPRESSION: No acute intracranial abnormality.  No calvarial fracture. Electronically Signed   By: Fidela Salisbury M.D.   On: 04/05/2022 01:22       The results of significant diagnostics from this hospitalization (including imaging, microbiology, ancillary and laboratory) are listed below for reference.     Microbiology: Recent Results (from the past 240 hour(s))  Urine Culture     Status: Abnormal   Collection Time: 04/22/22  1:12 PM   Specimen: Urine, Clean Catch  Result Value Ref Range Status   Specimen Description URINE, CLEAN CATCH  Final   Special Requests NONE  Final   Culture (A)  Final    60,000 COLONIES/mL YEAST WITHIN MIXED ORGANISMS Performed at  [facility], [place] [address], [place], [place] [address]    Report Status 04/23/2022 FINAL  Final  Blood culture (routine x 2)     Status: None (Preliminary result)   Collection Time: 04/22/22  1:12 PM   Specimen: BLOOD  Result Value Ref Range Status   Specimen Description BLOOD BLOOD LEFT ARM  Final   Special Requests   Final    BOTTLES DRAWN AEROBIC AND ANAEROBIC Blood Culture results may  not be optimal due to an inadequate volume of blood received in culture bottles   Culture   Final    NO GROWTH 3 DAYS Performed at [facility], [place] [address], [place], [place] [address]    Report Status PENDING  Incomplete  Resp Panel by RT-PCR (Flu A&B, Covid)     Status: None   Collection Time: 04/22/22  1:26 PM   Specimen: Nasal Swab  Result Value Ref Range Status   SARS Coronavirus 2 by RT PCR NEGATIVE NEGATIVE Final    Comment: (NOTE) SARS-CoV-2 target nucleic acids are NOT DETECTED.  The SARS-CoV-2 RNA is generally detectable in upper respiratory specimens during the acute phase of infection. The lowest concentration of SARS-CoV-2 viral copies this assay can detect is 138 copies/mL. A negative result does not preclude SARS-Cov-2 infection and should not be used as the sole basis for treatment or other patient management decisions. A negative result may occur with  improper specimen collection/handling, submission of specimen other than nasopharyngeal swab, presence of viral mutation(s) within the areas targeted by this assay, and inadequate number of viral copies(<138 copies/mL). A negative result must be combined with clinical observations, patient history, and epidemiological information. The expected result is Negative.  Fact Sheet for Patients:  EntrepreneurPulse.com.au  Fact Sheet for Healthcare Providers:  IncredibleEmployment.be  This test is no t yet approved or cleared by the [place] FDA and  has been authorized for detection and/or diagnosis of SARS-CoV-2 by FDA under an Emergency Use Authorization (EUA). This EUA will remain  in effect (meaning this test can be used) for the duration of the COVID-19 declaration under Section 564(b)(1) of the Act, 21 U.S.C.section 360bbb-3(b)(1), unless the authorization is terminated  or revoked sooner.       Influenza A by PCR NEGATIVE NEGATIVE Final   Influenza B by PCR  NEGATIVE NEGATIVE Final    Comment: (NOTE) The Xpert Xpress SARS-CoV-2/FLU/RSV plus assay is intended as an aid in the diagnosis of influenza from Nasopharyngeal swab specimens and should not be used as a sole basis for treatment. Nasal washings and aspirates are unacceptable for Xpert Xpress SARS-CoV-2/FLU/RSV testing.  Fact Sheet for Patients: EntrepreneurPulse.com.au  Fact Sheet for Healthcare Providers: IncredibleEmployment.be  This test is not yet approved or cleared by the [place] FDA and has been authorized for detection and/or diagnosis of SARS-CoV-2 by FDA under an Emergency Use Authorization (EUA). This EUA will remain in effect (meaning this test can be used) for the duration of the COVID-19 declaration under Section 564(b)(1) of the Act, 21 U.S.C. section 360bbb-3(b)(1), unless the authorization is terminated or revoked.  Performed at [facility], [place] [address] [place], [place] [address]   MRSA Next Gen by PCR, Nasal     Status: None   Collection Time: 04/22/22  1:46 PM   Specimen: Nasal Swab  Result Value Ref Range Status   MRSA by PCR Next Gen NOT DETECTED NOT DETECTED Final    Comment: (NOTE) The GeneXpert MRSA  Assay (FDA approved for NASAL specimens only), is one component of a comprehensive MRSA colonization surveillance program. It is not intended to diagnose MRSA infection nor to guide or monitor treatment for MRSA infections. Test performance is not FDA approved in patients less than 9 years old. Performed at [facility], [place] [address] [place], [place] [address]   Blood culture (routine x 2)     Status: None (Preliminary result)   Collection Time: 04/22/22  2:08 PM   Specimen: BLOOD  Result Value Ref Range Status   Specimen Description BLOOD BLOOD RIGHT ARM  Final   Special Requests   Final    BOTTLES DRAWN AEROBIC AND ANAEROBIC Blood Culture results may not be optimal due to an inadequate  volume of blood received in culture bottles   Culture   Final    NO GROWTH 3 DAYS Performed at [facility], [place] [address], [place], [place] [address]    Report Status PENDING  Incomplete  CSF culture w Gram Stain     Status: None (Preliminary result)   Collection Time: 04/23/22 12:29 PM   Specimen: PATH Cytology CSF; Cerebrospinal Fluid  Result Value Ref Range Status   Specimen Description CSF  Final   Special Requests NONE  Final   Gram Stain   Final    WBC PRESENT, PREDOMINANTLY PMN NO ORGANISMS SEEN CYTOSPIN SMEAR    Culture   Final    NO GROWTH 2 DAYS Performed at [facility], [place] [address], [place], [place] [address]    Report Status PENDING  Incomplete     Labs:  CBC: Recent Labs  Lab 04/22/22 1515 04/23/22 0148 04/24/22 0214 04/25/22 0826  WBC 9.9 8.2 6.2 5.7  NEUTROABS 8.3*  --   --   --   HGB 11.7* 10.9* 9.7* 10.0*  HCT 36.1 33.3* 30.0* 30.6*  MCV 86.8 85.2 85.0 84.3  PLT 298 258 247 247   BMP &GFR Recent Labs  Lab 04/22/22 1515 04/23/22 0148 04/24/22 0214 04/25/22 0826  NA 135 133* 134* 138  K 4.6 4.3 3.8 3.8  CL 101 103 102 107  CO2 25 20* 23 25  GLUCOSE 275* 239* 179* 175*  BUN 16 16 15 9   CREATININE 1.38* 1.31* 1.30* 1.23*  CALCIUM 9.3 8.7* 8.7* 9.0  MG  --   --  1.6* 2.0  PHOS  --   --  2.5 2.9   Estimated Creatinine Clearance: 34.3 mL/min (A) (by C-G formula based on SCr of 1.23 mg/dL (H)). Liver & Pancreas: Recent Labs  Lab 04/22/22 1515 04/23/22 0148 04/24/22 0214 04/25/22 0826  AST 19 25  --   --   ALT 17 14  --   --   ALKPHOS 58 50  --   --   BILITOT 0.6 0.7  --   --   PROT 6.9 6.3*  --   --   ALBUMIN 3.4* 3.0* 2.7* 2.8*   Recent Labs  Lab 04/22/22 1515  LIPASE 35   Recent Labs  Lab 04/22/22 1515  AMMONIA <10   Diabetic: No results for input(s): "HGBA1C" in the last 72 hours. Recent Labs  Lab 04/25/22 0853 04/25/22 1233 04/25/22 1609 04/25/22 2159 04/26/22 0726  GLUCAP 168* 158* 250* 240*  249*   Cardiac Enzymes: No results for input(s): "CKTOTAL", "CKMB", "CKMBINDEX", "TROPONINI" in the last 168 hours. No results for input(s): "PROBNP" in the last 8760 hours. Coagulation Profile: Recent Labs  Lab 04/22/22 1515  INR 1.1   Thyroid Function Tests: No results for input(s): "TSH", "T4TOTAL", "FREET4", "T3FREE", "THYROIDAB" in the last 72 hours. Lipid Profile: No results for input(s): "CHOL", "HDL", "LDLCALC", "TRIG", "CHOLHDL", "LDLDIRECT" in the last 72 hours. Anemia Panel: No results for input(s): "VITAMINB12", "FOLATE", "FERRITIN", "TIBC", "IRON", "RETICCTPCT" in the last 72 hours. Urine analysis:    Component Value Date/Time   COLORURINE YELLOW 04/22/2022 1600   APPEARANCEUR CLOUDY (A) 04/22/2022 1600   LABSPEC 1.020 04/22/2022 1600   PHURINE 6.0 04/22/2022 1600   GLUCOSEU >=500 (A) 04/22/2022 1600   HGBUR NEGATIVE 04/22/2022 1600   BILIRUBINUR NEGATIVE 04/22/2022 1600   KETONESUR NEGATIVE 04/22/2022 1600   PROTEINUR 100 (A) 04/22/2022 1600   NITRITE NEGATIVE 04/22/2022 1600   LEUKOCYTESUR LARGE (A) 04/22/2022 1600   Sepsis Labs: Invalid input(s): "PROCALCITONIN", "LACTICIDVEN"   SIGNED:  Mercy Riding, MD  [facility] 04/26/2022, 9:09 AM

## 2022-04-26 NOTE — NC FL2 (Signed)
[place] LEVEL OF CARE SCREENING TOOL     IDENTIFICATION  Patient Name: Holly Valdez Birthdate: 1936/10/06 Sex: female Admission Date (Current Location): 04/22/2022  [place] and [place] Number:  Kathleen Argue [phone] [place] and Address:  [facility], [place] [address] [place], [place] [address]      Provider Number: [id number]  Attending Physician Name and Address:  Mercy Riding, MD  Relative Name and Phone Number:  Smith Robert - [phone]    Current Level of Care: Hospital Recommended Level of Care: [place] Prior Approval Number:    Date Approved/Denied:   PASRR Number: [id number]  Discharge Plan: SNF    Current Diagnoses: Patient Active Problem List   Diagnosis Date Noted   Hypomagnesemia 04/24/2022   Obesity (BMI 30-39.9) 04/23/2022   Hyponatremia 04/23/2022   SIRS (systemic inflammatory response syndrome) ([place]) 04/22/2022   Goals of care, counseling/discussion 84/13/2440   Acute metabolic encephalopathy 09/10/2535   Lactic acidosis 64/40/3474   Complicated UTI (urinary tract infection) 04/05/2022   Anoxic brain injury ([place]) 04/05/2022   Elevated troponin level not due myocardial infarction 04/05/2022   Hypovolemia 10/11/2020   Uncontrolled type 2 diabetes mellitus with hyperglycemia ([place]) 10/11/2020   CKD-3B 10/11/2020   CKD (chronic kidney disease) stage 3, GFR 30-59 ml/min (HCC) 10/11/2020   Dementia with behavioral disturbance ([place]) 10/11/2020   UTI (urinary tract infection) 10/10/2020   Spinal stenosis 06/14/2019   Anemia in chronic kidney disease 01/15/2019   Diabetic polyneuropathy associated with diabetes mellitus due to underlying condition ([place]) 05/29/2018   Benign essential hypertension 02/20/2018   Diabetic retinopathy ([place]) 02/20/2018   Hyperlipidemia, unspecified 01/17/2017   Hypertrophic non-obstructive cardiomyopathy ([place]) 04/05/2013    Orientation RESPIRATION BLADDER  Height & Weight     Self  Normal External catheter Weight: 81.7 kg Height:  5\' 2"  (157.5 cm)  BEHAVIORAL SYMPTOMS/MOOD NEUROLOGICAL BOWEL NUTRITION STATUS      Incontinent Diet (See Discharge Summary)  AMBULATORY STATUS COMMUNICATION OF NEEDS Skin   Total Care Verbally Normal                       Personal Care Assistance Level of Assistance  Total care       Total Care Assistance: Maximum assistance   Functional Limitations Info             SPECIAL CARE FACTORS FREQUENCY                       Contractures Contractures Info: Not present    Additional Factors Info  Insulin Sliding Scale, Code Status, Allergies Code Status Info: DNR Allergies Info: Penicillin   Insulin Sliding Scale Info: See discharge summary and orders       Current Medications (04/26/2022):  This is the current hospital active medication list Current Facility-Administered Medications  Medication Dose Route Frequency Provider Last Rate Last Admin   acetaminophen (TYLENOL) tablet 650 mg  650 mg Oral Q6H PRN Wendee Beavers T, MD       Or   acetaminophen (TYLENOL) suppository 650 mg  650 mg Rectal Q6H PRN Wendee Beavers T, MD   650 mg at 04/22/22 2139   fluconazole (DIFLUCAN) tablet 200 mg  200 mg Oral Daily Gonfa, Taye T, MD       heparin injection 5,000 Units  5,000 Units Subcutaneous Q8H Wendee Beavers T, MD   5,000 Units at 04/26/22 0507   insulin aspart (novoLOG)  injection 0-15 Units  0-15 Units Subcutaneous TID WC Gonfa, Taye T, MD   5 Units at 04/26/22 0830   insulin aspart (novoLOG) injection 3 Units  3 Units Subcutaneous TID WC Wendee Beavers T, MD   3 Units at 04/26/22 0831   insulin glargine-yfgn (SEMGLEE) injection 10 Units  10 Units Subcutaneous Daily Gonfa, Taye T, MD       lidocaine (PF) (XYLOCAINE) 1 % injection 6 mL  6 mL Subcutaneous Once Rancour, Stephen, MD       MEDLINE mouth rinse  15 mL Mouth Rinse BID Cyndia Skeeters, Taye T, MD   15 mL at 04/25/22 2156   ondansetron (ZOFRAN) tablet 4 mg   4 mg Oral Q6H PRN Mercy Riding, MD       Or   ondansetron (ZOFRAN) injection 4 mg  4 mg Intravenous Q6H PRN Mercy Riding, MD         Discharge Medications: Please see discharge summary for a list of discharge medications.  Relevant Imaging Results:  Relevant Lab Results:   Additional Information SS# [id number], Hospice care at facility to be provided through [place], RN

## 2022-04-26 NOTE — Progress Notes (Signed)
Manufacturing engineer [facility]) Hospital Liaison Note  Referral received for patient/family for interest in hospice at [facility]. [place] liaison spoke with Holly Valdez to confirm interest. Interest confirmed.   Hospice eligibility confirmed.   Plan is to discharge today back to [facility].  DME in the home: none  DME needs: none  Please send comfort medications/prescriptions with patient at discharge.   Please call with any questions or concerns. Thank you  Roselee Nova, [place] Hospital Liaison [phone]

## 2022-04-26 NOTE — Progress Notes (Signed)
Pt ready for discharge to facility. PTAR on site, discharge paperwork given, all needs addressed, belongings by pt side

## 2022-04-26 NOTE — TOC Transition Note (Signed)
Transition of Care [facility]) - CM/SW Discharge Note   Patient Details  Name: Holly Valdez MRN: [id number] Date of Birth: 1936-02-17  Transition of Care [facility]) CM/SW Contact:  Curlene Labrum, RN Phone Number: 04/26/2022, 10:56 AM   Clinical Narrative:    CM called to speak with Holly Valdez, CM at [facility]) and patient has available bed to return to the facility today for continued care.  [name] states that the facility has contract with [place], [place] but when I called to set up services - they are unable to provide service in the [place] area.  I called and spoke with the patient's son, Holly Valdez, on the phone and he appreciated hospice care services for the patient but did not have a preference for hospice provider.  I called and spoke with Holly Valdez, MSW with [facility] services and they accepted the patient for services at the facility and the facility and son are both aware.  FL2 was completed and signed by the attending physician, Dr. Cyndia Skeeters - and sent to [facility] along with Discharge summary packet and available orders and clinicals through the hub and sent by hard copy in the discharge packet to the facility.  PTAR was called and transport back to the facility was set up for 12 noon today.  Bedside nursing is aware of discharge to the facility today - Please call report to [facility] facility at [phone].  CM with DTP Team will continue to follow the patient for discharge planning and transport back to the facility today.   Final next level of care: [place] Barriers to Discharge: No Barriers Identified   Patient Goals and CMS Choice Patient states their goals for this hospitalization and ongoing recovery are:: Return to [facility] with Hospice care support through [facility] provider CMS Medicare.gov Compare Post Acute Care list provided to::  Patient Represenative (must comment) (Patient's son - Holly Valdez - [phone]) Choice offered to / list presented to : [facility] POA / Guardian  Discharge Placement  Discharge back to [place] facility with Hospice Care support through [facility]                     Discharge Plan and Services In-house Referral: Clinical Social Work Discharge Planning Services: [place] Acute Care Choice: [place]                      [place]:  ([place] through [place]) Date [place]: 04/26/22 Time [place]: 1054 Representative spoke with at [place]: Holly Valdez, MSW with [facility]  Social Determinants of Health ([place]) Interventions     Readmission Risk Interventions    10/14/2020   10:15 AM  Readmission Risk Prevention Plan  Post Dischage Appt Complete  Medication Screening Complete  Transportation Screening Complete

## 2022-04-26 NOTE — Plan of Care (Signed)
  Problem: Respiratory: Goal: Ability to maintain adequate ventilation will improve Outcome: Progressing   Problem: Activity: Goal: Risk for activity intolerance will decrease Outcome: Progressing   Problem: Nutrition: Goal: Adequate nutrition will be maintained Outcome: Progressing   Problem: Coping: Goal: Level of anxiety will decrease Outcome: Progressing

## 2022-04-27 LAB — CULTURE, BLOOD (ROUTINE X 2)
Culture: NO GROWTH
Culture: NO GROWTH

## 2022-06-14 DEATH — deceased
